# Patient Record
Sex: Male | Born: 1945 | Race: White | Hispanic: No | Smoking: Never smoker
Health system: Southern US, Community
[De-identification: ages and names within clinical notes are randomized; demographics above are authoritative.]

## PROBLEM LIST (undated history)

## (undated) DIAGNOSIS — I872 Venous insufficiency (chronic) (peripheral): Secondary | ICD-10-CM

## (undated) DIAGNOSIS — I2510 Atherosclerotic heart disease of native coronary artery without angina pectoris: Secondary | ICD-10-CM

## (undated) DIAGNOSIS — D126 Benign neoplasm of colon, unspecified: Secondary | ICD-10-CM

## (undated) DIAGNOSIS — E7800 Pure hypercholesterolemia, unspecified: Secondary | ICD-10-CM

## (undated) DIAGNOSIS — R079 Chest pain, unspecified: Secondary | ICD-10-CM

## (undated) DIAGNOSIS — Z79899 Other long term (current) drug therapy: Secondary | ICD-10-CM

## (undated) DIAGNOSIS — I5023 Acute on chronic systolic (congestive) heart failure: Secondary | ICD-10-CM

## (undated) DIAGNOSIS — E118 Type 2 diabetes mellitus with unspecified complications: Secondary | ICD-10-CM

## (undated) DIAGNOSIS — R55 Syncope and collapse: Secondary | ICD-10-CM

## (undated) DIAGNOSIS — G2581 Restless legs syndrome: Secondary | ICD-10-CM

## (undated) DIAGNOSIS — E1142 Type 2 diabetes mellitus with diabetic polyneuropathy: Secondary | ICD-10-CM

## (undated) DIAGNOSIS — I5042 Chronic combined systolic (congestive) and diastolic (congestive) heart failure: Secondary | ICD-10-CM

## (undated) DIAGNOSIS — R5381 Other malaise: Secondary | ICD-10-CM

## (undated) DIAGNOSIS — Z95810 Presence of automatic (implantable) cardiac defibrillator: Secondary | ICD-10-CM

## (undated) DIAGNOSIS — R600 Localized edema: Secondary | ICD-10-CM

## (undated) DIAGNOSIS — I509 Heart failure, unspecified: Secondary | ICD-10-CM

## (undated) DIAGNOSIS — S72143A Displaced intertrochanteric fracture of unspecified femur, initial encounter for closed fracture: Secondary | ICD-10-CM

## (undated) DIAGNOSIS — Z0189 Encounter for other specified special examinations: Secondary | ICD-10-CM

## (undated) DIAGNOSIS — E119 Type 2 diabetes mellitus without complications: Secondary | ICD-10-CM

## (undated) DIAGNOSIS — I10 Essential (primary) hypertension: Secondary | ICD-10-CM

## (undated) DIAGNOSIS — Z96649 Presence of unspecified artificial hip joint: Secondary | ICD-10-CM

## (undated) DIAGNOSIS — Z9114 Patient's other noncompliance with medication regimen: Secondary | ICD-10-CM

## (undated) DIAGNOSIS — G478 Other sleep disorders: Secondary | ICD-10-CM

## (undated) DIAGNOSIS — R609 Edema, unspecified: Secondary | ICD-10-CM

## (undated) DIAGNOSIS — M109 Gout, unspecified: Secondary | ICD-10-CM

## (undated) DIAGNOSIS — I428 Other cardiomyopathies: Secondary | ICD-10-CM

## (undated) DIAGNOSIS — F528 Other sexual dysfunction not due to a substance or known physiological condition: Secondary | ICD-10-CM

## (undated) DIAGNOSIS — S72009A Fracture of unspecified part of neck of unspecified femur, initial encounter for closed fracture: Secondary | ICD-10-CM

## (undated) DIAGNOSIS — I429 Cardiomyopathy, unspecified: Secondary | ICD-10-CM

## (undated) DIAGNOSIS — I8390 Asymptomatic varicose veins of unspecified lower extremity: Secondary | ICD-10-CM

## (undated) DIAGNOSIS — I110 Hypertensive heart disease with heart failure: Secondary | ICD-10-CM

## (undated) DIAGNOSIS — G4730 Sleep apnea, unspecified: Secondary | ICD-10-CM

## (undated) DIAGNOSIS — R0602 Shortness of breath: Secondary | ICD-10-CM

## (undated) DIAGNOSIS — L03115 Cellulitis of right lower limb: Secondary | ICD-10-CM

## (undated) DIAGNOSIS — M7989 Other specified soft tissue disorders: Secondary | ICD-10-CM

## (undated) DIAGNOSIS — Z8619 Personal history of other infectious and parasitic diseases: Secondary | ICD-10-CM

## (undated) DIAGNOSIS — B3789 Other sites of candidiasis: Secondary | ICD-10-CM

## (undated) HISTORY — DX: Cellulitis of right lower limb: L03.115

## (undated) HISTORY — DX: Acute on chronic systolic (congestive) heart failure: I50.23

## (undated) HISTORY — DX: Morbid (severe) obesity due to excess calories: E66.01

## (undated) HISTORY — DX: Chest pain, unspecified: R07.9

## (undated) HISTORY — DX: Other malaise: R53.81

## (undated) HISTORY — DX: Other specified soft tissue disorders: M79.89

## (undated) HISTORY — DX: Patient's other noncompliance with medication regimen: Z91.14

## (undated) HISTORY — DX: Personal history of other infectious and parasitic diseases: Z86.19

## (undated) HISTORY — DX: Chronic combined systolic (congestive) and diastolic (congestive) heart failure: I50.42

## (undated) HISTORY — DX: Other sexual dysfunction not due to a substance or known physiological condition: F52.8

## (undated) HISTORY — DX: Syncope and collapse: R55

## (undated) HISTORY — DX: Other cardiomyopathies: I42.8

## (undated) HISTORY — DX: Other long term (current) drug therapy: Z79.899

## (undated) HISTORY — DX: Cardiomyopathy, unspecified: I42.9

## (undated) HISTORY — DX: Localized edema: R60.0

## (undated) HISTORY — DX: Other sleep disorders: G47.8

## (undated) HISTORY — DX: Heart failure, unspecified: I50.9

## (undated) HISTORY — DX: Asymptomatic varicose veins of unspecified lower extremity: I83.90

## (undated) HISTORY — DX: Fracture of unspecified part of neck of unspecified femur, initial encounter for closed fracture: S72.009A

## (undated) HISTORY — DX: Venous insufficiency (chronic) (peripheral): I87.2

## (undated) HISTORY — DX: Presence of unspecified artificial hip joint: Z96.649

## (undated) HISTORY — DX: Essential (primary) hypertension: I10

## (undated) HISTORY — DX: Restless legs syndrome: G25.81

## (undated) HISTORY — DX: Edema, unspecified: R60.9

## (undated) HISTORY — DX: Type 2 diabetes mellitus with unspecified complications: E11.8

## (undated) HISTORY — DX: Pure hypercholesterolemia, unspecified: E78.00

## (undated) HISTORY — DX: Displaced intertrochanteric fracture of unspecified femur, initial encounter for closed fracture: S72.143A

## (undated) HISTORY — DX: Encounter for other specified special examinations: Z01.89

## (undated) HISTORY — DX: Other sites of candidiasis: B37.89

## (undated) HISTORY — DX: Atherosclerotic heart disease of native coronary artery without angina pectoris: I25.10

## (undated) HISTORY — DX: Benign neoplasm of colon, unspecified: D12.6

## (undated) HISTORY — DX: Hypertensive heart disease with heart failure: I11.0

## (undated) HISTORY — DX: Type 2 diabetes mellitus with diabetic polyneuropathy: E11.42

## (undated) HISTORY — DX: Shortness of breath: R06.02

---

## 2003-03-22 ENCOUNTER — Encounter (INDEPENDENT_AMBULATORY_CARE_PROVIDER_SITE_OTHER): Payer: Self-pay | Admitting: Specialist

## 2003-03-22 ENCOUNTER — Ambulatory Visit (HOSPITAL_COMMUNITY): Admission: RE | Admit: 2003-03-22 | Discharge: 2003-03-22 | Payer: Self-pay | Admitting: Gastroenterology

## 2006-12-20 HISTORY — PX: FIXATION KYPHOPLASTY LUMBAR SPINE: SHX1642

## 2007-11-23 ENCOUNTER — Encounter: Admission: RE | Admit: 2007-11-23 | Discharge: 2007-11-23 | Payer: Self-pay | Admitting: Family Medicine

## 2007-11-27 ENCOUNTER — Encounter: Admission: RE | Admit: 2007-11-27 | Discharge: 2007-11-27 | Payer: Self-pay | Admitting: Family Medicine

## 2007-12-04 ENCOUNTER — Ambulatory Visit (HOSPITAL_COMMUNITY): Admission: RE | Admit: 2007-12-04 | Discharge: 2007-12-04 | Payer: Self-pay | Admitting: Radiology

## 2007-12-21 HISTORY — PX: CATARACT EXTRACTION W/ INTRAOCULAR LENS  IMPLANT, BILATERAL: SHX1307

## 2007-12-25 ENCOUNTER — Encounter: Admission: RE | Admit: 2007-12-25 | Discharge: 2007-12-25 | Payer: Self-pay | Admitting: Obstetrics and Gynecology

## 2010-02-17 ENCOUNTER — Encounter: Admission: RE | Admit: 2010-02-17 | Discharge: 2010-05-18 | Payer: Self-pay | Admitting: Endocrinology

## 2010-06-16 ENCOUNTER — Encounter: Admission: RE | Admit: 2010-06-16 | Discharge: 2010-06-16 | Payer: Self-pay | Admitting: Endocrinology

## 2010-08-01 ENCOUNTER — Encounter: Payer: Self-pay | Admitting: Emergency Medicine

## 2010-08-03 ENCOUNTER — Encounter (INDEPENDENT_AMBULATORY_CARE_PROVIDER_SITE_OTHER): Payer: Self-pay | Admitting: Internal Medicine

## 2010-09-09 ENCOUNTER — Encounter
Admission: RE | Admit: 2010-09-09 | Discharge: 2010-11-18 | Payer: Self-pay | Source: Home / Self Care | Attending: Endocrinology | Admitting: Endocrinology

## 2010-10-14 ENCOUNTER — Encounter: Admission: RE | Admit: 2010-10-14 | Discharge: 2010-10-14 | Payer: Self-pay | Admitting: Orthopedic Surgery

## 2010-11-26 ENCOUNTER — Inpatient Hospital Stay (HOSPITAL_COMMUNITY): Admission: EM | Admit: 2010-11-26 | Discharge: 2010-08-04 | Payer: Self-pay | Admitting: Surgery

## 2010-12-03 ENCOUNTER — Ambulatory Visit: Admission: RE | Admit: 2010-12-03 | Payer: Self-pay | Source: Home / Self Care | Admitting: Orthopedic Surgery

## 2011-01-09 ENCOUNTER — Encounter: Payer: Self-pay | Admitting: Orthopedic Surgery

## 2011-03-05 LAB — GLUCOSE, CAPILLARY
Glucose-Capillary: 103 mg/dL — ABNORMAL HIGH (ref 70–99)
Glucose-Capillary: 104 mg/dL — ABNORMAL HIGH (ref 70–99)
Glucose-Capillary: 112 mg/dL — ABNORMAL HIGH (ref 70–99)
Glucose-Capillary: 120 mg/dL — ABNORMAL HIGH (ref 70–99)
Glucose-Capillary: 173 mg/dL — ABNORMAL HIGH (ref 70–99)
Glucose-Capillary: 76 mg/dL (ref 70–99)
Glucose-Capillary: 80 mg/dL (ref 70–99)
Glucose-Capillary: 183 mg/dL — ABNORMAL HIGH (ref 70–99)
Glucose-Capillary: 218 mg/dL — ABNORMAL HIGH (ref 70–99)
Glucose-Capillary: 233 mg/dL — ABNORMAL HIGH (ref 70–99)
Glucose-Capillary: 244 mg/dL — ABNORMAL HIGH (ref 70–99)
Glucose-Capillary: 229 mg/dL — ABNORMAL HIGH (ref 70–99)
Glucose-Capillary: 313 mg/dL — ABNORMAL HIGH (ref 70–99)

## 2011-03-05 LAB — COMPREHENSIVE METABOLIC PANEL
ALT: 35 U/L (ref 0–53)
ALT: 38 U/L (ref 0–53)
AST: 52 U/L — ABNORMAL HIGH (ref 0–37)
Alkaline Phosphatase: 97 U/L (ref 39–117)
BUN: 38 mg/dL — ABNORMAL HIGH (ref 6–23)
CO2: 29 mEq/L (ref 19–32)
CO2: 25 mEq/L (ref 19–32)
Calcium: 9.1 mg/dL (ref 8.4–10.5)
Calcium: 9.8 mg/dL (ref 8.4–10.5)
GFR calc Af Amer: >60 mL/min (ref >60)
GFR calc non Af Amer: 41 mL/min — ABNORMAL LOW (ref >60)
Glucose, Bld: 289 mg/dL — ABNORMAL HIGH (ref 70–99)
Potassium: 5.3 mEq/L — ABNORMAL HIGH (ref 3.5–5.1)
Sodium: 138 mEq/L (ref 135–145)
Sodium: 137 mEq/L (ref 135–145)
Total Protein: 6.2 g/dL (ref 6.0–8.3)

## 2011-03-05 LAB — CBC
HCT: 35.9 % — ABNORMAL LOW (ref 39.0–52.0)
HCT: 39.3 % (ref 39.0–52.0)
HCT: 43 % (ref 39.0–52.0)
Hemoglobin: 11.8 g/dL — ABNORMAL LOW (ref 13.0–17.0)
Hemoglobin: 13.5 g/dL (ref 13.0–17.0)
Hemoglobin: 14.7 g/dL (ref 13.0–17.0)
MCH: 28 pg (ref 26.0–34.0)
MCH: 29 pg (ref 26.0–34.0)
MCH: 28.7 pg (ref 26.0–34.0)
MCHC: 32.9 g/dL (ref 30.0–36.0)
MCHC: 34.4 g/dL (ref 30.0–36.0)
MCHC: 34.1 g/dL (ref 30.0–36.0)
MCV: 85.3 fL (ref 78.0–100.0)
MCV: 84.3 fL (ref 78.0–100.0)
MCV: 84.2 fL (ref 78.0–100.0)
Platelets: 142 10*3/uL — ABNORMAL LOW (ref 150–400)
Platelets: 155 10*3/uL (ref 150–400)
RBC: 4.21 MIL/uL — ABNORMAL LOW (ref 4.22–5.81)
RBC: 4.66 MIL/uL (ref 4.22–5.81)
RDW: 13.9 % (ref 11.5–15.5)
RDW: 13.7 % (ref 11.5–15.5)
WBC: 7.6 10*3/uL (ref 4.0–10.5)
WBC: 9.5 10*3/uL (ref 4.0–10.5)

## 2011-03-05 LAB — URINE CULTURE
Colony Count: NO GROWTH
Culture  Setup Time: 201108140011

## 2011-03-05 LAB — BASIC METABOLIC PANEL
BUN: 22 mg/dL (ref 6–23)
BUN: 33 mg/dL — ABNORMAL HIGH (ref 6–23)
CO2: 26 mEq/L (ref 19–32)
CO2: 29 mEq/L (ref 19–32)
Calcium: 8.1 mg/dL — ABNORMAL LOW (ref 8.4–10.5)
Calcium: 8.3 mg/dL — ABNORMAL LOW (ref 8.4–10.5)
Chloride: 103 mEq/L (ref 96–112)
Chloride: 104 mEq/L (ref 96–112)
Creatinine, Ser: 0.71 mg/dL (ref 0.4–1.5)
Creatinine, Ser: 0.86 mg/dL (ref 0.4–1.5)
GFR calc Af Amer: >60 mL/min (ref >60)
GFR calc Af Amer: >60 mL/min (ref >60)
GFR calc non Af Amer: >60 mL/min (ref >60)
GFR calc non Af Amer: >60 mL/min (ref >60)
Glucose, Bld: 113 mg/dL — ABNORMAL HIGH (ref 70–99)
Glucose, Bld: 183 mg/dL — ABNORMAL HIGH (ref 70–99)
Potassium: 5 mEq/L (ref 3.5–5.1)
Potassium: 5.1 mEq/L (ref 3.5–5.1)
Sodium: 135 mEq/L (ref 135–145)
Sodium: 139 mEq/L (ref 135–145)

## 2011-03-05 LAB — URINALYSIS, ROUTINE W REFLEX MICROSCOPIC
Glucose, UA: 100 mg/dL — AB
pH: 5 (ref 5.0–8.0)

## 2011-03-05 LAB — POTASSIUM: Potassium: 5.3 mEq/L — ABNORMAL HIGH (ref 3.5–5.1)

## 2011-03-05 LAB — DIFFERENTIAL
Basophils Relative: 1 % (ref 0–1)
Basophils Relative: 0 % (ref 0–1)
Eosinophils Absolute: 0.6 10*3/uL (ref 0.0–0.7)
Eosinophils Absolute: 0.4 10*3/uL (ref 0.0–0.7)
Eosinophils Relative: 6 % — ABNORMAL HIGH (ref 0–5)
Lymphs Abs: 1.3 10*3/uL (ref 0.7–4.0)
Lymphs Abs: 1.3 10*3/uL (ref 0.7–4.0)
Monocytes Relative: 15 % — ABNORMAL HIGH (ref 3–12)
Neutrophils Relative %: 74 % (ref 43–77)

## 2011-03-05 LAB — PROTIME-INR
INR: 1.11 (ref 0.00–1.49)
Prothrombin Time: 14.5 seconds (ref 11.6–15.2)

## 2011-03-05 LAB — MRSA PCR SCREENING: MRSA by PCR: NEGATIVE

## 2011-03-05 LAB — TROPONIN I: Troponin I: 0.01 ng/mL (ref 0.00–0.06)

## 2011-05-07 NOTE — Op Note (Signed)
NAME:  Jeffery Nelson, Jeffery Nelson                         ACCOUNT NO.:  [id number]   MEDICAL RECORD NO.:  [id number]                   PATIENT TYPE:  AMB   LOCATION:  ENDO                                 FACILITY:   PHYSICIAN:  Anselmo Rod, M.D.               DATE OF BIRTH:  1946/02/11   DATE OF PROCEDURE:  03/22/2003  DATE OF DISCHARGE:                                 OPERATIVE REPORT   PROCEDURE:  Colonoscopy with snare polypectomy x4 and cold biopsies x2.   ENDOSCOPIST:  Charna Elizabeth, M.D.   INSTRUMENT USED:  Olympus video colonoscope.   INDICATIONS FOR PROCEDURE:  65 year old white male with a history of  diabetes and change in bowel habits undergoing screening colonoscopy to rule  out colonic polyps, masses, etc.   PRE-PROCEDURE PREPARATION:  Informed consent was procured from the patient.  The patient was fasted for eight hours prior to the procedure and prepped  with a bottle of magnesium citrate and a gallon of GOLYTELY the night prior  to the procedure.   PRE-PROCEDURE PHYSICAL:  VITAL SIGNS:  The patient had stable vital signs.  NECK:  Supple.  CHEST:  Clear to auscultation.  CARDIAC:  S1 and S2 regular.  ABDOMEN:  Soft with normal bowel sounds.   DESCRIPTION OF PROCEDURE:  The patient was placed in the left lateral  decubitus position and sedated with 50 mg of Demerol and 5 mg of Versed  intravenously.  Once the patient was adequately sedated, maintained on low  flow oxygen, and continuous cardiac monitoring, the Olympus video  colonoscope was advanced from the rectum to the cecum with difficulty.  There was some residual stool in the colon and multiple washes were done.  Two small sessile polyps were snared from the mid right colon.  Three small  sessile polyps were present in the left colon.  Two of them were removed by  snare polypectomy, one was removed by cold biopsy forceps x2.  Two pairs  were seen in the right colon.  Retroflexion in the rectum revealed no  acute  abnormality.  The patient tolerated the procedure well without  complications.   IMPRESSION:  1. Multiple colonic polyps removed (see description above).  2. Some residual stool in the colon.  Small lesions could have been missed.    RECOMMENDATIONS:  1. Avoid all nonsteroidals including aspirin for now.  2. Await pathology results.  3. Outpatient followup in the next two weeks for further recommendations.                                                 Anselmo Rod, M.D.    JNM/MEDQ  D:  03/22/2003  T:  03/24/2003  Job:  [id number]   cc:   Marjory Lies, M.D.  [address]  [place]   [address]  Fax: [phone]

## 2011-07-12 ENCOUNTER — Other Ambulatory Visit: Payer: Self-pay | Admitting: Family Medicine

## 2011-07-13 ENCOUNTER — Ambulatory Visit
Admission: RE | Admit: 2011-07-13 | Discharge: 2011-07-13 | Disposition: A | Discharge status: Home / Self Care | Payer: Medicare Other | Source: Ambulatory Visit | Attending: Family Medicine | Admitting: Family Medicine

## 2011-08-13 ENCOUNTER — Other Ambulatory Visit (HOSPITAL_COMMUNITY): Payer: Self-pay | Admitting: Gastroenterology

## 2011-09-02 ENCOUNTER — Encounter (HOSPITAL_COMMUNITY)
Admission: RE | Admit: 2011-09-02 | Discharge: 2011-09-02 | Disposition: A | Discharge status: Home / Self Care | Payer: Medicare Other | Source: Ambulatory Visit | Attending: Gastroenterology | Admitting: Gastroenterology

## 2011-09-02 DIAGNOSIS — R1011 Right upper quadrant pain: Secondary | ICD-10-CM | POA: Insufficient documentation

## 2011-09-02 MED ORDER — TECHNETIUM TC 99M MEBROFENIN IV KIT
5.5000 | PACK | Freq: Once | INTRAVENOUS | Status: AC | PRN
  Administered 2011-09-02: 6 via INTRAVENOUS

## 2011-09-22 ENCOUNTER — Other Ambulatory Visit: Payer: Self-pay | Admitting: Gastroenterology

## 2011-09-22 ENCOUNTER — Ambulatory Visit (HOSPITAL_COMMUNITY)
Admission: RE | Admit: 2011-09-22 | Discharge: 2011-09-22 | Disposition: A | Discharge status: Home / Self Care | Payer: Medicare Other | Source: Ambulatory Visit | Attending: Gastroenterology | Admitting: Gastroenterology

## 2011-09-22 DIAGNOSIS — E119 Type 2 diabetes mellitus without complications: Secondary | ICD-10-CM | POA: Insufficient documentation

## 2011-09-22 DIAGNOSIS — D126 Benign neoplasm of colon, unspecified: Secondary | ICD-10-CM | POA: Insufficient documentation

## 2011-09-22 DIAGNOSIS — I10 Essential (primary) hypertension: Secondary | ICD-10-CM | POA: Insufficient documentation

## 2011-09-22 DIAGNOSIS — Z1211 Encounter for screening for malignant neoplasm of colon: Secondary | ICD-10-CM | POA: Insufficient documentation

## 2011-09-24 LAB — CBC
Platelets: ADEQUATE
RDW: 14.9

## 2011-09-24 LAB — COMPREHENSIVE METABOLIC PANEL
ALT: 30
Albumin: 3.7
Alkaline Phosphatase: 139 — ABNORMAL HIGH
Potassium: 4.2
Sodium: 136
Total Protein: 7.1

## 2011-09-24 LAB — PROTIME-INR: INR: 1.1

## 2012-09-04 HISTORY — PX: TRANSTHORACIC ECHOCARDIOGRAM: SHX275

## 2012-09-04 HISTORY — PX: OTHER SURGICAL HISTORY: SHX169

## 2012-11-27 ENCOUNTER — Ambulatory Visit: Payer: Medicare Other | Admitting: *Deleted

## 2012-12-07 ENCOUNTER — Encounter: Payer: Self-pay | Admitting: *Deleted

## 2013-10-03 ENCOUNTER — Encounter: Payer: Self-pay | Admitting: Internal Medicine

## 2013-10-04 ENCOUNTER — Encounter: Payer: Self-pay | Admitting: Internal Medicine

## 2013-10-04 ENCOUNTER — Ambulatory Visit (INDEPENDENT_AMBULATORY_CARE_PROVIDER_SITE_OTHER): Payer: Medicare Other | Admitting: Internal Medicine

## 2013-10-04 VITALS — BP 150/96 | HR 75 | Ht 66.0 in | Wt 305.7 lb

## 2013-10-04 DIAGNOSIS — R0609 Other forms of dyspnea: Secondary | ICD-10-CM

## 2013-10-04 DIAGNOSIS — I8390 Asymptomatic varicose veins of unspecified lower extremity: Secondary | ICD-10-CM

## 2013-10-04 DIAGNOSIS — I83893 Varicose veins of bilateral lower extremities with other complications: Secondary | ICD-10-CM

## 2013-10-04 DIAGNOSIS — Z0189 Encounter for other specified special examinations: Secondary | ICD-10-CM

## 2013-10-04 DIAGNOSIS — Z8619 Personal history of other infectious and parasitic diseases: Secondary | ICD-10-CM | POA: Insufficient documentation

## 2013-10-04 DIAGNOSIS — G4710 Hypersomnia, unspecified: Secondary | ICD-10-CM

## 2013-10-04 DIAGNOSIS — R0683 Snoring: Secondary | ICD-10-CM

## 2013-10-04 DIAGNOSIS — I10 Essential (primary) hypertension: Secondary | ICD-10-CM

## 2013-10-04 HISTORY — DX: Morbid (severe) obesity due to excess calories: E66.01

## 2013-10-04 HISTORY — DX: Encounter for other specified special examinations: Z01.89

## 2013-10-04 HISTORY — DX: Asymptomatic varicose veins of unspecified lower extremity: I83.90

## 2013-10-04 HISTORY — DX: Personal history of other infectious and parasitic diseases: Z86.19

## 2013-10-04 NOTE — Progress Notes (Signed)
OFFICE NOTE  Chief Complaint:  Routine follow-up  Primary Care Physician: Delorse Lek, MD  HPI:  Jeffery Nelson is a 67 year old gentleman referred to me for lower extremity edema and shortness of breath. As you know, he is morbidly obese, almost super morbidly obese with a weight of 330 pounds, BMI of 54. He underwent an echocardiogram which demonstrated mild concentric LVH, EF greater than 55%. The left atrium was mildly dilated. However, there were no significant valvular abnormalities. Pulmonary pressures were not elevated. He also underwent lower extremity Dopplers looking for venous insufficiency. There was a small amount of reflux in the right common femoral vein, which is a deep vein. However, the superficial veins were negative for reflux or treatable venous insufficiency. I reviewed the results with the patient today and I feel that his best option is to continue with lower extremity compression stockings and work on weight loss. He is also at high risk for sleep apnea and I have referred him for a sleep study - unfortunately he never made this appointment.  Currently has no other symptoms other than he related a recent bout of shingles that he underwent. This has since resolved and he was not left with any chronic pain.  PMHx:  Past Medical History  Diagnosis Date  . Shortness of breath   . Dyspnea on exertion   . Hypertension   . Lower extremity edema   . Type II or unspecified type diabetes mellitus with unspecified complication, not stated as uncontrolled   . Benign neoplasm of colon   . Chest pain     From rib fractures  . Polyneuropathy in diabetes(357.2)   . Pure hypercholesterolemia   . Psychosexual dysfunction with inhibited sexual excitement   . Restless legs syndrome (RLS)   . Sleep arousal disorder   . Unspecified venous (peripheral) insufficiency     Past Surgical History  Procedure Laterality Date  . Lower extremity venous doppler  09/04/2012    Mild  valvular insufficiency in the R Common Femoral vein w/ 1.6 sec of duration of refliux  . Transthoracic echocardiogram  09/04/2012    EF >55%, normal    FAMHx:  Family History  Problem Relation Age of Onset  . Heart disease Mother   . Heart disease Father   . Diabetes Brother   . Heart disease Maternal Grandmother   . Heart disease Maternal Grandfather   . Stroke Paternal Grandmother   . Cancer Paternal Grandfather     Skin cancer    SOCHx:   reports that he has never smoked. He has never used smokeless tobacco. He reports that he does not drink alcohol or use illicit drugs.  ALLERGIES:  Allergies  Allergen Reactions  . Lipitor [Atorvastatin]   . Lyrica [Pregabalin]     ROS: A comprehensive review of systems was negative except for: Respiratory: positive for dyspnea on exertion and snoring  HOME MEDS: Current Outpatient Prescriptions  Medication Sig Dispense Refill  . Ascorbic Acid (VITAMIN C PO) Take by mouth daily.      [name] aspirin EC 81 MG tablet Take 81 mg by mouth daily.      . Calcium Carbonate-Vitamin D (CALCIUM + D PO) Take 1 tablet by mouth daily.      . colchicine 0.6 MG tablet Take 0.6 mg by mouth as needed.      . Cyanocobalamin (VITAMIN B-12 CR PO) Take by mouth daily.      [name] FLUoxetine (PROZAC) 20 MG tablet Take  20 mg by mouth daily.      . furosemide (LASIX) 20 MG tablet Take 20 mg by mouth daily.      . insulin lispro protamine-lispro (HUMALOG 75/25) (75-25) 100 UNIT/ML SUSP injection Inject 75 Units into the skin 3 (three) times daily.      [name] MAGNESIUM PO Take 1 tablet by mouth daily.      . Multiple Vitamins-Minerals (ZINC PO) Take 1 tablet by mouth daily.      . Potassium 99 MG TABS Take 1 tablet by mouth 2 (two) times daily.      . quinapril (ACCUPRIL) 20 MG tablet Take 20 mg by mouth daily.      [name] rOPINIRole (REQUIP) 4 MG tablet Take 4 mg by mouth at bedtime.      . rosuvastatin (CRESTOR) 20 MG tablet Take 20 mg by mouth daily.       No current  facility-administered medications for this visit.    LABS/IMAGING: No results found for this or any previous visit (from the past 48 hour(s)). No results found.  VITALS: BP 150/96  Pulse 75  Ht 5\' 6"  (1.676 m)  Wt 305 lb 11.2 oz (138.665 kg)  BMI 49.36 kg/m2  EXAM: General appearance: alert and no distress Neck: no carotid bruit and no JVD Lungs: clear to auscultation bilaterally Heart: regular rate and rhythm, S1, S2 normal, no murmur, click, rub or gallop Abdomen: soft, non-tender; bowel sounds normal; no masses,  no organomegaly and morbidly obese Extremities: extremities with 1+ RLE and trace LLE edema, there is a pressure sore in the right achilles area Pulses: 2+ and symmetric Skin: Skin color, texture, turgor normal. No rashes or lesions Neurologic: Grossly normal Psych: Pleasant, somewhat slow mentation  EKG: Normal sinus rhythm at 75  ASSESSMENT: 1. Morbid obesity 2. Bilateral deep vein reflux with edema, right greater than left 3. Probable obstructive sleep apnea  PLAN: 1.   [name] was referred for sleep study which he never obtained. He said this was due to insurance issues. He would like to get the study now, but after the first of the year. He does need better risk factor control, as he reported his recent A1c was either 9 or 10. His blood pressure is slightly high today and should be watched and his medications adjusted accordingly. I recommend he continue the stockings for his lower extremity edema. Will go ahead and refer him again for sleep study. Plan to see him back in 6 months or sooner. He is encouraged to followup with his primary care provider and adhere to his medical regimen.  Chrystie Nose, MD, [facility] Attending Cardiologist [facility]  HILTY,Kenneth C 10/04/2013, 6:24 PM

## 2013-10-04 NOTE — Patient Instructions (Signed)
Your physician recommends that you schedule a follow-up appointment in: 6 months  Your physician has recommended that you have a sleep study. This test records several body functions during sleep, including: brain activity, eye movement, oxygen and carbon dioxide blood levels, heart rate and rhythm, breathing rate and rhythm, the flow of air through your mouth and nose, snoring, body muscle movements, and chest and belly movement.

## 2013-10-25 ENCOUNTER — Other Ambulatory Visit: Payer: Self-pay

## 2014-09-21 ENCOUNTER — Emergency Department (HOSPITAL_COMMUNITY): Payer: Medicare Other

## 2014-09-21 ENCOUNTER — Encounter (HOSPITAL_COMMUNITY): Payer: Medicare Other | Admitting: Certified Registered"

## 2014-09-21 ENCOUNTER — Inpatient Hospital Stay (HOSPITAL_COMMUNITY): Payer: Medicare Other | Admitting: Certified Registered"

## 2014-09-21 ENCOUNTER — Inpatient Hospital Stay (HOSPITAL_COMMUNITY): Payer: Medicare Other

## 2014-09-21 ENCOUNTER — Encounter (HOSPITAL_COMMUNITY): Payer: Self-pay | Admitting: Emergency Medicine

## 2014-09-21 ENCOUNTER — Encounter (HOSPITAL_COMMUNITY)
Admission: EM | Disposition: A | Discharge status: Rehabilitation Facility | Payer: Self-pay | Source: Home / Self Care | Attending: Internal Medicine

## 2014-09-21 ENCOUNTER — Inpatient Hospital Stay (HOSPITAL_COMMUNITY)
Admission: EM | Admit: 2014-09-21 | Discharge: 2014-09-27 | DRG: 481 | Disposition: A | Discharge status: Rehabilitation Facility | Payer: Medicare Other | Attending: Internal Medicine | Admitting: Internal Medicine

## 2014-09-21 DIAGNOSIS — E11649 Type 2 diabetes mellitus with hypoglycemia without coma: Secondary | ICD-10-CM | POA: Diagnosis present

## 2014-09-21 DIAGNOSIS — W1830XA Fall on same level, unspecified, initial encounter: Secondary | ICD-10-CM | POA: Diagnosis present

## 2014-09-21 DIAGNOSIS — S72142A Displaced intertrochanteric fracture of left femur, initial encounter for closed fracture: Secondary | ICD-10-CM | POA: Diagnosis present

## 2014-09-21 DIAGNOSIS — Z0189 Encounter for other specified special examinations: Secondary | ICD-10-CM

## 2014-09-21 DIAGNOSIS — E1142 Type 2 diabetes mellitus with diabetic polyneuropathy: Secondary | ICD-10-CM | POA: Diagnosis present

## 2014-09-21 DIAGNOSIS — G2581 Restless legs syndrome: Secondary | ICD-10-CM | POA: Diagnosis present

## 2014-09-21 DIAGNOSIS — I890 Lymphedema, not elsewhere classified: Secondary | ICD-10-CM | POA: Diagnosis present

## 2014-09-21 DIAGNOSIS — Z8249 Family history of ischemic heart disease and other diseases of the circulatory system: Secondary | ICD-10-CM | POA: Diagnosis not present

## 2014-09-21 DIAGNOSIS — S72009A Fracture of unspecified part of neck of unspecified femur, initial encounter for closed fracture: Secondary | ICD-10-CM

## 2014-09-21 DIAGNOSIS — E871 Hypo-osmolality and hyponatremia: Secondary | ICD-10-CM | POA: Diagnosis not present

## 2014-09-21 DIAGNOSIS — E669 Obesity, unspecified: Secondary | ICD-10-CM | POA: Diagnosis present

## 2014-09-21 DIAGNOSIS — D62 Acute posthemorrhagic anemia: Secondary | ICD-10-CM | POA: Diagnosis not present

## 2014-09-21 DIAGNOSIS — S72002A Fracture of unspecified part of neck of left femur, initial encounter for closed fracture: Secondary | ICD-10-CM

## 2014-09-21 DIAGNOSIS — M25552 Pain in left hip: Secondary | ICD-10-CM | POA: Diagnosis present

## 2014-09-21 DIAGNOSIS — S20212A Contusion of left front wall of thorax, initial encounter: Secondary | ICD-10-CM | POA: Diagnosis present

## 2014-09-21 DIAGNOSIS — Z889 Allergy status to unspecified drugs, medicaments and biological substances status: Secondary | ICD-10-CM | POA: Diagnosis not present

## 2014-09-21 DIAGNOSIS — E119 Type 2 diabetes mellitus without complications: Secondary | ICD-10-CM

## 2014-09-21 DIAGNOSIS — G4733 Obstructive sleep apnea (adult) (pediatric): Secondary | ICD-10-CM | POA: Diagnosis present

## 2014-09-21 DIAGNOSIS — I10 Essential (primary) hypertension: Secondary | ICD-10-CM | POA: Diagnosis present

## 2014-09-21 DIAGNOSIS — Z6841 Body Mass Index (BMI) 40.0 and over, adult: Secondary | ICD-10-CM | POA: Diagnosis not present

## 2014-09-21 DIAGNOSIS — Z23 Encounter for immunization: Secondary | ICD-10-CM

## 2014-09-21 DIAGNOSIS — Z833 Family history of diabetes mellitus: Secondary | ICD-10-CM | POA: Diagnosis not present

## 2014-09-21 DIAGNOSIS — Z794 Long term (current) use of insulin: Secondary | ICD-10-CM

## 2014-09-21 DIAGNOSIS — Z8619 Personal history of other infectious and parasitic diseases: Secondary | ICD-10-CM

## 2014-09-21 HISTORY — DX: Fracture of unspecified part of neck of unspecified femur, initial encounter for closed fracture: S72.009A

## 2014-09-21 HISTORY — PX: INTRAMEDULLARY (IM) NAIL INTERTROCHANTERIC: SHX5875

## 2014-09-21 LAB — CBC
HCT: 40.4 % (ref 39.0–52.0)
Hemoglobin: 13.4 g/dL (ref 13.0–17.0)
MCH: 27.9 pg (ref 26.0–34.0)
MCHC: 33.2 g/dL (ref 30.0–36.0)
MCV: 84 fL (ref 78.0–100.0)
Platelets: 133 10*3/uL — ABNORMAL LOW (ref 150–400)
RBC: 4.81 MIL/uL (ref 4.22–5.81)
RDW: 13.6 % (ref 11.5–15.5)
WBC: 11.2 10*3/uL — ABNORMAL HIGH (ref 4.0–10.5)

## 2014-09-21 LAB — CBC WITH DIFFERENTIAL/PLATELET
BASOS PCT: 0 % (ref 0–1)
Basophils Absolute: 0.1 10*3/uL (ref 0.0–0.1)
Eosinophils Absolute: 0 10*3/uL (ref 0.0–0.7)
Eosinophils Relative: 0 % (ref 0–5)
HCT: 42.3 % (ref 39.0–52.0)
Hemoglobin: 14.5 g/dL (ref 13.0–17.0)
Lymphocytes Relative: 6 % — ABNORMAL LOW (ref 12–46)
Lymphs Abs: 0.8 10*3/uL (ref 0.7–4.0)
MCH: 28.9 pg (ref 26.0–34.0)
MCHC: 34.3 g/dL (ref 30.0–36.0)
MCV: 84.3 fL (ref 78.0–100.0)
Monocytes Absolute: 0.9 10*3/uL (ref 0.1–1.0)
Monocytes Relative: 7 % (ref 3–12)
NEUTROS ABS: 11.3 10*3/uL — AB (ref 1.7–7.7)
Neutrophils Relative %: 87 % — ABNORMAL HIGH (ref 43–77)
Platelets: 140 10*3/uL — ABNORMAL LOW (ref 150–400)
RBC: 5.02 MIL/uL (ref 4.22–5.81)
RDW: 13.6 % (ref 11.5–15.5)
WBC: 13.1 10*3/uL — AB (ref 4.0–10.5)

## 2014-09-21 LAB — HEPATIC FUNCTION PANEL
ALBUMIN: 3.8 g/dL (ref 3.5–5.2)
ALT: 18 U/L (ref 0–53)
AST: 21 U/L (ref 0–37)
Alkaline Phosphatase: 107 U/L (ref 39–117)
Total Bilirubin: 0.8 mg/dL (ref 0.3–1.2)
Total Protein: 6.7 g/dL (ref 6.0–8.3)

## 2014-09-21 LAB — TYPE AND SCREEN
ABO/RH(D): A POS
Antibody Screen: NEGATIVE

## 2014-09-21 LAB — BASIC METABOLIC PANEL
Anion gap: 13 (ref 5–15)
BUN: 17 mg/dL (ref 6–23)
CHLORIDE: 99 meq/L (ref 96–112)
CO2: 27 mEq/L (ref 19–32)
CREATININE: 0.63 mg/dL (ref 0.50–1.35)
Calcium: 8.6 mg/dL (ref 8.4–10.5)
GFR calc non Af Amer: >90 mL/min (ref >90)
Glucose, Bld: 157 mg/dL — ABNORMAL HIGH (ref 70–99)
POTASSIUM: 4 meq/L (ref 3.7–5.3)
SODIUM: 139 meq/L (ref 137–147)

## 2014-09-21 LAB — HEMOGLOBIN A1C
HEMOGLOBIN A1C: 7 % — AB (ref <5.7)
Mean Plasma Glucose: 154 mg/dL — ABNORMAL HIGH (ref <117)

## 2014-09-21 LAB — CREATININE, SERUM
CREATININE: 0.62 mg/dL (ref 0.50–1.35)
GFR calc Af Amer: >90 mL/min (ref >90)

## 2014-09-21 LAB — ABO/RH: ABO/RH(D): A POS

## 2014-09-21 LAB — TSH: TSH: 1.62 u[IU]/mL (ref 0.350–4.500)

## 2014-09-21 LAB — TROPONIN I

## 2014-09-21 LAB — PROTIME-INR
INR: 1.11 (ref 0.00–1.49)
Prothrombin Time: 14.3 seconds (ref 11.6–15.2)

## 2014-09-21 LAB — GLUCOSE, CAPILLARY
GLUCOSE-CAPILLARY: 133 mg/dL — AB (ref 70–99)
GLUCOSE-CAPILLARY: 154 mg/dL — AB (ref 70–99)
Glucose-Capillary: 244 mg/dL — ABNORMAL HIGH (ref 70–99)

## 2014-09-21 LAB — PRO B NATRIURETIC PEPTIDE: Pro B Natriuretic peptide (BNP): 382.8 pg/mL — ABNORMAL HIGH (ref 0–125)

## 2014-09-21 SURGERY — FIXATION, FRACTURE, INTERTROCHANTERIC, WITH INTRAMEDULLARY ROD
Anesthesia: General | Site: Hip | Laterality: Left

## 2014-09-21 MED ORDER — SCOPOLAMINE 1 MG/3DAYS TD PT72
1.0000 | MEDICATED_PATCH | Freq: Once | TRANSDERMAL | Status: DC
  Administered 2014-09-21: 1.5 mg via TRANSDERMAL

## 2014-09-21 MED ORDER — HYDROMORPHONE HCL 1 MG/ML IJ SOLN
0.2500 mg | INTRAMUSCULAR | Status: DC | PRN
  Administered 2014-09-21 (×4): 0.5 mg via INTRAVENOUS

## 2014-09-21 MED ORDER — SODIUM CHLORIDE 0.9 % IJ SOLN
INTRAMUSCULAR | Status: AC
  Filled 2014-09-21: qty 10

## 2014-09-21 MED ORDER — NEOSTIGMINE METHYLSULFATE 10 MG/10ML IV SOLN
INTRAVENOUS | Status: DC | PRN
  Administered 2014-09-21: 4 mg via INTRAVENOUS

## 2014-09-21 MED ORDER — ACETAMINOPHEN 650 MG RE SUPP: 650.0000 mg | Freq: Four times a day (QID) | RECTAL | Status: DC | PRN

## 2014-09-21 MED ORDER — GLYCOPYRROLATE 0.2 MG/ML IJ SOLN
INTRAMUSCULAR | Status: AC
  Filled 2014-09-21: qty 4

## 2014-09-21 MED ORDER — ROCURONIUM BROMIDE 50 MG/5ML IV SOLN
INTRAVENOUS | Status: AC
  Filled 2014-09-21: qty 1

## 2014-09-21 MED ORDER — 0.9 % SODIUM CHLORIDE (POUR BTL) OPTIME
TOPICAL | Status: DC | PRN
  Administered 2014-09-21: 1000 mL

## 2014-09-21 MED ORDER — INSULIN ASPART PROT & ASPART (70-30 MIX) 100 UNIT/ML ~~LOC~~ SUSP
75.0000 [IU] | Freq: Two times a day (BID) | SUBCUTANEOUS | Status: DC
  Filled 2014-09-21: qty 10

## 2014-09-21 MED ORDER — EPHEDRINE SULFATE 50 MG/ML IJ SOLN
INTRAMUSCULAR | Status: DC | PRN
  Administered 2014-09-21 (×2): 5 mg via INTRAVENOUS

## 2014-09-21 MED ORDER — MENTHOL 3 MG MT LOZG
1.0000 | LOZENGE | OROMUCOSAL | Status: DC | PRN
  Filled 2014-09-21: qty 9

## 2014-09-21 MED ORDER — LEVALBUTEROL HCL 0.63 MG/3ML IN NEBU
0.6300 mg | INHALATION_SOLUTION | Freq: Four times a day (QID) | RESPIRATORY_TRACT | Status: DC | PRN
  Filled 2014-09-21: qty 3

## 2014-09-21 MED ORDER — FUROSEMIDE 20 MG PO TABS
20.0000 mg | ORAL_TABLET | Freq: Every day | ORAL | Status: DC
  Administered 2014-09-22 – 2014-09-26 (×5): 20 mg via ORAL
  Filled 2014-09-21 (×7): qty 1

## 2014-09-21 MED ORDER — INSULIN ASPART 100 UNIT/ML ~~LOC~~ SOLN: 0.0000 [IU] | Freq: Three times a day (TID) | SUBCUTANEOUS | Status: DC

## 2014-09-21 MED ORDER — FLUOXETINE HCL 20 MG PO CAPS
20.0000 mg | ORAL_CAPSULE | Freq: Every day | ORAL | Status: DC
Start: 2014-09-21 — End: 2014-09-27
  Administered 2014-09-22 – 2014-09-26 (×5): 20 mg via ORAL
  Filled 2014-09-21 (×7): qty 1

## 2014-09-21 MED ORDER — PROPOFOL 10 MG/ML IV BOLUS
INTRAVENOUS | Status: DC | PRN
  Administered 2014-09-21: 140 mg via INTRAVENOUS

## 2014-09-21 MED ORDER — SUCCINYLCHOLINE CHLORIDE 20 MG/ML IJ SOLN
INTRAMUSCULAR | Status: AC
  Filled 2014-09-21: qty 1

## 2014-09-21 MED ORDER — HYDROMORPHONE HCL 1 MG/ML IJ SOLN
0.5000 mg | Freq: Once | INTRAMUSCULAR | Status: AC
  Administered 2014-09-21: 0.5 mg via INTRAVENOUS
  Filled 2014-09-21: qty 1

## 2014-09-21 MED ORDER — OXYCODONE HCL 5 MG/5ML PO SOLN: 5.0000 mg | Freq: Once | ORAL | Status: DC | PRN

## 2014-09-21 MED ORDER — PHENYLEPHRINE HCL 10 MG/ML IJ SOLN
INTRAMUSCULAR | Status: DC | PRN
  Administered 2014-09-21 (×2): 40 ug via INTRAVENOUS
  Administered 2014-09-21 (×2): 80 ug via INTRAVENOUS
  Administered 2014-09-21 (×2): 40 ug via INTRAVENOUS

## 2014-09-21 MED ORDER — SODIUM CHLORIDE 0.9 % IV SOLN: INTRAVENOUS | Status: DC

## 2014-09-21 MED ORDER — OXYCODONE HCL 5 MG PO TABS
ORAL_TABLET | ORAL | Status: AC
  Administered 2014-09-21: 10 mg via ORAL
  Filled 2014-09-21: qty 2

## 2014-09-21 MED ORDER — ACETAMINOPHEN 325 MG PO TABS
650.0000 mg | ORAL_TABLET | Freq: Four times a day (QID) | ORAL | Status: DC | PRN
  Administered 2014-09-21: 650 mg via ORAL
  Filled 2014-09-21: qty 2

## 2014-09-21 MED ORDER — ATORVASTATIN CALCIUM 10 MG PO TABS: 10.0000 mg | ORAL_TABLET | Freq: Every day | ORAL | Status: DC

## 2014-09-21 MED ORDER — ONDANSETRON HCL 4 MG/2ML IJ SOLN: 4.0000 mg | Freq: Four times a day (QID) | INTRAMUSCULAR | Status: DC | PRN

## 2014-09-21 MED ORDER — INSULIN ASPART 100 UNIT/ML ~~LOC~~ SOLN
0.0000 [IU] | Freq: Every day | SUBCUTANEOUS | Status: DC
  Administered 2014-09-21: 2 [IU] via SUBCUTANEOUS

## 2014-09-21 MED ORDER — ROCURONIUM BROMIDE 100 MG/10ML IV SOLN
INTRAVENOUS | Status: DC | PRN
  Administered 2014-09-21: 30 mg via INTRAVENOUS

## 2014-09-21 MED ORDER — LACTATED RINGERS IV SOLN
INTRAVENOUS | Status: DC
  Administered 2014-09-21: 14:00:00 via INTRAVENOUS

## 2014-09-21 MED ORDER — QUINAPRIL HCL 10 MG PO TABS
20.0000 mg | ORAL_TABLET | Freq: Every day | ORAL | Status: DC
  Administered 2014-09-22 – 2014-09-26 (×5): 20 mg via ORAL
  Filled 2014-09-21 (×7): qty 2

## 2014-09-21 MED ORDER — ROPINIROLE HCL 1 MG PO TABS
4.0000 mg | ORAL_TABLET | Freq: Every day | ORAL | Status: DC
  Administered 2014-09-21 – 2014-09-26 (×6): 4 mg via ORAL
  Filled 2014-09-21 (×8): qty 4

## 2014-09-21 MED ORDER — FENTANYL CITRATE 0.05 MG/ML IJ SOLN
INTRAMUSCULAR | Status: DC | PRN
  Administered 2014-09-21: 50 ug via INTRAVENOUS
  Administered 2014-09-21: 150 ug via INTRAVENOUS
  Administered 2014-09-21: 50 ug via INTRAVENOUS

## 2014-09-21 MED ORDER — SCOPOLAMINE 1 MG/3DAYS TD PT72
MEDICATED_PATCH | TRANSDERMAL | Status: AC
  Filled 2014-09-21: qty 1

## 2014-09-21 MED ORDER — ACETAMINOPHEN 325 MG PO TABS
650.0000 mg | ORAL_TABLET | Freq: Four times a day (QID) | ORAL | Status: DC | PRN
  Filled 2014-09-21: qty 2

## 2014-09-21 MED ORDER — HYDROMORPHONE HCL 1 MG/ML IJ SOLN
INTRAMUSCULAR | Status: AC
  Administered 2014-09-21: 0.5 mg via INTRAVENOUS
  Filled 2014-09-21: qty 2

## 2014-09-21 MED ORDER — HYDROMORPHONE HCL 1 MG/ML IJ SOLN
1.0000 mg | Freq: Once | INTRAMUSCULAR | Status: AC
Start: 2014-09-21 — End: 2014-09-21
  Administered 2014-09-21: 1 mg via INTRAVENOUS
  Filled 2014-09-21: qty 1

## 2014-09-21 MED ORDER — LACTATED RINGERS IV SOLN
INTRAVENOUS | Status: DC | PRN
  Administered 2014-09-21 (×2): via INTRAVENOUS

## 2014-09-21 MED ORDER — SODIUM CHLORIDE 0.9 % IJ SOLN: 3.0000 mL | Freq: Two times a day (BID) | INTRAMUSCULAR | Status: DC

## 2014-09-21 MED ORDER — NEOSTIGMINE METHYLSULFATE 10 MG/10ML IV SOLN
INTRAVENOUS | Status: AC
  Filled 2014-09-21: qty 1

## 2014-09-21 MED ORDER — ROSUVASTATIN CALCIUM 20 MG PO TABS
20.0000 mg | ORAL_TABLET | Freq: Every day | ORAL | Status: DC
  Administered 2014-09-22 – 2014-09-26 (×5): 20 mg via ORAL
  Filled 2014-09-21 (×7): qty 1

## 2014-09-21 MED ORDER — HYDROMORPHONE HCL 1 MG/ML IJ SOLN: 0.5000 mg | INTRAMUSCULAR | Status: DC | PRN

## 2014-09-21 MED ORDER — ACETAMINOPHEN 650 MG RE SUPP
650.0000 mg | Freq: Four times a day (QID) | RECTAL | Status: DC | PRN
  Filled 2014-09-21: qty 1

## 2014-09-21 MED ORDER — ONDANSETRON HCL 4 MG/2ML IJ SOLN
INTRAMUSCULAR | Status: DC | PRN
Start: 2014-09-21 — End: 2014-09-21
  Administered 2014-09-21: 4 mg via INTRAVENOUS

## 2014-09-21 MED ORDER — SODIUM CHLORIDE 0.9 % IJ SOLN: 3.0000 mL | INTRAMUSCULAR | Status: DC | PRN

## 2014-09-21 MED ORDER — LIDOCAINE HCL (CARDIAC) 20 MG/ML IV SOLN
INTRAVENOUS | Status: DC | PRN
  Administered 2014-09-21: 80 mg via INTRAVENOUS

## 2014-09-21 MED ORDER — PHENOL 1.4 % MT LIQD: 1.0000 | OROMUCOSAL | Status: DC | PRN

## 2014-09-21 MED ORDER — DIAZEPAM 5 MG/ML IJ SOLN
5.0000 mg | Freq: Once | INTRAMUSCULAR | Status: AC
  Administered 2014-09-21: 5 mg via INTRAVENOUS
  Filled 2014-09-21: qty 2

## 2014-09-21 MED ORDER — ACETAMINOPHEN 325 MG PO TABS
ORAL_TABLET | ORAL | Status: AC
  Administered 2014-09-21: 650 mg via ORAL
  Filled 2014-09-21: qty 2

## 2014-09-21 MED ORDER — DOCUSATE SODIUM 100 MG PO CAPS
100.0000 mg | ORAL_CAPSULE | Freq: Two times a day (BID) | ORAL | Status: DC
  Administered 2014-09-21 – 2014-09-26 (×10): 100 mg via ORAL
  Filled 2014-09-21 (×16): qty 1

## 2014-09-21 MED ORDER — ENOXAPARIN SODIUM 40 MG/0.4ML ~~LOC~~ SOLN
40.0000 mg | SUBCUTANEOUS | Status: DC
  Filled 2014-09-21: qty 0.4

## 2014-09-21 MED ORDER — SUCCINYLCHOLINE CHLORIDE 20 MG/ML IJ SOLN
INTRAMUSCULAR | Status: DC | PRN
  Administered 2014-09-21: 100 mg via INTRAVENOUS

## 2014-09-21 MED ORDER — OXYCODONE HCL 5 MG PO TABS
5.0000 mg | ORAL_TABLET | ORAL | Status: DC | PRN
Start: 2014-09-21 — End: 2014-09-27
  Administered 2014-09-21 – 2014-09-27 (×13): 10 mg via ORAL
  Filled 2014-09-21 (×13): qty 2

## 2014-09-21 MED ORDER — INSULIN ASPART 100 UNIT/ML ~~LOC~~ SOLN
0.0000 [IU] | Freq: Three times a day (TID) | SUBCUTANEOUS | Status: DC
  Administered 2014-09-22: 3 [IU] via SUBCUTANEOUS
  Administered 2014-09-22: 2 [IU] via SUBCUTANEOUS
  Administered 2014-09-22: 3 [IU] via SUBCUTANEOUS
  Administered 2014-09-23 – 2014-09-24 (×4): 2 [IU] via SUBCUTANEOUS
  Administered 2014-09-24: 1 [IU] via SUBCUTANEOUS
  Administered 2014-09-25: 2 [IU] via SUBCUTANEOUS
  Administered 2014-09-25: 1 [IU] via SUBCUTANEOUS
  Administered 2014-09-25 – 2014-09-26 (×2): 2 [IU] via SUBCUTANEOUS
  Administered 2014-09-26: 1 [IU] via SUBCUTANEOUS
  Administered 2014-09-26 – 2014-09-27 (×2): 2 [IU] via SUBCUTANEOUS

## 2014-09-21 MED ORDER — OXYCODONE HCL 5 MG PO TABS: 5.0000 mg | ORAL_TABLET | Freq: Once | ORAL | Status: DC | PRN

## 2014-09-21 MED ORDER — COLCHICINE 0.6 MG PO TABS: 0.6000 mg | ORAL_TABLET | ORAL | Status: DC | PRN

## 2014-09-21 MED ORDER — SODIUM CHLORIDE 0.9 % IV SOLN: 250.0000 mL | INTRAVENOUS | Status: DC | PRN

## 2014-09-21 MED ORDER — POTASSIUM GLUCONATE 595 (99 K) MG PO TABS: 595.0000 mg | ORAL_TABLET | Freq: Every day | ORAL | Status: DC

## 2014-09-21 MED ORDER — PROPOFOL 10 MG/ML IV BOLUS
INTRAVENOUS | Status: AC
  Filled 2014-09-21: qty 20

## 2014-09-21 MED ORDER — ONDANSETRON HCL 4 MG PO TABS: 4.0000 mg | ORAL_TABLET | Freq: Four times a day (QID) | ORAL | Status: DC | PRN

## 2014-09-21 MED ORDER — GLYCOPYRROLATE 0.2 MG/ML IJ SOLN
INTRAMUSCULAR | Status: DC | PRN
  Administered 2014-09-21: .7 mg via INTRAVENOUS

## 2014-09-21 MED ORDER — DEXTROSE 5 % IV SOLN
3.0000 g | INTRAVENOUS | Status: AC
  Administered 2014-09-21: 3 g via INTRAVENOUS
  Filled 2014-09-21: qty 3000

## 2014-09-21 MED ORDER — FENTANYL CITRATE 0.05 MG/ML IJ SOLN
INTRAMUSCULAR | Status: AC
  Filled 2014-09-21: qty 5

## 2014-09-21 MED ORDER — HYDROMORPHONE HCL 1 MG/ML IJ SOLN: 1.0000 mg | INTRAMUSCULAR | Status: DC | PRN

## 2014-09-21 SURGICAL SUPPLY — 44 items
BIT DRILL CANN LG 4.3MM (BIT) ×1 IMPLANT
BLADE SURG 15 STRL LF DISP TIS (BLADE) ×1 IMPLANT
BLADE SURG 15 STRL SS (BLADE) ×2
CANISTER SUCT 3000ML (MISCELLANEOUS) ×3 IMPLANT
CHLORAPREP W/TINT 26ML (MISCELLANEOUS) IMPLANT
COVER MAYO STAND STRL (DRAPES) ×3 IMPLANT
COVER PERINEAL POST (MISCELLANEOUS) ×3 IMPLANT
COVER SURGICAL LIGHT HANDLE (MISCELLANEOUS) ×3 IMPLANT
DRAPE STERI IOBAN 125X83 (DRAPES) ×3 IMPLANT
DRAPE U-SHAPE 47X51 STRL (DRAPES) ×3 IMPLANT
DRILL BIT CANN LG 4.3MM (BIT) ×3
DRSG MEPILEX BORDER 4X4 (GAUZE/BANDAGES/DRESSINGS) ×3 IMPLANT
DRSG MEPILEX BORDER 4X8 (GAUZE/BANDAGES/DRESSINGS) ×3 IMPLANT
DURAPREP 26ML APPLICATOR (WOUND CARE) ×3 IMPLANT
ELECT REM PT RETURN 9FT ADLT (ELECTROSURGICAL) ×3
ELECTRODE REM PT RTRN 9FT ADLT (ELECTROSURGICAL) ×1 IMPLANT
GLOVE BIO SURGEON STRL SZ7 (GLOVE) ×3 IMPLANT
GLOVE BIO SURGEON STRL SZ8 (GLOVE) ×6 IMPLANT
GLOVE BIOGEL PI IND STRL 8 (GLOVE) ×1 IMPLANT
GLOVE BIOGEL PI INDICATOR 8 (GLOVE) ×2
GOWN STRL REUS W/ TWL LRG LVL3 (GOWN DISPOSABLE) ×1 IMPLANT
GOWN STRL REUS W/ TWL XL LVL3 (GOWN DISPOSABLE) ×2 IMPLANT
GOWN STRL REUS W/TWL LRG LVL3 (GOWN DISPOSABLE) ×2
GOWN STRL REUS W/TWL XL LVL3 (GOWN DISPOSABLE) ×4
HIP FR NAIL LAG SCREW 10.5X110 (Orthopedic Implant) ×3 IMPLANT
KIT BASIN OR (CUSTOM PROCEDURE TRAY) ×3 IMPLANT
KIT ROOM TURNOVER OR (KITS) ×3 IMPLANT
LINER BOOT UNIVERSAL DISP (MISCELLANEOUS) ×3 IMPLANT
NAIL HIP FRACT 130D 11X180 (Screw) ×3 IMPLANT
NS IRRIG 1000ML POUR BTL (IV SOLUTION) ×3 IMPLANT
PACK GENERAL/GYN (CUSTOM PROCEDURE TRAY) ×3 IMPLANT
PAD ARMBOARD 7.5X6 YLW CONV (MISCELLANEOUS) ×6 IMPLANT
PIN GUIDE 3.2 903003004 (MISCELLANEOUS) ×3 IMPLANT
SCREW BONE CORTICAL 5.0X36 (Screw) ×3 IMPLANT
SCREW LAG HIP FR NAIL 10.5X110 (Orthopedic Implant) ×1 IMPLANT
SPONGE LAP 18X18 X RAY DECT (DISPOSABLE) ×3 IMPLANT
STAPLER VISISTAT 35W (STAPLE) ×6 IMPLANT
SUT ETHILON 3 0 PS 1 (SUTURE) ×3 IMPLANT
SUT MNCRL AB 3-0 PS2 18 (SUTURE) IMPLANT
SUT MON AB 2-0 CT1 36 (SUTURE) ×3 IMPLANT
SUT VIC AB 0 CT1 27 (SUTURE) ×2
SUT VIC AB 0 CT1 27XBRD ANBCTR (SUTURE) ×1 IMPLANT
TRAY FOLEY CATH 16FRSI W/METER (SET/KITS/TRAYS/PACK) IMPLANT
WATER STERILE IRR 1000ML POUR (IV SOLUTION) ×3 IMPLANT

## 2014-09-21 NOTE — Transfer of Care (Signed)
Immediate Anesthesia Transfer of Care Note  Patient: Jeffery Nelson  Procedure(s) Performed: Procedure(s): left hip intertroch fracture (Left)  Patient Location: PACU  Anesthesia Type:General  Level of Consciousness: awake, alert  and oriented  Airway & Oxygen Therapy: Patient Spontanous Breathing and Patient connected to face mask oxygen  Post-op Assessment: Report given to PACU RN, Post -op Vital signs reviewed and stable and Patient moving all extremities X 4  Post vital signs: Reviewed and stable  Complications: No apparent anesthesia complications

## 2014-09-21 NOTE — H&P (Signed)
[facility] History and Physical  Jeffery Nelson PZW:[id number] DOB: 24-Mar-1946 DOA: 09/21/2014  Referring physician: ER PCP: Stephens Shire, MD   Chief Complaint: Fall   HPI:  68 year old male who presents to the ED after a fall and left hip pain. The patient was putting sheets and the washer when he fell and landed on his left hip. Had difficulty weightbearing after the fall and came to the ED for further evaluation. The patient was found to have minimally displaced left-sided intertrochanteric femur fracture. Denies any pain, somewhat hypertensive with systolic blood pressure in the 170s today. Denies hitting his head or loss of consciousness. Denies any recent intercurrent illness, denies any anginal chest pain-like symptoms or shortness of breath. EKG showed sinus rhythm. Last known BMI of 54  Previous cardiac history includes , echocardiogram which demonstrated mild concentric LVH, EF greater than 55%. The left atrium was mildly dilated. No significant valvular abnormalities. Pulmonary pressures were not elevated. Previous lower extremity Dopplers looking for venous insufficiency. Next compression stockings. Probable Obstructive sleep apnea      Review of Systems: negative for the following  Constitutional: Denies fever, chills, diaphoresis, appetite change and fatigue.  HEENT: Denies photophobia, eye pain, redness, hearing loss, ear pain, congestion, sore throat, rhinorrhea, sneezing, mouth sores, trouble swallowing, neck pain, neck stiffness and tinnitus.  Respiratory: Denies SOB, DOE, cough, chest tightness, and wheezing.  Cardiovascular: Denies chest pain, palpitations and leg swelling.  Gastrointestinal: Denies nausea, vomiting, abdominal pain, diarrhea, constipation, blood in stool and abdominal distention.  Genitourinary: Denies dysuria, urgency, frequency, hematuria, flank pain and difficulty urinating.  Musculoskeletal: Denies myalgias, back pain, positive hip pain,  joint swelling, arthralgias and positive for gait problem.  Skin: Denies pallor, rash and wound.  Neurological: Denies dizziness, seizures, syncope, weakness, light-headedness, numbness and headaches.  Hematological: Denies adenopathy. Easy bruising, personal or family bleeding history  Psychiatric/Behavioral: Denies suicidal ideation, mood changes, confusion, nervousness, sleep disturbance and agitation       Past Medical History  Diagnosis Date  . Shortness of breath   . Dyspnea on exertion   . Hypertension   . Lower extremity edema   . Type II or unspecified type diabetes mellitus with unspecified complication, not stated as uncontrolled   . Benign neoplasm of colon   . Chest pain     From rib fractures  . Polyneuropathy in diabetes(357.2)   . Pure hypercholesterolemia   . Psychosexual dysfunction with inhibited sexual excitement   . Restless legs syndrome (RLS)   . Sleep arousal disorder   . Unspecified venous (peripheral) insufficiency      Past Surgical History  Procedure Laterality Date  . Lower extremity venous doppler  09/04/2012    Mild valvular insufficiency in the R Common Femoral vein w/ 1.6 sec of duration of refliux  . Transthoracic echocardiogram  09/04/2012    EF >55%, normal      Social History:  reports that he has never smoked. He has never used smokeless tobacco. He reports that he does not drink alcohol or use illicit drugs.    Allergies  Allergen Reactions  . Lipitor [Atorvastatin]   . Lyrica [Pregabalin]     Family History  Problem Relation Age of Onset  . Heart disease Mother   . Heart disease Father   . Diabetes Brother   . Heart disease Maternal Grandmother   . Heart disease Maternal Grandfather   . Stroke Paternal Grandmother   . Cancer Paternal Grandfather     Skin cancer  Prior to Admission medications   Medication Sig Start Date End Date Taking? Authorizing Provider  colchicine 0.6 MG tablet Take 0.6 mg by mouth as needed  (for gout).    Yes Historical Provider, MD  fluocinonide cream (LIDEX) 9.67 % Apply 1 application topically as needed (rash).   Yes Historical Provider, MD  FLUoxetine (PROZAC) 20 MG tablet Take 20 mg by mouth daily.   Yes Historical Provider, MD  furosemide (LASIX) 20 MG tablet Take 20 mg by mouth daily.   Yes Historical Provider, MD  insulin lispro protamine-lispro (HUMALOG 75/25) (75-25) 100 UNIT/ML SUSP injection Inject 75 Units into the skin 3 (three) times daily.   Yes Historical Provider, MD  Multiple Vitamin (MULTIVITAMIN WITH MINERALS) TABS tablet Take 1 tablet by mouth daily.   Yes Historical Provider, MD  OVER THE COUNTER MEDICATION Take 1 tablet by mouth daily. Calcium 333 mg with Magnesium 133 mg and Zinc 5 mg tablets   Yes Historical Provider, MD  potassium gluconate 595 MG TABS tablet Take 595 mg by mouth daily.   Yes Historical Provider, MD  quinapril (ACCUPRIL) 20 MG tablet Take 20 mg by mouth daily.   Yes Historical Provider, MD  rOPINIRole (REQUIP) 4 MG tablet Take 4 mg by mouth daily.    Yes Historical Provider, MD  rosuvastatin (CRESTOR) 20 MG tablet Take 20 mg by mouth daily.   Yes Historical Provider, MD     Physical Exam: Filed Vitals:   09/21/14 0845 09/21/14 0945 09/21/14 1015 09/21/14 1030  BP: 176/157 135/76 139/71 154/76  Pulse: 76 68  72  Temp:      TempSrc:      Resp: 13 18 16 17   Height:      Weight:      SpO2: 98% 96%  98%     Constitutional: Vital signs reviewed. Morbidly obese in no acute distress and cooperative with exam. Alert and oriented x3.  Head: Normocephalic and atraumatic  Ear: TM normal bilaterally  Mouth: no erythema or exudates, MMM  Eyes: PERRL, EOMI, conjunctivae normal, No scleral icterus.  Neck: Supple, Trachea midline normal ROM, No JVD, mass, thyromegaly, or carotid bruit present.  Cardiovascular: RRR, S1 normal, S2 normal, no MRG, pulses symmetric and intact bilaterally  Pulmonary/Chest: CTAB, no wheezes, rales, or rhonchi   Abdominal: Soft. Non-tender, non-distended, bowel sounds are normal, no masses, organomegaly, or guarding present.  GU: no CVA tenderness Musculoskeletal: Pt noted to have shortening and external rotation of L leg Ext: no edema and no cyanosis, pulses palpable bilaterally (DP and PT)  Hematology: no cervical, inginal, or axillary adenopathy.  Neurological: A&O x3, Strenght is normal and symmetric bilaterally, cranial nerve II-XII are grossly intact, no focal motor deficit, sensory intact to light touch bilaterally.  Skin: Warm, dry and intact. No rash, cyanosis, or clubbing.  Psychiatric: Normal mood and affect. speech and behavior is normal. Judgment and thought content normal. Cognition and memory are normal.       Labs on Admission:    Basic Metabolic Panel:  Recent Labs Lab 09/21/14 0957  NA 139  K 4.0  CL 99  CO2 27  GLUCOSE 157*  BUN 17  CREATININE 0.63  CALCIUM 8.6   Liver Function Tests: No results found for this basename: AST, ALT, ALKPHOS, BILITOT, PROT, ALBUMIN,  in the last 168 hours No results found for this basename: LIPASE, AMYLASE,  in the last 168 hours No results found for this basename: AMMONIA,  in the last 168 hours CBC:  Recent Labs Lab 09/21/14 0957  WBC 13.1*  NEUTROABS 11.3*  HGB 14.5  HCT 42.3  MCV 84.3  PLT 140*   Cardiac Enzymes: No results found for this basename: CKTOTAL, CKMB, CKMBINDEX, TROPONINI,  in the last 168 hours  BNP (last 3 results) No results found for this basename: PROBNP,  in the last 8760 hours    CBG: No results found for this basename: GLUCAP,  in the last 168 hours  Radiological Exams on Admission: Dg Ribs Unilateral W/chest Left  09/21/2014   CLINICAL DATA:  Post fall, now with left lower anterior rib pain. History diabetes. Initial encounter.  EXAM: LEFT RIBS AND CHEST - 3+ VIEW  COMPARISON:  Chest radiograph- 08/04/2010; 08/02/2010  FINDINGS: Examination is degraded due to patient body habitus.  Grossly  unchanged enlarged cardiac silhouette and mediastinal contours given persistently reduced lung volumes. There is unchanged elevation of the right hemi now overall improved aeration of the lung bases with persistent right infrahilar opacities. No pleural effusion or pneumothorax. No evidence of edema.  No definite displaced left-sided rib fractures with special attention paid to the area demarcated by the radiopaque BB. Regional soft tissues appear normal. No radiopaque foreign body.  Post L2 vertebroplasty/kyphoplasty.  Known comminuted left-sided intertrochanteric femur fracture is suboptimally evaluated  IMPRESSION: 1. No definite acute cardiopulmonary disease on this degraded hypoventilated examination. Specifically, no definite displaced left-sided rib fractures with special attention paid to the radiopaque BB. 2. Chronic elevation of the right hemidiaphragm with associated right infrahilar opacities, likely atelectasis. 3. Known comminuted left-sided intertrochanteric femur fracture, incompletely evaluated. Please refer to dedicated left femur and pelvis radiographs performed earlier same day.   Electronically Signed   By: Sandi Mariscal M.D.   On: 09/21/2014 10:15   Dg Pelvis 1-2 Views  09/21/2014   CLINICAL DATA:  Left hip pain.  Initial encounter.  EXAM: PELVIS - 1-2 VIEW  COMPARISON:  Left hip radiograph-earlier same day  FINDINGS: There is a comminuted and displaced intertrochanteric left-sided femur fracture with associated foreshortening and mild varus angulation. No definite intra-articular extension. No definite displaced pelvic fracture. A bone island is noted within the anterior aspect of the right ilium limited visualization the contralateral right hip is normal. Regional soft tissues are normal. No radiopaque foreign body.  IMPRESSION: 1. Comminuted, minimally displaced left-sided intertrochanteric femur fracture. Please refer to dedicated left hip radiographs performed earlier same day. 2. No  definite displaced pelvic fracture.   Electronically Signed   By: Sandi Mariscal M.D.   On: 09/21/2014 10:10   Dg Femur Left  09/21/2014   CLINICAL DATA:  Left hip pain.  Initial encounter.  EXAM: LEFT FEMUR - 2 VIEW  COMPARISON:  80 pelvic radiographs - earlier same day  FINDINGS: There is a comminuted and minimally displaced left-sided intertrochanteric femur fracture with associated foreshortening and varus angulation. No definite intra-articular extension. No dislocation. Minimal amount of expected subcutaneous emphysema. No radiopaque foreign body.  No additional fractures are identified. Limited visualization of the adjacent knee is normal given obliquity. Vascular calcifications are noted at the level of the mid thigh.  IMPRESSION: Comminuted and minimally displaced left-sided intertrochanteric femur fracture with foreshortening and accentuated varus angulation. No definite intra-articular extension.   Electronically Signed   By: Sandi Mariscal M.D.   On: 09/21/2014 10:12    EKG: Independently reviewed.Date/Time: Saturday September 21 2014 08:19:06 EDT  Ventricular Rate: 72  PR Interval:  QRS Duration: 85  QT Interval: 425  QTC Calculation: 465  R Axis: -9  Text Interpretation: LVH with secondary repolarization abnormality     Assessment/Plan Active Problems:   Hip fracture   Comminuted and minimally displaced left-sided intertrochanteric EDP to notify Dr. Para March Will keep the patient n.p.o. Moderate to high risk for surgery given morbid obesity We'll obtain a 2-D echo preop Chest x-ray, troponin, EKG looks okay  Diabetes Transition to insulin 70/30, 75 units  Twice a day Hemoglobin A1c  Morbid obesity Believed was outpatient followup with nutritionist to lose weight    Code Status:   full Family Communication: bedside Disposition Plan: admit   Time spent: 70 mins   [facility] Pager [phone]  If 7PM-7AM, please contact  night-coverage [email or web address] Password [email or web address] 09/21/2014, 11:21 AM

## 2014-09-21 NOTE — Consult Note (Signed)
Reason for Consult:  Left hip pain Referring Physician:  Dr. Blair Hailey is an 68 y.o. male.  HPI:  68 y/o male with PMH of diabetes and sleep apnea fell this morning injuring his left hip.  He was unable to bear weight and presented to the ER.  He c/o sharp pain in the left hip with motion that is less severe when holding still.  No h/o hip injury in the past.  No LOC after the fall.  No recent changes in his health.  He is not a smoker.    Past Medical History  Diagnosis Date  . Shortness of breath   . Dyspnea on exertion   . Hypertension   . Lower extremity edema   . Type II or unspecified type diabetes mellitus with unspecified complication, not stated as uncontrolled   . Benign neoplasm of colon   . Chest pain     From rib fractures  . Polyneuropathy in diabetes(357.2)   . Pure hypercholesterolemia   . Psychosexual dysfunction with inhibited sexual excitement   . Restless legs syndrome (RLS)   . Sleep arousal disorder   . Unspecified venous (peripheral) insufficiency     Past Surgical History  Procedure Laterality Date  . Lower extremity venous doppler  09/04/2012    Mild valvular insufficiency in the R Common Femoral vein w/ 1.6 sec of duration of refliux  . Transthoracic echocardiogram  09/04/2012    EF >55%, normal    Family History  Problem Relation Age of Onset  . Heart disease Mother   . Heart disease Father   . Diabetes Brother   . Heart disease Maternal Grandmother   . Heart disease Maternal Grandfather   . Stroke Paternal Grandmother   . Cancer Paternal Grandfather     Skin cancer   Social History:  reports that he has never smoked. He has never used smokeless tobacco. He reports that he does not drink alcohol or use illicit drugs.  Pt lives alone and is retired.   Allergies:  Allergies  Allergen Reactions  . Lipitor [Atorvastatin]   . Lyrica [Pregabalin]     Medications: I have reviewed the patient's current medications.  Results for  orders placed during the hospital encounter of 09/21/14 (from the past 48 hour(s))  BASIC METABOLIC PANEL     Status: Abnormal   Collection Time    09/21/14  9:57 AM      Result Value Ref Range   Sodium 139  137 - 147 mEq/L   Potassium 4.0  3.7 - 5.3 mEq/L   Chloride 99  96 - 112 mEq/L   CO2 27  19 - 32 mEq/L   Glucose, Bld 157 (*) 70 - 99 mg/dL   BUN 17  6 - 23 mg/dL   Creatinine, Ser 0.63  0.50 - 1.35 mg/dL   Calcium 8.6  8.4 - 10.5 mg/dL   GFR calc non Af Amer >90  >90 mL/min   GFR calc Af Amer >90  >90 mL/min   Comment: (NOTE)     The eGFR has been calculated using the CKD EPI equation.     This calculation has not been validated in all clinical situations.     eGFR's persistently <90 mL/min signify possible Chronic Kidney     Disease.   Anion gap 13  5 - 15  CBC WITH DIFFERENTIAL     Status: Abnormal   Collection Time    09/21/14  9:57 AM  Result Value Ref Range   WBC 13.1 (*) 4.0 - 10.5 K/uL   RBC 5.02  4.22 - 5.81 MIL/uL   Hemoglobin 14.5  13.0 - 17.0 g/dL   HCT 42.3  39.0 - 52.0 %   MCV 84.3  78.0 - 100.0 fL   MCH 28.9  26.0 - 34.0 pg   MCHC 34.3  30.0 - 36.0 g/dL   RDW 13.6  11.5 - 15.5 %   Platelets 140 (*) 150 - 400 K/uL   Neutrophils Relative % 87 (*) 43 - 77 %   Neutro Abs 11.3 (*) 1.7 - 7.7 K/uL   Lymphocytes Relative 6 (*) 12 - 46 %   Lymphs Abs 0.8  0.7 - 4.0 K/uL   Monocytes Relative 7  3 - 12 %   Monocytes Absolute 0.9  0.1 - 1.0 K/uL   Eosinophils Relative 0  0 - 5 %   Eosinophils Absolute 0.0  0.0 - 0.7 K/uL   Basophils Relative 0  0 - 1 %   Basophils Absolute 0.1  0.0 - 0.1 K/uL  PROTIME-INR     Status: None   Collection Time    09/21/14  9:57 AM      Result Value Ref Range   Prothrombin Time 14.3  11.6 - 15.2 seconds   INR 1.11  0.00 - 1.49  TYPE AND SCREEN     Status: None   Collection Time    09/21/14 10:00 AM      Result Value Ref Range   ABO/RH(D) A POS     Antibody Screen NEG     Sample Expiration 09/24/2014    TROPONIN I      Status: None   Collection Time    09/21/14 11:28 AM      Result Value Ref Range   Troponin I <0.30  <0.30 ng/mL   Comment:            Due to the release kinetics of cTnI,     a negative result within the first hours     of the onset of symptoms does not rule out     myocardial infarction with certainty.     If myocardial infarction is still suspected,     repeat the test at appropriate intervals.  CBC     Status: Abnormal   Collection Time    09/21/14 11:28 AM      Result Value Ref Range   WBC 11.2 (*) 4.0 - 10.5 K/uL   RBC 4.81  4.22 - 5.81 MIL/uL   Hemoglobin 13.4  13.0 - 17.0 g/dL   HCT 40.4  39.0 - 52.0 %   MCV 84.0  78.0 - 100.0 fL   MCH 27.9  26.0 - 34.0 pg   MCHC 33.2  30.0 - 36.0 g/dL   RDW 13.6  11.5 - 15.5 %   Platelets 133 (*) 150 - 400 K/uL  CREATININE, SERUM     Status: None   Collection Time    09/21/14 11:28 AM      Result Value Ref Range   Creatinine, Ser 0.62  0.50 - 1.35 mg/dL   GFR calc non Af Amer >90  >90 mL/min   GFR calc Af Amer >90  >90 mL/min   Comment: (NOTE)     The eGFR has been calculated using the CKD EPI equation.     This calculation has not been validated in all clinical situations.     eGFR's persistently <90 mL/min signify  possible Chronic Kidney     Disease.  HEPATIC FUNCTION PANEL     Status: None   Collection Time    09/21/14 11:28 AM      Result Value Ref Range   Total Protein 6.7  6.0 - 8.3 g/dL   Albumin 3.8  3.5 - 5.2 g/dL   AST 21  0 - 37 U/L   ALT 18  0 - 53 U/L   Alkaline Phosphatase 107  39 - 117 U/L   Total Bilirubin 0.8  0.3 - 1.2 mg/dL   Bilirubin, Direct <0.2  0.0 - 0.3 mg/dL   Indirect Bilirubin NOT CALCULATED  0.3 - 0.9 mg/dL  PRO B NATRIURETIC PEPTIDE     Status: Abnormal   Collection Time    09/21/14 11:32 AM      Result Value Ref Range   Pro B Natriuretic peptide (BNP) 382.8 (*) 0 - 125 pg/mL  GLUCOSE, CAPILLARY     Status: Abnormal   Collection Time    09/21/14 12:41 PM      Result Value Ref Range    Glucose-Capillary 133 (*) 70 - 99 mg/dL    Dg Ribs Unilateral W/chest Left  09/21/2014   CLINICAL DATA:  Post fall, now with left lower anterior rib pain. History diabetes. Initial encounter.  EXAM: LEFT RIBS AND CHEST - 3+ VIEW  COMPARISON:  Chest radiograph- 08/04/2010; 08/02/2010  FINDINGS: Examination is degraded due to patient body habitus.  Grossly unchanged enlarged cardiac silhouette and mediastinal contours given persistently reduced lung volumes. There is unchanged elevation of the right hemi now overall improved aeration of the lung bases with persistent right infrahilar opacities. No pleural effusion or pneumothorax. No evidence of edema.  No definite displaced left-sided rib fractures with special attention paid to the area demarcated by the radiopaque BB. Regional soft tissues appear normal. No radiopaque foreign body.  Post L2 vertebroplasty/kyphoplasty.  Known comminuted left-sided intertrochanteric femur fracture is suboptimally evaluated  IMPRESSION: 1. No definite acute cardiopulmonary disease on this degraded hypoventilated examination. Specifically, no definite displaced left-sided rib fractures with special attention paid to the radiopaque BB. 2. Chronic elevation of the right hemidiaphragm with associated right infrahilar opacities, likely atelectasis. 3. Known comminuted left-sided intertrochanteric femur fracture, incompletely evaluated. Please refer to dedicated left femur and pelvis radiographs performed earlier same day.   Electronically Signed   By: Sandi Mariscal M.D.   On: 09/21/2014 10:15   Dg Pelvis 1-2 Views  09/21/2014   CLINICAL DATA:  Left hip pain.  Initial encounter.  EXAM: PELVIS - 1-2 VIEW  COMPARISON:  Left hip radiograph-earlier same day  FINDINGS: There is a comminuted and displaced intertrochanteric left-sided femur fracture with associated foreshortening and mild varus angulation. No definite intra-articular extension. No definite displaced pelvic fracture. A bone  island is noted within the anterior aspect of the right ilium limited visualization the contralateral right hip is normal. Regional soft tissues are normal. No radiopaque foreign body.  IMPRESSION: 1. Comminuted, minimally displaced left-sided intertrochanteric femur fracture. Please refer to dedicated left hip radiographs performed earlier same day. 2. No definite displaced pelvic fracture.   Electronically Signed   By: Sandi Mariscal M.D.   On: 09/21/2014 10:10   Dg Femur Left  09/21/2014   CLINICAL DATA:  Left hip pain.  Initial encounter.  EXAM: LEFT FEMUR - 2 VIEW  COMPARISON:  80 pelvic radiographs - earlier same day  FINDINGS: There is a comminuted and minimally displaced left-sided intertrochanteric femur fracture with  associated foreshortening and varus angulation. No definite intra-articular extension. No dislocation. Minimal amount of expected subcutaneous emphysema. No radiopaque foreign body.  No additional fractures are identified. Limited visualization of the adjacent knee is normal given obliquity. Vascular calcifications are noted at the level of the mid thigh.  IMPRESSION: Comminuted and minimally displaced left-sided intertrochanteric femur fracture with foreshortening and accentuated varus angulation. No definite intra-articular extension.   Electronically Signed   By: Sandi Mariscal M.D.   On: 09-29-14 10:12    ROS:  No CP, SOB, orthopnea.  No recent f/c/n/v.  30# wt loss over the last year with an exercise program.  PE:  Blood pressure 131/78, pulse 86, temperature 98.1 F (36.7 C), temperature source Oral, resp. rate 17, height _0  (1.626 m), weight 122.471 kg (270 lb), SpO2 96.00%. Obese male in nad.  A and O x 4.  Mood and affect normal.  EOMI.  Resp unlabored.  Gait is NWB.  L LE with healthy skin and no lymphadenopathy.  5/5 strength in PF and DF of the ankle.  Sens to LT intact at the dorsal and plantar foot.  2+ PT pulse.  No palpable DP pulse.   Assessment/Plan: L hip  intertroch fracture - to OR for intramedullary nailing of the left hip.  The risks and benefits of the alternative treatment options have been discussed in detail.  The patient wishes to proceed with surgery and specifically understands risks of bleeding, infection, nerve damage, blood clots, need for additional surgery, amputation and death.  Jeffery Nelson 09/29/2014, 12:43 PM

## 2014-09-21 NOTE — ED Notes (Signed)
Dr Allyson Sabal contacted this RN and stated that the patient no longer needs his 2D echo and that he can "go ahead with the surgery."

## 2014-09-21 NOTE — Anesthesia Postprocedure Evaluation (Signed)
  Anesthesia Post-op Note  Patient: Jeffery Nelson  Procedure(s) Performed: Procedure(s): left hip intertroch fracture (Left)  Patient Location: PACU  Anesthesia Type:General  Level of Consciousness: awake and alert   Airway and Oxygen Therapy: Patient Spontanous Breathing  Post-op Pain: mild  Post-op Assessment: Post-op Vital signs reviewed, Patient's Cardiovascular Status Stable and Respiratory Function Stable  Post-op Vital Signs: Reviewed  Filed Vitals:   09/21/14 1730  BP:   Pulse: 94  Temp:   Resp: 17    Complications: No apparent anesthesia complications

## 2014-09-21 NOTE — ED Provider Notes (Signed)
CSN: [id number]     Arrival date & time 09/21/14  8676 History   First MD Initiated Contact with Patient 09/21/14 [phone]     Chief Complaint  Patient presents with  . Fall   Patient is a 68 y.o. male presenting with fall.  Fall Associated symptoms include arthralgias. Pertinent negatives include no chest pain, chills, fatigue, fever, headaches, myalgias, nausea, numbness or vomiting.    Patient is a 68 y.o. Male who presents to the ED with left hip pain after a fall.  Patient states that he was putting his sheets in the washer when he fell and landed on his left side at 5:15 this morning.  Patient states that he is now having left lateral hip pain and left rib pain.  Patient denies hitting his head or LOC. He denies tingling or numbness.  Pain in his hip is exacerbated by movement and weight bearing.  He was unable to stand.  Pain in his ribs is exacerbated by pressure. He denies any shortness of breath or anginal chest pain.   Past Medical History  Diagnosis Date  . Shortness of breath   . Dyspnea on exertion   . Hypertension   . Lower extremity edema   . Type II or unspecified type diabetes mellitus with unspecified complication, not stated as uncontrolled   . Benign neoplasm of colon   . Chest pain     From rib fractures  . Polyneuropathy in diabetes(357.2)   . Pure hypercholesterolemia   . Psychosexual dysfunction with inhibited sexual excitement   . Restless legs syndrome (RLS)   . Sleep arousal disorder   . Unspecified venous (peripheral) insufficiency    Past Surgical History  Procedure Laterality Date  . Lower extremity venous doppler  09/04/2012    Mild valvular insufficiency in the R Common Femoral vein w/ 1.6 sec of duration of refliux  . Transthoracic echocardiogram  09/04/2012    EF >55%, normal   Family History  Problem Relation Age of Onset  . Heart disease Mother   . Heart disease Father   . Diabetes Brother   . Heart disease Maternal Grandmother   . Heart  disease Maternal Grandfather   . Stroke Paternal Grandmother   . Cancer Paternal Grandfather     Skin cancer   History  Substance Use Topics  . Smoking status: Never Smoker   . Smokeless tobacco: Never Used  . Alcohol Use: No    Review of Systems  Constitutional: Negative for fever, chills and fatigue.  Respiratory: Negative for chest tightness and shortness of breath.   Cardiovascular: Negative for chest pain and palpitations.  Gastrointestinal: Negative for nausea, vomiting, diarrhea and constipation.  Musculoskeletal: Positive for arthralgias and gait problem. Negative for myalgias.  Neurological: Negative for dizziness, syncope, numbness and headaches.  All other systems reviewed and are negative.     Allergies  Lipitor and Lyrica  Home Medications   Prior to Admission medications   Medication Sig Start Date End Date Taking? Authorizing Provider  colchicine 0.6 MG tablet Take 0.6 mg by mouth as needed (for gout).    Yes Historical Provider, MD  fluocinonide cream (LIDEX) 4.70 % Apply 1 application topically as needed (rash).   Yes Historical Provider, MD  FLUoxetine (PROZAC) 20 MG tablet Take 20 mg by mouth daily.   Yes Historical Provider, MD  furosemide (LASIX) 20 MG tablet Take 20 mg by mouth daily.   Yes Historical Provider, MD  insulin lispro protamine-lispro (HUMALOG 75/25) (75-25)  100 UNIT/ML SUSP injection Inject 75 Units into the skin 3 (three) times daily.   Yes Historical Provider, MD  Multiple Vitamin (MULTIVITAMIN WITH MINERALS) TABS tablet Take 1 tablet by mouth daily.   Yes Historical Provider, MD  OVER THE COUNTER MEDICATION Take 1 tablet by mouth daily. Calcium 333 mg with Magnesium 133 mg and Zinc 5 mg tablets   Yes Historical Provider, MD  potassium gluconate 595 MG TABS tablet Take 595 mg by mouth daily.   Yes Historical Provider, MD  quinapril (ACCUPRIL) 20 MG tablet Take 20 mg by mouth daily.   Yes Historical Provider, MD  rOPINIRole (REQUIP) 4 MG  tablet Take 4 mg by mouth daily.    Yes Historical Provider, MD  rosuvastatin (CRESTOR) 20 MG tablet Take 20 mg by mouth daily.   Yes Historical Provider, MD   BP 131/78  Pulse 86  Temp(Src) 98.1 F (36.7 C) (Oral)  Resp 17  Ht 5\' 4"  (1.626 m)  Wt 270 lb (122.471 kg)  BMI 46.32 kg/m2  SpO2 96% Physical Exam  Nursing note and vitals reviewed. Constitutional: He is oriented to person, place, and time. He appears well-developed and well-nourished. No distress.  HENT:  Head: Normocephalic and atraumatic.  Mouth/Throat: Oropharynx is clear and moist. No oropharyngeal exudate.  Eyes: Conjunctivae and EOM are normal. Pupils are equal, round, and reactive to light. No scleral icterus.  Neck: Normal range of motion. Neck supple. No JVD present. No thyromegaly present.  Cardiovascular: Normal rate, regular rhythm, normal heart sounds and intact distal pulses.  Exam reveals no gallop and no friction rub.   No murmur heard. Pulmonary/Chest: Effort normal and breath sounds normal. No respiratory distress. He has no wheezes. He has no rales. He exhibits tenderness.  Tenderness over the left inferior anterior ribs with palpation.  NO palpable emphysema or crepitus  Abdominal: Soft. Bowel sounds are normal. He exhibits no distension and no mass. There is no tenderness. There is no rebound and no guarding.  Musculoskeletal:  There is external rotation of the left foot with some shortening of the leg.  There is tenderness to palpation of the left lateral hip and left groin.  Active and passive ROM limited to pain. Sensation of the left leg is appropriate to light touch.  Left DP and PT pulses difficult to palpate but are easily found with bedside doppler and are now marked.  There are left chronic hemosideran deposits of the bilateral shins.    Lymphadenopathy:    He has no cervical adenopathy.  Neurological: He is alert and oriented to person, place, and time. He has normal strength. No cranial nerve  deficit or sensory deficit. Coordination normal.  Skin: Skin is warm and dry. Rash noted. He is not diaphoretic.  Mild erythematous rash of the left groin which is moist and red.  Psychiatric: He has a normal mood and affect. His behavior is normal. Judgment and thought content normal.    ED Course  Procedures (including critical care time) Labs Review Labs Reviewed  BASIC METABOLIC PANEL - Abnormal; Notable for the following:    Glucose, Bld 157 (*)    All other components within normal limits  CBC WITH DIFFERENTIAL - Abnormal; Notable for the following:    WBC 13.1 (*)    Platelets 140 (*)    Neutrophils Relative % 87 (*)    Neutro Abs 11.3 (*)    Lymphocytes Relative 6 (*)    All other components within normal limits  CBC -  Abnormal; Notable for the following:    WBC 11.2 (*)    Platelets 133 (*)    All other components within normal limits  PRO B NATRIURETIC PEPTIDE - Abnormal; Notable for the following:    Pro B Natriuretic peptide (BNP) 382.8 (*)    All other components within normal limits  PROTIME-INR  HEMOGLOBIN A1C  TROPONIN I  CREATININE, SERUM  HEPATIC FUNCTION PANEL  TROPONIN I  TROPONIN I  TSH  URINALYSIS, ROUTINE W REFLEX MICROSCOPIC  TYPE AND SCREEN    Imaging Review Dg Ribs Unilateral W/chest Left  09/21/2014   CLINICAL DATA:  Post fall, now with left lower anterior rib pain. History diabetes. Initial encounter.  EXAM: LEFT RIBS AND CHEST - 3+ VIEW  COMPARISON:  Chest radiograph- 08/04/2010; 08/02/2010  FINDINGS: Examination is degraded due to patient body habitus.  Grossly unchanged enlarged cardiac silhouette and mediastinal contours given persistently reduced lung volumes. There is unchanged elevation of the right hemi now overall improved aeration of the lung bases with persistent right infrahilar opacities. No pleural effusion or pneumothorax. No evidence of edema.  No definite displaced left-sided rib fractures with special attention paid to the area  demarcated by the radiopaque BB. Regional soft tissues appear normal. No radiopaque foreign body.  Post L2 vertebroplasty/kyphoplasty.  Known comminuted left-sided intertrochanteric femur fracture is suboptimally evaluated  IMPRESSION: 1. No definite acute cardiopulmonary disease on this degraded hypoventilated examination. Specifically, no definite displaced left-sided rib fractures with special attention paid to the radiopaque BB. 2. Chronic elevation of the right hemidiaphragm with associated right infrahilar opacities, likely atelectasis. 3. Known comminuted left-sided intertrochanteric femur fracture, incompletely evaluated. Please refer to dedicated left femur and pelvis radiographs performed earlier same day.   Electronically Signed   By: Sandi Mariscal M.D.   On: 09/21/2014 10:15   Dg Pelvis 1-2 Views  09/21/2014   CLINICAL DATA:  Left hip pain.  Initial encounter.  EXAM: PELVIS - 1-2 VIEW  COMPARISON:  Left hip radiograph-earlier same day  FINDINGS: There is a comminuted and displaced intertrochanteric left-sided femur fracture with associated foreshortening and mild varus angulation. No definite intra-articular extension. No definite displaced pelvic fracture. A bone island is noted within the anterior aspect of the right ilium limited visualization the contralateral right hip is normal. Regional soft tissues are normal. No radiopaque foreign body.  IMPRESSION: 1. Comminuted, minimally displaced left-sided intertrochanteric femur fracture. Please refer to dedicated left hip radiographs performed earlier same day. 2. No definite displaced pelvic fracture.   Electronically Signed   By: Sandi Mariscal M.D.   On: 09/21/2014 10:10   Dg Femur Left  09/21/2014   CLINICAL DATA:  Left hip pain.  Initial encounter.  EXAM: LEFT FEMUR - 2 VIEW  COMPARISON:  80 pelvic radiographs - earlier same day  FINDINGS: There is a comminuted and minimally displaced left-sided intertrochanteric femur fracture with associated  foreshortening and varus angulation. No definite intra-articular extension. No dislocation. Minimal amount of expected subcutaneous emphysema. No radiopaque foreign body.  No additional fractures are identified. Limited visualization of the adjacent knee is normal given obliquity. Vascular calcifications are noted at the level of the mid thigh.  IMPRESSION: Comminuted and minimally displaced left-sided intertrochanteric femur fracture with foreshortening and accentuated varus angulation. No definite intra-articular extension.   Electronically Signed   By: Sandi Mariscal M.D.   On: 09/21/2014 10:12     EKG Interpretation   Date/Time:  Saturday September 21 2014 08:19:06 EDT Ventricular Rate:  72  PR Interval:    QRS Duration: 85 QT Interval:  425 QTC Calculation: 465 R Axis:   -9 Text Interpretation:  LVH with secondary repolarization abnormality  Anterior Q waves, possibly due to LVH Poor baseline, likely sinus, non  specific T waves lateral Confirmed by ZAVITZ  MD, JOSHUA ([id number]) on  09/21/2014 8:33:14 AM      MDM   Final diagnoses:  Hip fracture, left, closed, initial encounter  Rib contusion, left, initial encounter  Type 2 diabetes mellitus without complication  Essential hypertension   Patient is a 68 y.o. Male who presents to the ED with mechanical fall.  Physical exam reveals a shortened externally rotated left hip which is neurovascularly intact.  CBC, CMP, PT/INR are unremarkable.  CXR reveals low lung volumes but no consolidations. CXR reveals no visible rib fractures of pneumothoracies.  Plain film of the hip and femur reveals an intertrochanteric fracture.  Patient to be admitted to triad by Dr. Allyson Sabal.  I spoke with her at 11:15 am.  I have consulted [facility] and [facility] as the patient is seen there.  I spoke with Dr. Lorre Nick at 12:18 pm who will come see the patient here in the hospital.      Cherylann Parr, PA-C 09/21/14 1220

## 2014-09-21 NOTE — Op Note (Signed)
Jeffery Nelson, LABRIE NO.:  [id number]  MEDICAL RECORD NO.:  [id number]  LOCATION:  5N23C                        FACILITY:  [facility]  PHYSICIAN:  [name], MD        DATE OF BIRTH:  [birth year]  DATE OF PROCEDURE:  09/21/2014 DATE OF DISCHARGE:                              OPERATIVE REPORT   PREOPERATIVE DIAGNOSIS:  Left hip intertrochanteric fracture.  POSTOPERATIVE DIAGNOSIS:  Left hip intertrochanteric fracture.  PROCEDURE:  Open treatment of left hip intertrochanteric fracture with intramedullary nailing.  SURGEON:  [name], MD  ASSISTANT:  Lorin Mercy, PA-C.  ANESTHESIA:  General.  ESTIMATED BLOOD LOSS:  200 mL.  TOURNIQUET TIME:  0.  COMPLICATIONS:  None apparent.  DISPOSITION:  Extubated, awake, and stable to recovery.  INDICATIONS FOR PROCEDURE:  The patient is a 68 year old male with past medical history significant for diabetes and morbid obesity.  He fell earlier today fracturing his left hip.  He presents now for operative treatment of this left hip intertrochanteric fracture.  He understands the risks and benefits, the alternative treatment options, and elects surgical treatment.  He specifically understands risks of bleeding, infection, nerve damage, blood clots, need for additional surgery, continued pain, amputation, and death.  PROCEDURE IN DETAIL:  After preoperative consent was obtained and the correct operative site was identified, the patient was brought to the operating room supine on a hospital bed.  General anesthesia was induced.  Preoperative antibiotics were administered.  Surgical time-out was taken.  The patient was then moved onto the fracture table.  The right lower extremity was positioned on a padded well leg holder.  The left lower extremity was prepped and draped in standard sterile fashion with the foot held in a traction boot.  Prior to prepping and draping, a reduction maneuver was performed with  abduction and traction followed by internal rotation and adduction.  AP and lateral radiographs confirmed appropriate reduction of the fracture.  After the shower curtain was placed, a longitudinal incision was made over the hip adjacent to the tip of the greater trochanter.  Sharp dissection was carried down through the skin and subcutaneous tissue.  The gluteal fascia was incised.  The guidepin was inserted into the tip of the greater trochanter after AP and lateral radiographs confirmed appropriate positioning.  A starter reamer was then used to open the medullary canal.  A short Biomet AFFIXUS nail was then inserted.  The targeting guide was used to align the jig with the femoral neck in the AP and lateral planes.  The guidepin was inserted into the center-center position of the femoral head.  The guidepin was then overreamed and a lag screw was inserted.  Traction was released and the fracture site was compressed.  The lag screw was then locked into place allowing it to slide in compression.  AP and lateral radiographs confirmed appropriate position of the lag screw and the nail.  The targeting jig was then used to insert a distal interlocking screw in percutaneous fashion.  Final AP and lateral radiographs confirmed appropriate position and length of all hardware and appropriate reduction of the fracture site.  Wounds were irrigated copiously.  Deep  subcutaneous tissue and fascia were closed with simple sutures of 0 Vicryl.  Superficial subcutaneous tissues were approximated with inverted simple sutures of 3-0 Monocryl and a running 3-0 nylon was used to close the skin incisions.  Sterile dressings were applied.  The patient was then awakened from anesthesia and transported to the recovery room in stable condition.  FOLLOWUP PLAN:  The patient will be touch-down weightbearing on the left lower extremity.  He will have DVT prophylaxis with Lovenox starting tomorrow.  He will have  physical therapy and occupational therapy consultations for discharge planning.  Lorin Mercy, PA-C, was present, scrubbed for the duration of the case. Her assistance was essential in positioning the patient as well as prepping and draping, performing the operation, closing and dressing the wounds.     [name], MD     JH/MEDQ  D:  09/21/2014  T:  09/21/2014  Job:  [id number]

## 2014-09-21 NOTE — ED Notes (Signed)
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 CBG 110, BP 173/98. Pt sts he has not taken medications today. Pt noted to have shortening and external rotation of L leg.

## 2014-09-21 NOTE — Progress Notes (Signed)
Took watch, cellphone, 2 earrings to PACU in labeled bag. Notified PACU staff of same.

## 2014-09-21 NOTE — ED Notes (Signed)
Patient transported to X-ray 

## 2014-09-21 NOTE — Anesthesia Preprocedure Evaluation (Addendum)
Anesthesia Evaluation  Patient identified by MRN, date of birth, ID band Patient awake    Reviewed: Allergy & Precautions, H&P , NPO status , Patient's Chart, lab work & pertinent test results  Airway Mallampati: II TM Distance: >3 FB Neck ROM: Full    Dental no notable dental hx. (+) Poor Dentition, Dental Advisory Given   Pulmonary neg pulmonary ROS,  breath sounds clear to auscultation  Pulmonary exam normal       Cardiovascular hypertension, Pt. on medications Rhythm:Regular Rate:Normal     Neuro/Psych negative neurological ROS  negative psych ROS   GI/Hepatic negative GI ROS, Neg liver ROS,   Endo/Other  diabetes, Insulin Dependent, Oral Hypoglycemic AgentsMorbid obesity  Renal/GU negative Renal ROS  negative genitourinary   Musculoskeletal   Abdominal   Peds  Hematology negative hematology ROS (+)   Anesthesia Other Findings   Reproductive/Obstetrics negative OB ROS                          Anesthesia Physical Anesthesia Plan  ASA: III  Anesthesia Plan: General   Post-op Pain Management:    Induction: Intravenous, Rapid sequence and Cricoid pressure planned  Airway Management Planned: Oral ETT  Additional Equipment:   Intra-op Plan:   Post-operative Plan: Extubation in OR  Informed Consent: I have reviewed the patients History and Physical, chart, labs and discussed the procedure including the risks, benefits and alternatives for the proposed anesthesia with the patient or authorized representative who has indicated his/her understanding and acceptance.   Dental advisory given  Plan Discussed with: CRNA  Anesthesia Plan Comments:        Anesthesia Quick Evaluation

## 2014-09-21 NOTE — Brief Op Note (Signed)
09/21/2014  4:21 PM  PATIENT:  Jeffery Nelson  68 y.o. male  PRE-OPERATIVE DIAGNOSIS:  Left hip intertroch fracture  POST-OPERATIVE DIAGNOSIS:  Left hip intertrochanteric fracture  Procedure(s):  Open treatment of left hip intertrochanteric fracture with intramedullary nailing  SURGEON:  [name], MD  ASSISTANT: Lorin Mercy, PA-C  ANESTHESIA:   General  EBL:  200 cc  TOURNIQUET:  N/a  COMPLICATIONS:  None apparent  DISPOSITION:  Extubated, awake and stable to recovery.  DICTATION ID:  [id number]

## 2014-09-21 NOTE — ED Provider Notes (Signed)
Medical screening examination/treatment/procedure(s) were conducted as a shared visit with non-physician practitioner(s) or resident and myself. I personally evaluated the patient during the encounter and agree with the findings.  I have personally reviewed any xrays and/ or EKG's with the provider and I agree with interpretation.  is 68 year old male with mechanical fall and left hip pain. On exam patient has significant tenderness left lateral hip, short and left leg and difficulty with specific strength testing due to pain. Neurovascularly intact lower extremities. No other significant injury. X-ray reviewed showing hip fracture. Blood work pending admission place by physician assistant the hospitalist.  Dg Ribs Unilateral W/chest Left  09/21/2014   CLINICAL DATA:  Post fall, now with left lower anterior rib pain. History diabetes. Initial encounter.  EXAM: LEFT RIBS AND CHEST - 3+ VIEW  COMPARISON:  Chest radiograph- 08/04/2010; 08/02/2010  FINDINGS: Examination is degraded due to patient body habitus.  Grossly unchanged enlarged cardiac silhouette and mediastinal contours given persistently reduced lung volumes. There is unchanged elevation of the right hemi now overall improved aeration of the lung bases with persistent right infrahilar opacities. No pleural effusion or pneumothorax. No evidence of edema.  No definite displaced left-sided rib fractures with special attention paid to the area demarcated by the radiopaque BB. Regional soft tissues appear normal. No radiopaque foreign body.  Post L2 vertebroplasty/kyphoplasty.  Known comminuted left-sided intertrochanteric femur fracture is suboptimally evaluated  IMPRESSION: 1. No definite acute cardiopulmonary disease on this degraded hypoventilated examination. Specifically, no definite displaced left-sided rib fractures with special attention paid to the radiopaque BB. 2. Chronic elevation of the right hemidiaphragm with associated right infrahilar  opacities, likely atelectasis. 3. Known comminuted left-sided intertrochanteric femur fracture, incompletely evaluated. Please refer to dedicated left femur and pelvis radiographs performed earlier same day.   Electronically Signed   By: Sandi Mariscal M.D.   On: 09/21/2014 10:15   Dg Pelvis 1-2 Views  09/21/2014   CLINICAL DATA:  Left hip pain.  Initial encounter.  EXAM: PELVIS - 1-2 VIEW  COMPARISON:  Left hip radiograph-earlier same day  FINDINGS: There is a comminuted and displaced intertrochanteric left-sided femur fracture with associated foreshortening and mild varus angulation. No definite intra-articular extension. No definite displaced pelvic fracture. A bone island is noted within the anterior aspect of the right ilium limited visualization the contralateral right hip is normal. Regional soft tissues are normal. No radiopaque foreign body.  IMPRESSION: 1. Comminuted, minimally displaced left-sided intertrochanteric femur fracture. Please refer to dedicated left hip radiographs performed earlier same day. 2. No definite displaced pelvic fracture.   Electronically Signed   By: Sandi Mariscal M.D.   On: 09/21/2014 10:10   Dg Hip Operative Left  09/21/2014   CLINICAL DATA:  Status post ORIF with placement of an intra trochanteric nail ; 46 seconds of fluoro time reported  EXAM: OPERATIVE LEFT HIP ; 4 fluoro spot views  COMPARISON:  Preoperative femur series of September 21, 2014  FINDINGS: The patient has undergone placement of a telescoping nail with intra medullary rod for fixation of intertrochanteric fracture. Radiographic positioning of the metallic components is good. Alignment of the fracture fragments is near anatomic.  IMPRESSION: There is no immediate postprocedure complication following ORIF of the left posttraumatic intertrochanteric fracture.   Electronically Signed   By: David  Martinique   On: 09/21/2014 16:31   Dg Femur Left  09/21/2014   CLINICAL DATA:  Left hip pain.  Initial encounter.  EXAM:  LEFT FEMUR - 2 VIEW  COMPARISON:  80 pelvic radiographs - earlier same day  FINDINGS: There is a comminuted and minimally displaced left-sided intertrochanteric femur fracture with associated foreshortening and varus angulation. No definite intra-articular extension. No dislocation. Minimal amount of expected subcutaneous emphysema. No radiopaque foreign body.  No additional fractures are identified. Limited visualization of the adjacent knee is normal given obliquity. Vascular calcifications are noted at the level of the mid thigh.  IMPRESSION: Comminuted and minimally displaced left-sided intertrochanteric femur fracture with foreshortening and accentuated varus angulation. No definite intra-articular extension.   Electronically Signed   By: Sandi Mariscal M.D.   On: 09/21/2014 10:12   Dg Chest Port 1 View  09/21/2014   CLINICAL DATA:  Shortness of breath.  Initial encounter.  EXAM: PORTABLE CHEST - 1 VIEW  COMPARISON:  09/21/2014; 08/02/2010; 08/01/2010  FINDINGS: The examination is markedly degraded due to patient body habitus and portable technique.  Grossly unchanged enlarged cardiac silhouette and mediastinal contours. There is persistent rightward deviation of the tracheal air column at the level of the aortic arch. There is persistent elevation of the right hemidiaphragm with associated bibasilar opacities. There is unchanged mild diffuse slightly nodular thickening of the pulmonary interstitium. No new focal airspace opacities. No pleural effusion pneumothorax. No evidence of edema. Unchanged bones.  IMPRESSION: 1. No definite acute cardiopulmonary disease on this hypoventilated AP portable examination. Further evaluation with a PA and lateral chest radiograph may be obtained as clinically indicated. 2. Similar findings of cardiomegaly and prior elevation of the right hemidiaphragm.   Electronically Signed   By: Sandi Mariscal M.D.   On: 09/21/2014 12:51   Left hip fracture, fall   Mariea Clonts,  MD 09/21/14 [phone]

## 2014-09-21 NOTE — Anesthesia Procedure Notes (Signed)
Procedure Name: Intubation Date/Time: 09/21/2014 2:33 PM Performed by: Maeola Harman Pre-anesthesia Checklist: Patient identified, Emergency Drugs available, Suction available and Patient being monitored Patient Re-evaluated:Patient Re-evaluated prior to inductionOxygen Delivery Method: Circle system utilized Preoxygenation: Pre-oxygenation with 100% oxygen Intubation Type: IV induction Ventilation: Mask ventilation without difficulty Laryngoscope Size: Mac and 3 Grade View: Grade I Tube type: Oral Tube size: 7.5 mm Number of attempts: 1 Airway Equipment and Method: Stylet Placement Confirmation: positive ETCO2 and breath sounds checked- equal and bilateral Secured at: 22 cm Tube secured with: Tape Dental Injury: Teeth and Oropharynx as per pre-operative assessment

## 2014-09-22 DIAGNOSIS — S72002A Fracture of unspecified part of neck of left femur, initial encounter for closed fracture: Secondary | ICD-10-CM

## 2014-09-22 DIAGNOSIS — I10 Essential (primary) hypertension: Secondary | ICD-10-CM

## 2014-09-22 DIAGNOSIS — E119 Type 2 diabetes mellitus without complications: Secondary | ICD-10-CM

## 2014-09-22 LAB — BASIC METABOLIC PANEL
Anion gap: 10 (ref 5–15)
BUN: 21 mg/dL (ref 6–23)
CHLORIDE: 95 meq/L — AB (ref 96–112)
CO2: 27 meq/L (ref 19–32)
CREATININE: 0.74 mg/dL (ref 0.50–1.35)
Calcium: 7.7 mg/dL — ABNORMAL LOW (ref 8.4–10.5)
GFR calc non Af Amer: >90 mL/min (ref >90)
Glucose, Bld: 229 mg/dL — ABNORMAL HIGH (ref 70–99)
POTASSIUM: 4.5 meq/L (ref 3.7–5.3)
SODIUM: 132 meq/L — AB (ref 137–147)

## 2014-09-22 LAB — CBC
HCT: 34.5 % — ABNORMAL LOW (ref 39.0–52.0)
Hemoglobin: 11.5 g/dL — ABNORMAL LOW (ref 13.0–17.0)
MCH: 28.2 pg (ref 26.0–34.0)
MCHC: 33.3 g/dL (ref 30.0–36.0)
MCV: 84.6 fL (ref 78.0–100.0)
PLATELETS: 147 10*3/uL — AB (ref 150–400)
RBC: 4.08 MIL/uL — AB (ref 4.22–5.81)
RDW: 13.7 % (ref 11.5–15.5)
WBC: 8.5 10*3/uL (ref 4.0–10.5)

## 2014-09-22 LAB — GLUCOSE, CAPILLARY
GLUCOSE-CAPILLARY: 217 mg/dL — AB (ref 70–99)
GLUCOSE-CAPILLARY: 233 mg/dL — AB (ref 70–99)
Glucose-Capillary: 198 mg/dL — ABNORMAL HIGH (ref 70–99)
Glucose-Capillary: 218 mg/dL — ABNORMAL HIGH (ref 70–99)
Glucose-Capillary: 155 mg/dL — ABNORMAL HIGH (ref 70–99)

## 2014-09-22 MED ORDER — PNEUMOCOCCAL VAC POLYVALENT 25 MCG/0.5ML IJ INJ
0.5000 mL | INJECTION | INTRAMUSCULAR | Status: DC
  Filled 2014-09-22: qty 0.5

## 2014-09-22 MED ORDER — INFLUENZA VAC SPLIT QUAD 0.5 ML IM SUSY
0.5000 mL | PREFILLED_SYRINGE | INTRAMUSCULAR | Status: AC
  Administered 2014-09-23: 0.5 mL via INTRAMUSCULAR
  Filled 2014-09-22: qty 0.5

## 2014-09-22 MED ORDER — INSULIN ASPART PROT & ASPART (70-30 MIX) 100 UNIT/ML ~~LOC~~ SUSP
40.0000 [IU] | Freq: Two times a day (BID) | SUBCUTANEOUS | Status: DC
  Administered 2014-09-22 – 2014-09-27 (×8): 40 [IU] via SUBCUTANEOUS
  Filled 2014-09-22 (×3): qty 10

## 2014-09-22 MED ORDER — ENOXAPARIN SODIUM 40 MG/0.4ML ~~LOC~~ SOLN
40.0000 mg | SUBCUTANEOUS | Status: DC
  Administered 2014-09-22 – 2014-09-26 (×5): 40 mg via SUBCUTANEOUS
  Filled 2014-09-22 (×6): qty 0.4

## 2014-09-22 NOTE — Progress Notes (Addendum)
Subjective: 1 Day Post-Op Procedure(s) (LRB): left hip intertroch fracture (Left) s/p IM nail Patient reports pain as mild.  Notes some mild incisional pain left lateral thigh. No other c/o this AM.   Objective: Vital signs in last 24 hours: Temp:  [97.8 F (36.6 C)-99.1 F (37.3 C)] 99.1 F (37.3 C) (10/04 0655) Pulse Rate:  [67-131] 108 (10/04 0655) Resp:  [13-25] 16 (10/04 0655) BP: (104-176)/(61-157) 104/75 mmHg (10/04 0655) SpO2:  [92 %-100 %] 93 % (10/04 0655) Weight:  [122.471 kg (270 lb)] 122.471 kg (270 lb) (10/03 0819)  Intake/Output from previous day: 10/03 0701 - 10/04 0700 In: 1000 [I.V.:1000] Out: 1925 [Urine:1725; Blood:200] Intake/Output this shift:     Recent Labs  09/21/14 0957 09/21/14 1128 09/22/14 0356  HGB 14.5 13.4 11.5*    Recent Labs  09/21/14 1128 09/22/14 0356  WBC 11.2* 8.5  RBC 4.81 4.08*  HCT 40.4 34.5*  PLT 133* 147*    Recent Labs  09/21/14 0957 09/21/14 1128 09/22/14 0356  NA 139  --  132*  K 4.0  --  4.5  CL 99  --  95*  CO2 27  --  27  BUN 17  --  21  CREATININE 0.63 0.62 0.74  GLUCOSE 157*  --  229*  CALCIUM 8.6  --  7.7*    Recent Labs  09/21/14 0957  INR 1.11    Neurologically intact ABD soft Neurovascular intact Sensation intact distally Intact pulses distally Dorsiflexion/Plantar flexion intact Incision: dressing C/D/I and no drainage No cellulitis present Compartment soft no calf pain or sign of DVT  Assessment/Plan: 1 Day Post-Op Procedure(s) (LRB): left hip intertroch fracture (Left) s/p IM nail Advance diet Up with therapy D/C IV fluids TTWB LLE Lovenox for DVT ppx CSW consult for placement  BISSELL, JACLYN M. 09/22/2014, 8:04 AM

## 2014-09-22 NOTE — Progress Notes (Signed)
Foley pulled 0700, patient still unable to void. Bladder scans done at 1500 and 1800 showing no more than 75 cc's in bladder. MD made aware- wishes to continue to monitor patients output and wait to perform in and out cath if bladder scan greater than 300 cc's.

## 2014-09-22 NOTE — Discharge Instructions (Signed)
Hip Fracture A hip fracture is a fracture of the upper part of your thigh bone (femur).  CAUSES A hip fracture is caused by a direct blow to the side of your hip. This is usually the result of a fall but can occur in other circumstances, such as an automobile accident. RISK FACTORS There is an increased risk of hip fractures in people with:  An unsteady walking pattern (gait) and those with conditions that contribute to poor balance, such as Parkinson's disease or dementia.  Osteopenia and osteoporosis.  Cancer that spreads to the leg bones.  Certain metabolic diseases. SYMPTOMS  Symptoms of hip fracture include:  Pain over the injured hip.  Inability to put weight on the leg in which the fracture occurred (although, some patients are able to walk after a hip fracture).  Toes and foot of the affected leg point outward when you lie down. DIAGNOSIS A physical exam can determine if a hip fracture is likely to have occurred. X-ray exams are needed to confirm the fracture and to look for other injuries. The X-ray exam can help to determine the type of hip fracture. Rarely, the fracture is not visible on an X-ray image and a CT scan or MRI will have to be done. TREATMENT  The treatment for a fracture is usually surgery. This means using a screw, nail, or rod to hold the bones in place.  HOME CARE INSTRUCTIONS Take all medicines as directed by your health care provider. SEEK MEDICAL CARE IF: Pain continues, even after taking pain medicine. MAKE SURE YOU:  Understand these instructions.   Will watch your condition.  Will get help right away if you are not doing well or get worse. Document Released: 12/06/2005 Document Revised: 12/11/2013 Document Reviewed: 07/18/2013 [facility] Patient Information 2015 [place], [place]. This information is not intended to replace advice given to you by your health care provider. Make sure you discuss any questions you have with your health care  provider.  You may remove the dressings and shower 3 days after surgery.  Then cover the incisions with dry gauze and paper tape.

## 2014-09-22 NOTE — Progress Notes (Signed)
[facility] PROGRESS NOTE  Jeffery Nelson OYD:[id number] DOB: [birth year] DOA: 09/21/2014 PCP: Stephens Shire, MD  Assessment/Plan: 68 y/o male with PMH of IDDM, HTN, HPL, lymphedema is admitted with mechanical fall and L hip fracture   1. L hi[p fracture  -10/3 s/p Left hip intertrochanteric fracture; per ortho   2. IDDM; Pt reports taking 60 Units insulin (70/30) TID but decreased due to hypoglycemia  -HA1c-7.0; cont mealtime insulin decreased to 40 to BID,+ISS; monitor  3. HTN, cont home regimen  4. Lymphedema; cont lasix   Code Status: full Family Communication:  D/w patient (indicate person spoken with, relationship, and if by phone, the number) Disposition Plan: pend PT   Consultants:  Ortho   Procedures:  L hip  Antibiotics:   none (indicate start date, and stop date if known)  HPI/Subjective: alert  Objective: Filed Vitals:   09/22/14 0655  BP: 104/75  Pulse: 108  Temp: 99.1 F (37.3 C)  Resp: 16    Intake/Output Summary (Last 24 hours) at 09/22/14 1159 Last data filed at 09/22/14 0656  Gross per 24 hour  Intake   1000 ml  Output   1925 ml  Net   -925 ml   Filed Weights   09/21/14 0819  Weight: 122.471 kg (270 lb)    Exam:   General:  Alert   Cardiovascular: s1,s2 rrr  Respiratory: CTA BL  Abdomen: soft, nt,nd   Musculoskeletal: mild edema   Data Reviewed: Basic Metabolic Panel:  Recent Labs Lab 09/21/14 0957 09/21/14 1128 09/22/14 0356  NA 139  --  132*  K 4.0  --  4.5  CL 99  --  95*  CO2 27  --  27  GLUCOSE 157*  --  229*  BUN 17  --  21  CREATININE 0.63 0.62 0.74  CALCIUM 8.6  --  7.7*   Liver Function Tests:  Recent Labs Lab 09/21/14 1128  AST 21  ALT 18  ALKPHOS 107  BILITOT 0.8  PROT 6.7  ALBUMIN 3.8   No results found for this basename: LIPASE, AMYLASE,  in the last 168 hours No results found for this basename: AMMONIA,  in the last 168 hours CBC:  Recent Labs Lab 09/21/14 0957  09/21/14 1128 09/22/14 0356  WBC 13.1* 11.2* 8.5  NEUTROABS 11.3*  --   --   HGB 14.5 13.4 11.5*  HCT 42.3 40.4 34.5*  MCV 84.3 84.0 84.6  PLT 140* 133* 147*   Cardiac Enzymes:  Recent Labs Lab 09/21/14 1128  TROPONINI <0.30   BNP (last 3 results)  Recent Labs  09/21/14 1132  PROBNP 382.8*   CBG:  Recent Labs Lab 09/21/14 1241 09/21/14 1643 09/21/14 2114 09/22/14 0658  GLUCAP 133* 154* 244* 198*    No results found for this or any previous visit (from the past 240 hour(s)).   Studies: Dg Ribs Unilateral W/chest Left  09/21/2014   CLINICAL DATA:  Post fall, now with left lower anterior rib pain. History diabetes. Initial encounter.  EXAM: LEFT RIBS AND CHEST - 3+ VIEW  COMPARISON:  Chest radiograph- 08/04/2010; 08/02/2010  FINDINGS: Examination is degraded due to patient body habitus.  Grossly unchanged enlarged cardiac silhouette and mediastinal contours given persistently reduced lung volumes. There is unchanged elevation of the right hemi now overall improved aeration of the lung bases with persistent right infrahilar opacities. No pleural effusion or pneumothorax. No evidence of edema.  No definite displaced left-sided rib fractures with special attention paid to the  area demarcated by the radiopaque BB. Regional soft tissues appear normal. No radiopaque foreign body.  Post L2 vertebroplasty/kyphoplasty.  Known comminuted left-sided intertrochanteric femur fracture is suboptimally evaluated  IMPRESSION: 1. No definite acute cardiopulmonary disease on this degraded hypoventilated examination. Specifically, no definite displaced left-sided rib fractures with special attention paid to the radiopaque BB. 2. Chronic elevation of the right hemidiaphragm with associated right infrahilar opacities, likely atelectasis. 3. Known comminuted left-sided intertrochanteric femur fracture, incompletely evaluated. Please refer to dedicated left femur and pelvis radiographs performed earlier  same day.   Electronically Signed   By: Sandi Mariscal M.D.   On: 09/21/2014 10:15   Dg Pelvis 1-2 Views  09/21/2014   CLINICAL DATA:  Left hip pain.  Initial encounter.  EXAM: PELVIS - 1-2 VIEW  COMPARISON:  Left hip radiograph-earlier same day  FINDINGS: There is a comminuted and displaced intertrochanteric left-sided femur fracture with associated foreshortening and mild varus angulation. No definite intra-articular extension. No definite displaced pelvic fracture. A bone island is noted within the anterior aspect of the right ilium limited visualization the contralateral right hip is normal. Regional soft tissues are normal. No radiopaque foreign body.  IMPRESSION: 1. Comminuted, minimally displaced left-sided intertrochanteric femur fracture. Please refer to dedicated left hip radiographs performed earlier same day. 2. No definite displaced pelvic fracture.   Electronically Signed   By: Sandi Mariscal M.D.   On: 09/21/2014 10:10   Dg Hip Operative Left  09/21/2014   CLINICAL DATA:  Status post ORIF with placement of an intra trochanteric nail ; 46 seconds of fluoro time reported  EXAM: OPERATIVE LEFT HIP ; 4 fluoro spot views  COMPARISON:  Preoperative femur series of September 21, 2014  FINDINGS: The patient has undergone placement of a telescoping nail with intra medullary rod for fixation of intertrochanteric fracture. Radiographic positioning of the metallic components is good. Alignment of the fracture fragments is near anatomic.  IMPRESSION: There is no immediate postprocedure complication following ORIF of the left posttraumatic intertrochanteric fracture.   Electronically Signed   By: David  Martinique   On: 09/21/2014 16:31   Dg Femur Left  09/21/2014   CLINICAL DATA:  Left hip pain.  Initial encounter.  EXAM: LEFT FEMUR - 2 VIEW  COMPARISON:  80 pelvic radiographs - earlier same day  FINDINGS: There is a comminuted and minimally displaced left-sided intertrochanteric femur fracture with associated  foreshortening and varus angulation. No definite intra-articular extension. No dislocation. Minimal amount of expected subcutaneous emphysema. No radiopaque foreign body.  No additional fractures are identified. Limited visualization of the adjacent knee is normal given obliquity. Vascular calcifications are noted at the level of the mid thigh.  IMPRESSION: Comminuted and minimally displaced left-sided intertrochanteric femur fracture with foreshortening and accentuated varus angulation. No definite intra-articular extension.   Electronically Signed   By: Sandi Mariscal M.D.   On: 09/21/2014 10:12   Dg Chest Port 1 View  09/21/2014   CLINICAL DATA:  Shortness of breath.  Initial encounter.  EXAM: PORTABLE CHEST - 1 VIEW  COMPARISON:  09/21/2014; 08/02/2010; 08/01/2010  FINDINGS: The examination is markedly degraded due to patient body habitus and portable technique.  Grossly unchanged enlarged cardiac silhouette and mediastinal contours. There is persistent rightward deviation of the tracheal air column at the level of the aortic arch. There is persistent elevation of the right hemidiaphragm with associated bibasilar opacities. There is unchanged mild diffuse slightly nodular thickening of the pulmonary interstitium. No new focal airspace opacities. No pleural  effusion pneumothorax. No evidence of edema. Unchanged bones.  IMPRESSION: 1. No definite acute cardiopulmonary disease on this hypoventilated AP portable examination. Further evaluation with a PA and lateral chest radiograph may be obtained as clinically indicated. 2. Similar findings of cardiomegaly and prior elevation of the right hemidiaphragm.   Electronically Signed   By: Sandi Mariscal M.D.   On: 09/21/2014 12:51    Scheduled Meds: . docusate sodium  100 mg Oral BID  . enoxaparin (LOVENOX) injection  40 mg Subcutaneous Q24H  . FLUoxetine  20 mg Oral Daily  . furosemide  20 mg Oral Daily  . insulin aspart  0-5 Units Subcutaneous QHS  . insulin  aspart  0-9 Units Subcutaneous TID WC  . insulin aspart protamine- aspart  75 Units Subcutaneous BID WC  . quinapril  20 mg Oral Daily  . rOPINIRole  4 mg Oral Daily  . rosuvastatin  20 mg Oral q1800   Continuous Infusions: . sodium chloride      Active Problems:   Hip fracture    Time spent: >35 minutes     Kinnie Feil  [facility] Pager [phone]. If 7PM-7AM, please contact night-coverage at [email or web address], password [facility] 09/22/2014, 11:59 AM  LOS: 1 day

## 2014-09-22 NOTE — Evaluation (Signed)
Physical Therapy Evaluation Patient Details Name: Jeffery Nelson MRN: [id number] DOB: [birth year] Today's Date: 09/22/2014   History of Present Illness  68 y/o male with PMH of IDDM, HTN, HPL, lymphedema is admitted with mechanical fall and L hip fracture. s/p left hip intertrochanteric fracture  Clinical Impression  Patient is seen following the above procedure and presents with functional limitations due to the deficits listed below (see PT Problem List). Min assist for all mobility tasks assessed today (bed, transfer, & ambulation) however, only able to ambulate very short distance due to rapid fatigue. He was independent POA and lives alone, however states he will probably have a sister available to stay with him as needed. Patient will need physical rehabilitation to increase his independence and safety with mobility prior to returning home.    Follow Up Recommendations CIR    Equipment Recommendations  3in1 (PT)    Recommendations for Other Services Rehab consult;OT consult     Precautions / Restrictions Precautions Precautions: Fall Restrictions Weight Bearing Restrictions: Yes LLE Weight Bearing: Touchdown weight bearing      Mobility  Bed Mobility Overal bed mobility: Needs Assistance Bed Mobility: Supine to Sit     Supine to sit: Min assist;HOB elevated     General bed mobility comments: Min assist for LLE support out of bed. Heavy use of rail. VC for technique. Requires extra tim  Transfers Overall transfer level: Needs assistance Equipment used: Rolling walker (2 wheeled) Transfers: Sit to/from [facility] Sit to Stand: Min assist;From elevated surface Stand pivot transfers: Min assist       General transfer comment: Min assist for boost from elevated bed surface. VC for hand placement and facilitation for forward weight shift to provide mechanical advantage to stand.  Min assist for walker placement with pivot transfer. Able to maintain  TDWB through LLE during pivot.  Ambulation/Gait Ambulation/Gait assistance: Min assist Ambulation Distance (Feet): 2 Feet Assistive device: Rolling walker (2 wheeled) Gait Pattern/deviations: Step-to pattern;Decreased step length - right;Decreased step length - left;Decreased stance time - left;Decreased stride length;Antalgic;Trunk flexed;Shuffle   Gait velocity interpretation: Below normal speed for age/gender General Gait Details: Educated on safe DME use with rolling walker. Pt only able to take several small shuffle steps before fatiguing and required to sit.  Stairs            Wheelchair Mobility    Modified Rankin (Stroke Patients Only)       Balance Overall balance assessment: Needs assistance Sitting-balance support: No upper extremity supported;Feet supported Sitting balance-Leahy Scale: Fair     Standing balance support: Bilateral upper extremity supported Standing balance-Leahy Scale: Poor                               Pertinent Vitals/Pain Pain Assessment: 0-10 Pain Score: 6  Pain Location: left hip Pain Intervention(s): Monitored during session;Premedicated before session;Repositioned    Home Living Family/patient expects to be discharged to:: Private residence Living Arrangements: Other relatives (sister) Available Help at Discharge: Friend(s);Available 24 hours/day ("I think she can stay with me" sister) Type of Home: Mobile home Home Access: Stairs to enter Entrance Stairs-Rails: Right;Left Entrance Stairs-Number of Steps: 3 Home Layout: One level Home Equipment: Walker - standard;Shower seat      Prior Function Level of Independence: Independent               Hand Dominance   Dominant Hand: Right    Extremity/Trunk Assessment  Upper Extremity Assessment: Defer to OT evaluation           Lower Extremity Assessment: LLE deficits/detail   LLE Deficits / Details: Decreased strength and ROM as expected post op      Communication   Communication: No difficulties  Cognition Arousal/Alertness: Suspect due to medications;Lethargic Behavior During Therapy: WFL for tasks assessed/performed Overall Cognitive Status: Within Functional Limits for tasks assessed                      General Comments General comments (skin integrity, edema, etc.): Pt complains of Left calf pain which is very tender to palpation, (RN notified) very mild discomfort with passive dorsiflexion. Bil LEs with calor and erythema. Discussed d/c planning and importance of being OOB and compliance with exercises.    Exercises General Exercises - Lower Extremity Ankle Circles/Pumps: AROM;Both;10 reps;Seated Quad Sets: Strengthening;Both;10 reps;Seated Gluteal Sets: Strengthening;Both;10 reps;Seated Long Arc Quad: Strengthening;Left;10 reps;Seated      Assessment/Plan    PT Assessment Patient needs continued PT services  PT Diagnosis Difficulty walking;Abnormality of gait;Acute pain   PT Problem List Decreased strength;Decreased range of motion;Decreased activity tolerance;Decreased balance;Decreased mobility;Decreased knowledge of use of DME;Decreased knowledge of precautions;Pain;Obesity  PT Treatment Interventions DME instruction;Gait training;Functional mobility training;Therapeutic activities;Therapeutic exercise;Balance training;Neuromuscular re-education;Patient/family education;Modalities   PT Goals (Current goals can be found in the Care Plan section) Acute Rehab PT Goals Patient Stated Goal: "go where I need to, so I can get better" PT Goal Formulation: With patient Time For Goal Achievement: 09/29/14 Potential to Achieve Goals: Good    Frequency Min 5X/week   Barriers to discharge Decreased caregiver support Pt lives alone    Co-evaluation               End of Session Equipment Utilized During Treatment: Gait belt Activity Tolerance: Patient limited by fatigue Patient left: in chair;with call  bell/phone within reach Nurse Communication: Mobility status         Time: 1226-1256 PT Time Calculation (min): 30 min   Charges:   PT Evaluation $Initial PT Evaluation Tier I: 1 Procedure PT Treatments $Therapeutic Activity: 8-22 mins   PT G Codes:        Elayne Snare, [place]   Ellouise Newer 09/22/2014, 1:44 PM

## 2014-09-23 ENCOUNTER — Encounter (HOSPITAL_COMMUNITY): Payer: Self-pay | Admitting: Orthopedic Surgery

## 2014-09-23 DIAGNOSIS — S72142A Displaced intertrochanteric fracture of left femur, initial encounter for closed fracture: Secondary | ICD-10-CM | POA: Diagnosis not present

## 2014-09-23 LAB — BASIC METABOLIC PANEL
Anion gap: 12 (ref 5–15)
BUN: 17 mg/dL (ref 6–23)
CO2: 26 meq/L (ref 19–32)
Calcium: 8.1 mg/dL — ABNORMAL LOW (ref 8.4–10.5)
Chloride: 97 mEq/L (ref 96–112)
Creatinine, Ser: 0.61 mg/dL (ref 0.50–1.35)
GFR calc Af Amer: >90 mL/min (ref >90)
GFR calc non Af Amer: >90 mL/min (ref >90)
GLUCOSE: 190 mg/dL — AB (ref 70–99)
POTASSIUM: 3.8 meq/L (ref 3.7–5.3)
Sodium: 135 mEq/L — ABNORMAL LOW (ref 137–147)

## 2014-09-23 LAB — CBC
HCT: 33.3 % — ABNORMAL LOW (ref 39.0–52.0)
HEMOGLOBIN: 11.4 g/dL — AB (ref 13.0–17.0)
MCH: 29.2 pg (ref 26.0–34.0)
MCHC: 34.2 g/dL (ref 30.0–36.0)
MCV: 85.2 fL (ref 78.0–100.0)
Platelets: 151 10*3/uL (ref 150–400)
RBC: 3.91 MIL/uL — AB (ref 4.22–5.81)
RDW: 13.7 % (ref 11.5–15.5)
WBC: 8.8 10*3/uL (ref 4.0–10.5)

## 2014-09-23 LAB — GLUCOSE, CAPILLARY
GLUCOSE-CAPILLARY: 180 mg/dL — AB (ref 70–99)
GLUCOSE-CAPILLARY: 154 mg/dL — AB (ref 70–99)
Glucose-Capillary: 155 mg/dL — ABNORMAL HIGH (ref 70–99)
Glucose-Capillary: 133 mg/dL — ABNORMAL HIGH (ref 70–99)

## 2014-09-23 MED ORDER — CALCIUM CARBONATE ANTACID 500 MG PO CHEW
1.0000 | CHEWABLE_TABLET | ORAL | Status: DC | PRN
  Administered 2014-09-23 – 2014-09-25 (×2): 200 mg via ORAL
  Filled 2014-09-23 (×3): qty 1

## 2014-09-23 MED ORDER — OXYCODONE HCL 5 MG PO TABS: 5.0000 mg | ORAL_TABLET | ORAL | Status: DC | PRN

## 2014-09-23 MED ORDER — ENOXAPARIN SODIUM 40 MG/0.4ML ~~LOC~~ SOLN: 40.0000 mg | SUBCUTANEOUS | Status: DC

## 2014-09-23 NOTE — Evaluation (Signed)
Occupational Therapy Evaluation Patient Details Name: Jeffery Nelson MRN: [id number] DOB: 1946-07-18 Today's Date: 09/23/2014    History of Present Illness 68 y/o male with PMH of IDDM, HTN, HPL, lymphedema is admitted with mechanical fall and L hip fracture. s/p IM nail   Clinical Impression   This 68 yo male admitted and underwent above presents to acute OT with increased pain, decreased mobility, obesity, TDWB on LLE all affecting his ability to take care of himself at an Independent level as he did pta. He will benefit from continued OT on CIR to get to a Mod I level to go home with intermittent S.    Follow Up Recommendations  CIR    Equipment Recommendations  3 in 1 bedside comode       Precautions / Restrictions Precautions Precautions: Fall Restrictions Weight Bearing Restrictions: Yes LLE Weight Bearing: Touchdown weight bearing      Mobility Bed Mobility Overal bed mobility: Pt up in recliner upon my arrival           ADL Overall ADL's : Needs assistance/impaired Eating/Feeding: Independent;Sitting   Grooming: Set up;Sitting   Upper Body Bathing: Set up;Sitting   Lower Body Bathing: Minimal assistance;With adaptive equipment   Upper Body Dressing : Set up;Sitting   Lower Body Dressing: Moderate assistance;With adaptive equipment                                 Pertinent Vitals/Pain Pain Assessment: 0-10 Pain Score: 5  Pain Location: left hip Pain Descriptors / Indicators: Aching Pain Intervention(s): Monitored during session;Repositioned     Hand Dominance Right   Extremity/Trunk Assessment Upper Extremity Assessment Upper Extremity Assessment: Overall WFL for tasks assessed           Communication Communication Communication: No difficulties   Cognition Arousal/Alertness: Awake/alert Behavior During Therapy: WFL for tasks assessed/performed Overall Cognitive Status: Within Functional Limits for tasks assessed                                 Home Living Family/patient expects to be discharged to:: Private residence Living Arrangements:  (hopes to go home with sister or sister to stay with him) Available Help at Discharge: Family;Available 24 hours/day Type of Home: Mobile home Home Access: Stairs to enter Entrance Stairs-Number of Steps: 3 Entrance Stairs-Rails: Left;Right Home Layout: One level     Bathroom Shower/Tub: Tub/shower unit;Curtain (reports that he normally takes sponge baths due to his size) Shower/tub characteristics: Architectural technologist: Standard     Home Equipment: Walker - standard;Shower seat          Prior Functioning/Environment Level of Independence: Independent             OT Diagnosis: Generalized weakness;Acute pain   OT Problem List: Decreased strength;Decreased range of motion;Impaired balance (sitting and/or standing);Pain;Obesity;Decreased knowledge of precautions;Decreased knowledge of use of DME or AE   OT Treatment/Interventions: Self-care/ADL training;Balance training;DME and/or AE instruction;Patient/family education    OT Goals(Current goals can be found in the care plan section) Acute Rehab OT Goals Patient Stated Goal: to rehab then home with my sister or my sister coming to stay with me OT Goal Formulation: With patient Time For Goal Achievement: 09/30/14 Potential to Achieve Goals: Good  OT Frequency: Min 2X/week              End of Session Equipment  Utilized During Treatment:  (reacher and sock aid)  Activity Tolerance: Patient tolerated treatment well Patient left: in chair;with call bell/phone within reach   Time: [phone] OT Time Calculation (min): 22 min Charges:  OT General Charges $OT Visit: 1 Procedure OT Evaluation $Initial OT Evaluation Tier I: 1 Procedure OT Treatments $Self Care/Home Management : 8-22 mins  Almon Register [phone] 09/23/2014, 10:33 AM

## 2014-09-23 NOTE — Progress Notes (Signed)
Subjective: 2 Days Post-Op Procedure(s) (LRB): left hip intertroch fracture (Left)  Patient reports pain as mild.  Jeffery Nelson reports that he has minimal complaints at this time and feels overall better today than yesterday. He has been up with PT with minimal difficulty. He only notes pain to his incision sites when he rolls over onto his left side, otherwise he is pain-free. His foley was d/c yesterday and he has normal urinary function. He has a normal appetite and +flatus and BMs. He denies any new Ha, CP, SOB, N, V, fever, chills, leg swelling or pain.   Objective:   VITALS:  Temp:  [97.9 F (36.6 C)-98.7 F (37.1 C)] 98.6 F (37 C) (10/05 0545) Pulse Rate:  [90-98] 98 (10/05 0545) Resp:  [14-16] 16 (10/05 0545) BP: (115-131)/(58-73) 131/73 mmHg (10/05 0545) SpO2:  [93 %-99 %] 93 % (10/05 0545)  WD/WN male in nad. A and O x4. Mood and affect appropriate. EOMI. Respirations normal and unlabored. Abdomen soft and non-tender. Dressings to left lateral thigh C/D/I. NV intact with brisk capillary refill and 5/5 strength of toe and ankle extensors and flexors bilaterally. Distal sensation intact bilaterally. No calf swelling or palpable cords. No lymphadenopathy.    LABS  Recent Labs  09/21/14 0957 09/21/14 1128 09/22/14 0356 09/23/14 0540  HGB 14.5 13.4 11.5* 11.4*  WBC 13.1* 11.2* 8.5 8.8  PLT 140* 133* 147* 151    Recent Labs  09/22/14 0356 09/23/14 0540  NA 132* 135*  K 4.5 3.8  CL 95* 97  CO2 27 26  BUN 21 17  CREATININE 0.74 0.61  GLUCOSE 229* 190*    Recent Labs  09/21/14 0957  INR 1.11     Assessment/Plan: 2 Days Post-Op Procedure(s) (LRB): left hip intertroch fracture (Left)  -Up with PT; TTWB LLE -Lovenox for DVT prophylaxis; Rx in chart for D/C -PO pain medications for pain control; Rx in chart for D/C -Continue hip precautions -CSW consult for D/C plans  -Continue with IM recs for other medical problems   Jeffery Nelson 09/23/2014,  7:37 AM [phone]

## 2014-09-23 NOTE — Progress Notes (Signed)
[facility] PROGRESS NOTE  KENT RIENDEAU UXL:[id number] DOB: 12-08-1946 DOA: 09/21/2014 PCP: Stephens Shire, MD  Assessment/Plan: 68 y/o male with PMH of IDDM, HTN, HPL, lymphedema is admitted with mechanical fall and L hip fracture   1. L hi[p fracture  -10/3 s/p Left hip intertrochanteric fracture; per ortho  -CIR rehab consult pend   2. IDDM; Pt reports taking 60 Units insulin (70/30) TID but decreased due to hypoglycemia  -HA1c-7.0; cont mealtime insulin decreased to 40 to BID,+ISS; monitor  3. HTN, cont home regimen  4. Lymphedema; cont lasix   Code Status: full Family Communication:  D/w patient (indicate person spoken with, relationship, and if by phone, the number) Disposition Plan: pend  CIR   Consultants:  Ortho   Procedures:  L hip  Antibiotics:   none (indicate start date, and stop date if known)  HPI/Subjective: alert  Objective: Filed Vitals:   09/23/14 0545  BP: 131/73  Pulse: 98  Temp: 98.6 F (37 C)  Resp: 16    Intake/Output Summary (Last 24 hours) at 09/23/14 1048 Last data filed at 09/23/14 0546  Gross per 24 hour  Intake    240 ml  Output    200 ml  Net     40 ml   Filed Weights   09/21/14 0819  Weight: 122.471 kg (270 lb)    Exam:   General:  Alert   Cardiovascular: s1,s2 rrr  Respiratory: CTA BL  Abdomen: soft, nt,nd   Musculoskeletal: mild edema   Data Reviewed: Basic Metabolic Panel:  Recent Labs Lab 09/21/14 0957 09/21/14 1128 09/22/14 0356 09/23/14 0540  NA 139  --  132* 135*  K 4.0  --  4.5 3.8  CL 99  --  95* 97  CO2 27  --  27 26  GLUCOSE 157*  --  229* 190*  BUN 17  --  21 17  CREATININE 0.63 0.62 0.74 0.61  CALCIUM 8.6  --  7.7* 8.1*   Liver Function Tests:  Recent Labs Lab 09/21/14 1128  AST 21  ALT 18  ALKPHOS 107  BILITOT 0.8  PROT 6.7  ALBUMIN 3.8   No results found for this basename: LIPASE, AMYLASE,  in the last 168 hours No results found for this basename: AMMONIA,   in the last 168 hours CBC:  Recent Labs Lab 09/21/14 0957 09/21/14 1128 09/22/14 0356 09/23/14 0540  WBC 13.1* 11.2* 8.5 8.8  NEUTROABS 11.3*  --   --   --   HGB 14.5 13.4 11.5* 11.4*  HCT 42.3 40.4 34.5* 33.3*  MCV 84.3 84.0 84.6 85.2  PLT 140* 133* 147* 151   Cardiac Enzymes:  Recent Labs Lab 09/21/14 1128  TROPONINI <0.30   BNP (last 3 results)  Recent Labs  09/21/14 1132  PROBNP 382.8*   CBG:  Recent Labs Lab 09/22/14 1227 09/22/14 1702 09/22/14 1759 09/22/14 2207 09/23/14 0647  GLUCAP 217* 233* 218* 155* 180*    No results found for this or any previous visit (from the past 240 hour(s)).   Studies: Dg Hip Operative Left  09/21/2014   CLINICAL DATA:  Status post ORIF with placement of an intra trochanteric nail ; 46 seconds of fluoro time reported  EXAM: OPERATIVE LEFT HIP ; 4 fluoro spot views  COMPARISON:  Preoperative femur series of September 21, 2014  FINDINGS: The patient has undergone placement of a telescoping nail with intra medullary rod for fixation of intertrochanteric fracture. Radiographic positioning of the metallic components is  good. Alignment of the fracture fragments is near anatomic.  IMPRESSION: There is no immediate postprocedure complication following ORIF of the left posttraumatic intertrochanteric fracture.   Electronically Signed   By: David  Martinique   On: 09/21/2014 16:31   Dg Chest Port 1 View  09/21/2014   CLINICAL DATA:  Shortness of breath.  Initial encounter.  EXAM: PORTABLE CHEST - 1 VIEW  COMPARISON:  09/21/2014; 08/02/2010; 08/01/2010  FINDINGS: The examination is markedly degraded due to patient body habitus and portable technique.  Grossly unchanged enlarged cardiac silhouette and mediastinal contours. There is persistent rightward deviation of the tracheal air column at the level of the aortic arch. There is persistent elevation of the right hemidiaphragm with associated bibasilar opacities. There is unchanged mild diffuse  slightly nodular thickening of the pulmonary interstitium. No new focal airspace opacities. No pleural effusion pneumothorax. No evidence of edema. Unchanged bones.  IMPRESSION: 1. No definite acute cardiopulmonary disease on this hypoventilated AP portable examination. Further evaluation with a PA and lateral chest radiograph may be obtained as clinically indicated. 2. Similar findings of cardiomegaly and prior elevation of the right hemidiaphragm.   Electronically Signed   By: Sandi Mariscal M.D.   On: 09/21/2014 12:51    Scheduled Meds: . docusate sodium  100 mg Oral BID  . enoxaparin (LOVENOX) injection  40 mg Subcutaneous Q24H  . FLUoxetine  20 mg Oral Daily  . furosemide  20 mg Oral Daily  . insulin aspart  0-5 Units Subcutaneous QHS  . insulin aspart  0-9 Units Subcutaneous TID WC  . insulin aspart protamine- aspart  40 Units Subcutaneous BID WC  . pneumococcal 23 valent vaccine  0.5 mL Intramuscular Tomorrow-1000  . quinapril  20 mg Oral Daily  . rOPINIRole  4 mg Oral Daily  . rosuvastatin  20 mg Oral q1800   Continuous Infusions:    Active Problems:   Hip fracture    Time spent: >35 minutes     Kinnie Feil  [facility] Pager [phone]. If 7PM-7AM, please contact night-coverage at [email or web address], password [place] 09/23/2014, 10:48 AM  LOS: 2 days

## 2014-09-23 NOTE — Progress Notes (Signed)
RN completed bladder scan. Scan read 19 cc's or less. Patient has had 3 diet Cokes since 1900. RN encouraged patient to drink lots of water. RN will re-scan bladder at 0600. Will continue to monitor.

## 2014-09-23 NOTE — Progress Notes (Signed)
NT re-scanned patients bladder. 200 cc's currently. Will continue to monitor.

## 2014-09-23 NOTE — Consult Note (Signed)
Physical Medicine and Rehabilitation Consult Reason for Consult: Left hip fracture Referring Physician: Dr. Doran Durand.    HPI: Jeffery Nelson is a 68 y.o. male who tripped and fell on 09/21/14 while putting sheets in the washer, landed onto his left hip and had difficulty weight bearing on LLE. He was evaluated in ED and found to have displaced left IT hip fracture. He was evaluated by Dr. Doran Durand and underwent ORIF with IM nail of left femur on the same day. Post op TDWB and on Lovenox for DVT prophylaxis. Post op with ABLA as well as hyponatremia. Therapy initiated and patient limited by weight bearing restrictions as well as body habitus.    Review of Systems  HENT: Negative for hearing loss.   Eyes: Negative for blurred vision and double vision.  Respiratory: Negative for cough and shortness of breath.   Cardiovascular: Negative for chest pain and palpitations.  Gastrointestinal: Negative for heartburn and nausea.  Genitourinary: Negative for dysuria and urgency.  Musculoskeletal: Positive for joint pain and myalgias.  Neurological: Positive for weakness. Negative for dizziness, tingling and headaches.  Psychiatric/Behavioral: The patient is not nervous/anxious and does not have insomnia.      Past Medical History  Diagnosis Date  . Shortness of breath   . Dyspnea on exertion   . Hypertension   . Lower extremity edema   . Type II or unspecified type diabetes mellitus with unspecified complication, not stated as uncontrolled   . Benign neoplasm of colon   . Chest pain     From rib fractures  . Polyneuropathy in diabetes(357.2)   . Pure hypercholesterolemia   . Psychosexual dysfunction with inhibited sexual excitement   . Restless legs syndrome (RLS)   . Sleep arousal disorder   . Unspecified venous (peripheral) insufficiency     Past Surgical History  Procedure Laterality Date  . Lower extremity venous doppler  09/04/2012    Mild valvular insufficiency in the R  Common Femoral vein w/ 1.6 sec of duration of refliux  . Transthoracic echocardiogram  09/04/2012    EF >55%, normal    Family History  Problem Relation Age of Onset  . Heart disease Mother   . Heart disease Father   . Diabetes Brother   . Heart disease Maternal Grandmother   . Heart disease Maternal Grandfather   . Stroke Paternal Grandmother   . Cancer Paternal Grandfather     Skin cancer    Social History:  Lives alone. Reports sister can assist past discharge.  Independent PTA. Retired Optometrist. He reports that he has never smoked. He has never used smokeless tobacco. He reports that he does not drink alcohol or use illicit drugs.   Allergies  Allergen Reactions  . Lipitor [Atorvastatin]   . Lyrica [Pregabalin]    Medications Prior to Admission  Medication Sig Dispense Refill  . colchicine 0.6 MG tablet Take 0.6 mg by mouth as needed (for gout).       . fluocinonide cream (LIDEX) 2.50 % Apply 1 application topically as needed (rash).      [name] FLUoxetine (PROZAC) 20 MG tablet Take 20 mg by mouth daily.      . furosemide (LASIX) 20 MG tablet Take 20 mg by mouth daily.      . insulin lispro protamine-lispro (HUMALOG 75/25) (75-25) 100 UNIT/ML SUSP injection Inject 75 Units into the skin 3 (three) times daily.      . Multiple Vitamin (MULTIVITAMIN WITH MINERALS) TABS tablet Take  1 tablet by mouth daily.      [name] OVER THE COUNTER MEDICATION Take 1 tablet by mouth daily. Calcium 333 mg with Magnesium 133 mg and Zinc 5 mg tablets      . potassium gluconate 595 MG TABS tablet Take 595 mg by mouth daily.      . quinapril (ACCUPRIL) 20 MG tablet Take 20 mg by mouth daily.      [name] rOPINIRole (REQUIP) 4 MG tablet Take 4 mg by mouth daily.       . rosuvastatin (CRESTOR) 20 MG tablet Take 20 mg by mouth daily.        Home: Home Living Family/patient expects to be discharged to:: Private residence Living Arrangements:  (hopes to go home with sister or sister to stay with him) Available  Help at Discharge: Family;Available 24 hours/day Type of Home: Mobile home Home Access: Stairs to enter Entrance Stairs-Number of Steps: 3 Entrance Stairs-Rails: Left;Right Home Layout: One level Home Equipment: Walker - standard;Shower seat  Functional History: Prior Function Level of Independence: Independent Functional Status:  Mobility: Bed Mobility Overal bed mobility: Needs Assistance Bed Mobility: Supine to Sit Supine to sit: Min assist;HOB elevated General bed mobility comments: Min assist for LLE support out of bed. Heavy use of rail. VC for technique. Able to scoot self forward to edge of bed but with great effort Transfers Overall transfer level: Needs assistance Equipment used: Rolling walker (2 wheeled) Transfers: Sit to/from Stand Sit to Stand: Min assist;From elevated surface Stand pivot transfers: Min assist General transfer comment: Min assist for boost from lowest bed setting and BSC. VC for hand placement and cues to maintain weight bearing status on LLE. Pivot transfer towards pts left side today onto  [facility]. Good ability to adjust himself safely once sitting on commode. Good control with descent into chair. Ambulation/Gait Ambulation/Gait assistance: Min assist Ambulation Distance (Feet): 4 Feet Assistive device: Rolling walker (2 wheeled) Gait Pattern/deviations: Step-to pattern;Decreased step length - right;Decreased step length - left;Decreased stance time - left;Decreased dorsiflexion - left;Shuffle;Antalgic;Narrow base of support;Trunk flexed Gait velocity interpretation: Below normal speed for age/gender General Gait Details: Min assist for walker placement. Moderate VC for sequencing and to maintain TDWB on LLE. Instructions for increased UE use. Pt shuffling Lt foot forward with decreased hip flexion and dorsiflexion. Also shuffles RLE forward but was able to take a couple of smaller steps today with improved use of rolling walker for support.     ADL: ADL Overall ADL's : Needs assistance/impaired Eating/Feeding: Independent;Sitting Grooming: Set up;Sitting Upper Body Bathing: Set up;Sitting Lower Body Bathing: Minimal assistance;With adaptive equipment Upper Body Dressing : Set up;Sitting Lower Body Dressing: Moderate assistance;With adaptive equipment  Cognition: Cognition Overall Cognitive Status: Within Functional Limits for tasks assessed Orientation Level: Oriented X4 Cognition Arousal/Alertness: Awake/alert Behavior During Therapy: WFL for tasks assessed/performed Overall Cognitive Status: Within Functional Limits for tasks assessed  Blood pressure 131/73, pulse 98, temperature 98.6 F (37 C), temperature source Oral, resp. rate 16, height 5\' 4"  (1.626 m), weight 122.471 kg (270 lb), SpO2 93.00%. Physical Exam  Nursing note and vitals reviewed. Constitutional: He is oriented to person, place, and time. He appears well-developed and well-nourished.  Morbidly obese male. NAD.  HENT:  Head: Normocephalic and atraumatic.  Eyes: Conjunctivae are normal. Pupils are equal, round, and reactive to light.  Neck: Normal range of motion. Neck supple.  Cardiovascular: Normal rate and regular rhythm.   Respiratory: Effort normal and breath sounds normal.  GI: Soft. Bowel sounds  are normal. He exhibits no distension. There is no tenderness.  Musculoskeletal:  2+ edema RLE. BLE with stasis changes. Left hip and knee limited by pain/surgery.   Neurological: He is alert and oriented to person, place, and time. No cranial nerve deficit. Coordination normal.  UE 5/5. LLE limited due to pain. RLE: 4/5. Decreased PP/LT in both feet.   Skin: Skin is warm and dry.  Psychiatric: He has a normal mood and affect. His behavior is normal. Judgment and thought content normal.    Results for orders placed during the hospital encounter of 09/21/14 (from the past 24 hour(s))  GLUCOSE, CAPILLARY     Status: Abnormal   Collection Time     09/22/14 12:27 PM      Result Value Ref Range   Glucose-Capillary 217 (*) 70 - 99 mg/dL  GLUCOSE, CAPILLARY     Status: Abnormal   Collection Time    09/22/14  5:02 PM      Result Value Ref Range   Glucose-Capillary 233 (*) 70 - 99 mg/dL  GLUCOSE, CAPILLARY     Status: Abnormal   Collection Time    09/22/14  5:59 PM      Result Value Ref Range   Glucose-Capillary 218 (*) 70 - 99 mg/dL  GLUCOSE, CAPILLARY     Status: Abnormal   Collection Time    09/22/14 10:07 PM      Result Value Ref Range   Glucose-Capillary 155 (*) 70 - 99 mg/dL  CBC     Status: Abnormal   Collection Time    09/23/14  5:40 AM      Result Value Ref Range   WBC 8.8  4.0 - 10.5 K/uL   RBC 3.91 (*) 4.22 - 5.81 MIL/uL   Hemoglobin 11.4 (*) 13.0 - 17.0 g/dL   HCT 33.3 (*) 39.0 - 52.0 %   MCV 85.2  78.0 - 100.0 fL   MCH 29.2  26.0 - 34.0 pg   MCHC 34.2  30.0 - 36.0 g/dL   RDW 13.7  11.5 - 15.5 %   Platelets 151  150 - 400 K/uL  BASIC METABOLIC PANEL     Status: Abnormal   Collection Time    09/23/14  5:40 AM      Result Value Ref Range   Sodium 135 (*) 137 - 147 mEq/L   Potassium 3.8  3.7 - 5.3 mEq/L   Chloride 97  96 - 112 mEq/L   CO2 26  19 - 32 mEq/L   Glucose, Bld 190 (*) 70 - 99 mg/dL   BUN 17  6 - 23 mg/dL   Creatinine, Ser 0.61  0.50 - 1.35 mg/dL   Calcium 8.1 (*) 8.4 - 10.5 mg/dL   GFR calc non Af Amer >90  >90 mL/min   GFR calc Af Amer >90  >90 mL/min   Anion gap 12  5 - 15  GLUCOSE, CAPILLARY     Status: Abnormal   Collection Time    09/23/14  6:47 AM      Result Value Ref Range   Glucose-Capillary 180 (*) 70 - 99 mg/dL   Dg Hip Operative Left  09/21/2014   CLINICAL DATA:  Status post ORIF with placement of an intra trochanteric nail ; 46 seconds of fluoro time reported  EXAM: OPERATIVE LEFT HIP ; 4 fluoro spot views  COMPARISON:  Preoperative femur series of September 21, 2014  FINDINGS: The patient has undergone placement of a telescoping nail with intra  medullary rod for fixation of  intertrochanteric fracture. Radiographic positioning of the metallic components is good. Alignment of the fracture fragments is near anatomic.  IMPRESSION: There is no immediate postprocedure complication following ORIF of the left posttraumatic intertrochanteric fracture.   Electronically Signed   By: David  Martinique   On: 09/21/2014 16:31   Dg Chest Port 1 View  09/21/2014   CLINICAL DATA:  Shortness of breath.  Initial encounter.  EXAM: PORTABLE CHEST - 1 VIEW  COMPARISON:  09/21/2014; 08/02/2010; 08/01/2010  FINDINGS: The examination is markedly degraded due to patient body habitus and portable technique.  Grossly unchanged enlarged cardiac silhouette and mediastinal contours. There is persistent rightward deviation of the tracheal air column at the level of the aortic arch. There is persistent elevation of the right hemidiaphragm with associated bibasilar opacities. There is unchanged mild diffuse slightly nodular thickening of the pulmonary interstitium. No new focal airspace opacities. No pleural effusion pneumothorax. No evidence of edema. Unchanged bones.  IMPRESSION: 1. No definite acute cardiopulmonary disease on this hypoventilated AP portable examination. Further evaluation with a PA and lateral chest radiograph may be obtained as clinically indicated. 2. Similar findings of cardiomegaly and prior elevation of the right hemidiaphragm.   Electronically Signed   By: Sandi Mariscal M.D.   On: 09/21/2014 12:51    Assessment/Plan: Diagnosis: left IT hip fx after fall 1. Does the need for close, 24 hr/day medical supervision in concert with the patient's rehab needs make it unreasonable for this patient to be served in a less intensive setting? Yes 2. Co-Morbidities requiring supervision/potential complications: morbid obesity, type 2 dm with DPN 3. Due to bladder management, bowel management, safety, skin/wound care, disease management, medication administration, pain management and patient education,  does the patient require 24 hr/day rehab nursing? Yes 4. Does the patient require coordinated care of a physician, rehab nurse, PT (1-2 hrs/day, 5 days/week) and OT (1-2 hrs/day, 5 days/week) to address physical and functional deficits in the context of the above medical diagnosis(es)? Yes Addressing deficits in the following areas: balance, endurance, locomotion, strength, transferring, bowel/bladder control, bathing, dressing, feeding, grooming and toileting 5. Can the patient actively participate in an intensive therapy program of at least 3 hrs of therapy per day at least 5 days per week? Yes 6. The potential for patient to make measurable gains while on inpatient rehab is excellent 7. Anticipated functional outcomes upon discharge from inpatient rehab are modified independent  with PT, modified independent with OT, n/a with SLP. 8. Estimated rehab length of stay to reach the above functional goals is: 7 days 9. Does the patient have adequate social supports to accommodate these discharge functional goals? Yes 10. Anticipated D/C setting: Home 11. Anticipated post D/C treatments: [place] therapy 12. Overall Rehab/Functional Prognosis: excellent  RECOMMENDATIONS: This patient's condition is appropriate for continued rehabilitative care in the following setting: CIR Patient has agreed to participate in recommended program. Yes Note that insurance prior authorization may be required for reimbursement for recommended care.  Comment: Rehab Admissions Coordinator to follow up.  Thanks,  Meredith Staggers, MD, Mellody Drown     09/23/2014

## 2014-09-23 NOTE — Progress Notes (Signed)
Rehab Admissions Coordinator Note:  Patient was screened by Cleatrice Burke for appropriateness for an Inpatient Acute Rehab Consult per PT recommendation.  At this time, we are recommending Inpatient Rehab consult.  Cleatrice Burke 09/23/2014, 9:13 AM  I can be reached at [phone].

## 2014-09-23 NOTE — Clinical Social Work Psychosocial (Signed)
Clinical Social Work Department BRIEF PSYCHOSOCIAL ASSESSMENT 09/23/2014  Patient:  ORLO, BRICKLE     Account Number:  [id number]     Admit date:  09/21/2014  Clinical Social Worker:  Marciano Sequin  Date/Time:  09/23/2014 04:27 PM  Referred by:  RN  Date Referred:  09/23/2014 Referred for  SNF Placement   Other Referral:   Interview type:  Patient Other interview type:    PSYCHOSOCIAL DATA Living Status:  ALONE Admitted from facility:   Level of care:   Primary support name:  Dillard,Tommy Primary support relationship to patient:  CHILD, ADULT Degree of support available:   Strong    CURRENT CONCERNS  Other Concerns:    SOCIAL WORK ASSESSMENT / PLAN CSW met the pt by the  bedside. CSW introduce self and purpose of visit. Pt presented with a normal affect and calm mood. CSW and pt discussed the clinical team recommendations for rehab. CSW and pt reviewed the difference between CIR and SNF rehab.  CSW explained the SNF process to the pt. CSW provided the pt with contact information for further questions. CSW will continue to follow this pt and assist with discharge as needed.   Assessment/plan status:  Information/Referral to [employer] Other assessment/ plan:   Information/referral to community resources:   SNF list    PATIENT'S/FAMILY'S RESPONSE TO PLAN OF CARE: agreed   Greta Doom, MSW, [place]

## 2014-09-23 NOTE — Progress Notes (Signed)
Utilization review completed.  

## 2014-09-23 NOTE — Progress Notes (Signed)
Physical Therapy Treatment Patient Details Name: Jeffery Nelson MRN: [id number] DOB: 03-Feb-1946 Today's Date: 09/23/2014    History of Present Illness 68 y/o male with PMH of IDDM, HTN, HPL, lymphedema is admitted with mechanical fall and L hip fracture. s/p left hip intertrochanteric fracture    PT Comments    Patient progressing slowly towards physical therapy goals, able to ambulate up to 4 feet today but improving ability to maintain weight-bearing precautions on LLE. Min assist with transfers and ambulation. Motivated to improve and tolerated exercises well. Pannus developing odor, absorbed moisture with towel and informed RN  Follow Up Recommendations  CIR     Equipment Recommendations  3in1 (PT)    Recommendations for Other Services Rehab consult;OT consult     Precautions / Restrictions Precautions Precautions: Fall Restrictions Weight Bearing Restrictions: Yes LLE Weight Bearing: Touchdown weight bearing    Mobility  Bed Mobility Overal bed mobility: Needs Assistance Bed Mobility: Supine to Sit     Supine to sit: Min assist;HOB elevated     General bed mobility comments: Min assist for LLE support out of bed. Heavy use of rail. VC for technique. Able to scoot self forward to edge of bed but with great effort  Transfers Overall transfer level: Needs assistance Equipment used: Rolling walker (2 wheeled) Transfers: Sit to/from Stand Sit to Stand: Min assist;From elevated surface Stand pivot transfers: Min assist       General transfer comment: Min assist for boost from lowest bed setting and BSC. VC for hand placement and cues to maintain weight bearing status on LLE. Pivot transfer towards pts left side today onto  [facility]. Good ability to adjust himself safely once sitting on commode. Good control with descent into chair.  Ambulation/Gait Ambulation/Gait assistance: Min assist Ambulation Distance (Feet): 4 Feet Assistive device: Rolling walker (2  wheeled) Gait Pattern/deviations: Step-to pattern;Decreased step length - right;Decreased step length - left;Decreased stance time - left;Decreased dorsiflexion - left;Shuffle;Antalgic;Narrow base of support;Trunk flexed   Gait velocity interpretation: Below normal speed for age/gender General Gait Details: Min assist for walker placement. Moderate VC for sequencing and to maintain TDWB on LLE. Instructions for increased UE use. Pt shuffling Lt foot forward with decreased hip flexion and dorsiflexion. Also shuffles RLE forward but was able to take a couple of smaller steps today with improved use of rolling walker for support.   Stairs            Wheelchair Mobility    Modified Rankin (Stroke Patients Only)       Balance                                    Cognition Arousal/Alertness: Awake/alert Behavior During Therapy: WFL for tasks assessed/performed Overall Cognitive Status: Within Functional Limits for tasks assessed                      Exercises General Exercises - Lower Extremity Ankle Circles/Pumps: AROM;Both;10 reps;Seated Long Arc Quad: Strengthening;Seated;Both;15 reps Heel Slides: AAROM;Left;10 reps;Seated Hip ABduction/ADduction: AAROM;Left;10 reps;Seated Straight Leg Raises: AAROM;Left;10 reps;Seated    General Comments General comments (skin integrity, edema, etc.): Pannus moist with developing odor. Dried with towel. Informed RN, recommended anti-fungal powder vs silver absorption pads.      Pertinent Vitals/Pain Pain Assessment: 0-10 Pain Score:  ("a little sore today" no numerical value given) Pain Location: Lt hip Pain Intervention(s): Monitored during session;Repositioned  Home Living                      Prior Function            PT Goals (current goals can now be found in the care plan section) Acute Rehab PT Goals PT Goal Formulation: With patient Time For Goal Achievement: 09/29/14 Potential to Achieve  Goals: Good Progress towards PT goals: Progressing toward goals    Frequency  Min 5X/week    PT Plan Current plan remains appropriate    Co-evaluation             End of Session Equipment Utilized During Treatment: Gait belt Activity Tolerance: Patient tolerated treatment well Patient left: in chair;with call bell/phone within reach     Time: 2694-8546 PT Time Calculation (min): 33 min  Charges:  $Therapeutic Exercise: 8-22 mins $Therapeutic Activity: 8-22 mins                    G Codes:      Elayne Snare, [place]  Ellouise Newer 09/23/2014, 10:09 AM

## 2014-09-24 LAB — GLUCOSE, CAPILLARY
GLUCOSE-CAPILLARY: 108 mg/dL — AB (ref 70–99)
GLUCOSE-CAPILLARY: 157 mg/dL — AB (ref 70–99)
Glucose-Capillary: 125 mg/dL — ABNORMAL HIGH (ref 70–99)
Glucose-Capillary: 144 mg/dL — ABNORMAL HIGH (ref 70–99)

## 2014-09-24 LAB — CBC
HCT: 28.7 % — ABNORMAL LOW (ref 39.0–52.0)
HEMOGLOBIN: 9.9 g/dL — AB (ref 13.0–17.0)
MCH: 28.9 pg (ref 26.0–34.0)
MCHC: 34.5 g/dL (ref 30.0–36.0)
MCV: 83.9 fL (ref 78.0–100.0)
PLATELETS: 137 10*3/uL — AB (ref 150–400)
RBC: 3.42 MIL/uL — ABNORMAL LOW (ref 4.22–5.81)
RDW: 13.4 % (ref 11.5–15.5)
WBC: 6.4 10*3/uL (ref 4.0–10.5)

## 2014-09-24 MED ORDER — INSULIN ASPART 100 UNIT/ML ~~LOC~~ SOLN: 0.0000 [IU] | Freq: Three times a day (TID) | SUBCUTANEOUS | Status: DC

## 2014-09-24 MED ORDER — LEVALBUTEROL HCL 0.63 MG/3ML IN NEBU
0.6300 mg | INHALATION_SOLUTION | Freq: Four times a day (QID) | RESPIRATORY_TRACT | Status: DC | PRN
Start: 2014-09-24 — End: 2015-06-03

## 2014-09-24 MED ORDER — INSULIN ASPART PROT & ASPART (70-30 MIX) 100 UNIT/ML ~~LOC~~ SUSP: 40.0000 [IU] | Freq: Two times a day (BID) | SUBCUTANEOUS | Status: DC

## 2014-09-24 NOTE — Clinical Social Work Note (Signed)
CSW reviewed chart. CSW contacted Inpatient Rehab Coordinator to confirm status of acceptance for the pt. Inpatient Rehab Coordinator reported pt's acceptance is pending insurance authorization approve. CSW will continue to follow this pt and provided assistance as needed.   Brevig Mission, MSW, [place]

## 2014-09-24 NOTE — Progress Notes (Signed)
Inpatient Rehabilitation  I met with Mr. Jeffery Nelson at the bedside to discuss his post acute rehab options.  He prefers IP Rehab if insurance approves.  I left informational booklets with him and answered his questions.  I will initiate insurance authorization process.  His admission to rehab is pending insurance approval and bed availability.   Please call if questions.  [place] Admissions Coordinator Cell [phone] Office [phone]

## 2014-09-24 NOTE — Discharge Summary (Signed)
Physician Discharge Summary  Jeffery Nelson NWG:[id number] DOB: Apr 08, 1946 DOA: 09/21/2014  PCP: Stephens Shire, MD  Admit date: 09/21/2014 Discharge date: 09/24/2014  Time spent: >35 minutes  Recommendations for Outpatient Follow-up:  CIR F/u with ortho in 3 week s F/u with PCP in 1 week post discharge  Discharge Diagnoses:  Active Problems:   Hip fracture   Discharge Condition: stable   Diet recommendation: DM, low sodium   Filed Weights   09/21/14 0819  Weight: 122.471 kg (270 lb)    History of present illness:  68 y/o male with PMH of IDDM, HTN, HPL, lymphedema is admitted with mechanical fall and L hip fracture   Hospital Course:  1. L hi[p fracture  -10/3 s/p Left hip intertrochanteric fracture; per ortho on lovenox for DVT prophylaxis  -CIR rehab;  2. IDDM; Pt reports taking 60 Units insulin (70/30) TID but decreased due to mild hypoglycemia  -HA1c-7.0; cont mealtime insulin decreased to 40 to BID,+ISS; monitor, outpatient triradiation as needed   3. HTN, cont home regimen  4. Lymphedema; cont lasix  D/c: awaiting rehab admission;    Procedures:  L hip (i.e. Studies not automatically included, echos, thoracentesis, etc; not x-rays)  Consultations:  ortho  Discharge Exam: Filed Vitals:   09/24/14 0549  BP: 154/72  Pulse: 83  Temp: 97.9 F (36.6 C)  Resp: 19    General: alert Cardiovascular: s1,s2 rrr Respiratory: CTA BL  Discharge Instructions  Discharge Instructions   Diet - low sodium heart healthy    Complete by:  As directed      Discharge instructions    Complete by:  As directed   Please follow up with orthopedics in 3 weeks     Increase activity slowly    Complete by:  As directed      Partial weight bearing    Complete by:  As directed   % Body Weight:  25%  Laterality:  left  Extremity:  Lower            Medication List    STOP taking these medications       insulin lispro protamine-lispro (75-25) 100 UNIT/ML Susp  injection  Commonly known as:  HUMALOG 75/25 MIX  Replaced by:  insulin aspart protamine- aspart (70-30) 100 UNIT/ML injection      TAKE these medications       colchicine 0.6 MG tablet  Take 0.6 mg by mouth as needed (for gout).     enoxaparin 40 MG/0.4ML injection  Commonly known as:  LOVENOX  Inject 0.4 mLs (40 mg total) into the skin daily.     fluocinonide cream 0.05 %  Commonly known as:  LIDEX  Apply 1 application topically as needed (rash).     FLUoxetine 20 MG tablet  Commonly known as:  PROZAC  Take 20 mg by mouth daily.     furosemide 20 MG tablet  Commonly known as:  LASIX  Take 20 mg by mouth daily.     insulin aspart 100 UNIT/ML injection  Commonly known as:  novoLOG  Inject 0-9 Units into the skin 3 (three) times daily with meals.     insulin aspart protamine- aspart (70-30) 100 UNIT/ML injection  Commonly known as:  NOVOLOG MIX 70/30  Inject 0.4 mLs (40 Units total) into the skin 2 (two) times daily with a meal.     levalbuterol 0.63 MG/3ML nebulizer solution  Commonly known as:  XOPENEX  Take 3 mLs (0.63 mg total) by nebulization every  6 (six) hours as needed for wheezing or shortness of breath.     multivitamin with minerals Tabs tablet  Take 1 tablet by mouth daily.     OVER THE COUNTER MEDICATION  Take 1 tablet by mouth daily. Calcium 333 mg with Magnesium 133 mg and Zinc 5 mg tablets     oxyCODONE 5 MG immediate release tablet  Commonly known as:  Oxy IR/ROXICODONE  Take 1-2 tablets (5-10 mg total) by mouth every 4 (four) hours as needed for moderate pain or breakthrough pain.     potassium gluconate 595 MG Tabs tablet  Take 595 mg by mouth daily.     quinapril 20 MG tablet  Commonly known as:  ACCUPRIL  Take 20 mg by mouth daily.     rOPINIRole 4 MG tablet  Commonly known as:  REQUIP  Take 4 mg by mouth daily.     rosuvastatin 20 MG tablet  Commonly known as:  CRESTOR  Take 20 mg by mouth daily.       Allergies  Allergen  Reactions  . Lipitor [Atorvastatin]   . Lyrica [Pregabalin]        Follow-up Information   Follow up with [name], MD In 3 weeks.   Specialty:  Orthopedic Surgery   Contact information:   [address] [place] [address] [phone]       Follow up with BURNETT,BRENT A, MD In 1 week.   Specialty:  Family Medicine   Contact information:   [address] [place] [address] [phone]        The results of significant diagnostics from this hospitalization (including imaging, microbiology, ancillary and laboratory) are listed below for reference.    Significant Diagnostic Studies: Dg Ribs Unilateral W/chest Left  09/21/2014   CLINICAL DATA:  Post fall, now with left lower anterior rib pain. History diabetes. Initial encounter.  EXAM: LEFT RIBS AND CHEST - 3+ VIEW  COMPARISON:  Chest radiograph- 08/04/2010; 08/02/2010  FINDINGS: Examination is degraded due to patient body habitus.  Grossly unchanged enlarged cardiac silhouette and mediastinal contours given persistently reduced lung volumes. There is unchanged elevation of the right hemi now overall improved aeration of the lung bases with persistent right infrahilar opacities. No pleural effusion or pneumothorax. No evidence of edema.  No definite displaced left-sided rib fractures with special attention paid to the area demarcated by the radiopaque BB. Regional soft tissues appear normal. No radiopaque foreign body.  Post L2 vertebroplasty/kyphoplasty.  Known comminuted left-sided intertrochanteric femur fracture is suboptimally evaluated  IMPRESSION: 1. No definite acute cardiopulmonary disease on this degraded hypoventilated examination. Specifically, no definite displaced left-sided rib fractures with special attention paid to the radiopaque BB. 2. Chronic elevation of the right hemidiaphragm with associated right infrahilar opacities, likely atelectasis. 3. Known comminuted left-sided  intertrochanteric femur fracture, incompletely evaluated. Please refer to dedicated left femur and pelvis radiographs performed earlier same day.   Electronically Signed   By: Sandi Mariscal M.D.   On: 09/21/2014 10:15   Dg Pelvis 1-2 Views  09/21/2014   CLINICAL DATA:  Left hip pain.  Initial encounter.  EXAM: PELVIS - 1-2 VIEW  COMPARISON:  Left hip radiograph-earlier same day  FINDINGS: There is a comminuted and displaced intertrochanteric left-sided femur fracture with associated foreshortening and mild varus angulation. No definite intra-articular extension. No definite displaced pelvic fracture. A bone island is noted within the anterior aspect of the right ilium limited visualization the contralateral right  hip is normal. Regional soft tissues are normal. No radiopaque foreign body.  IMPRESSION: 1. Comminuted, minimally displaced left-sided intertrochanteric femur fracture. Please refer to dedicated left hip radiographs performed earlier same day. 2. No definite displaced pelvic fracture.   Electronically Signed   By: Sandi Mariscal M.D.   On: 09/21/2014 10:10   Dg Hip Operative Left  09/21/2014   CLINICAL DATA:  Status post ORIF with placement of an intra trochanteric nail ; 46 seconds of fluoro time reported  EXAM: OPERATIVE LEFT HIP ; 4 fluoro spot views  COMPARISON:  Preoperative femur series of September 21, 2014  FINDINGS: The patient has undergone placement of a telescoping nail with intra medullary rod for fixation of intertrochanteric fracture. Radiographic positioning of the metallic components is good. Alignment of the fracture fragments is near anatomic.  IMPRESSION: There is no immediate postprocedure complication following ORIF of the left posttraumatic intertrochanteric fracture.   Electronically Signed   By: David  Martinique   On: 09/21/2014 16:31   Dg Femur Left  09/21/2014   CLINICAL DATA:  Left hip pain.  Initial encounter.  EXAM: LEFT FEMUR - 2 VIEW  COMPARISON:  80 pelvic radiographs -  earlier same day  FINDINGS: There is a comminuted and minimally displaced left-sided intertrochanteric femur fracture with associated foreshortening and varus angulation. No definite intra-articular extension. No dislocation. Minimal amount of expected subcutaneous emphysema. No radiopaque foreign body.  No additional fractures are identified. Limited visualization of the adjacent knee is normal given obliquity. Vascular calcifications are noted at the level of the mid thigh.  IMPRESSION: Comminuted and minimally displaced left-sided intertrochanteric femur fracture with foreshortening and accentuated varus angulation. No definite intra-articular extension.   Electronically Signed   By: Sandi Mariscal M.D.   On: 09/21/2014 10:12   Dg Chest Port 1 View  09/21/2014   CLINICAL DATA:  Shortness of breath.  Initial encounter.  EXAM: PORTABLE CHEST - 1 VIEW  COMPARISON:  09/21/2014; 08/02/2010; 08/01/2010  FINDINGS: The examination is markedly degraded due to patient body habitus and portable technique.  Grossly unchanged enlarged cardiac silhouette and mediastinal contours. There is persistent rightward deviation of the tracheal air column at the level of the aortic arch. There is persistent elevation of the right hemidiaphragm with associated bibasilar opacities. There is unchanged mild diffuse slightly nodular thickening of the pulmonary interstitium. No new focal airspace opacities. No pleural effusion pneumothorax. No evidence of edema. Unchanged bones.  IMPRESSION: 1. No definite acute cardiopulmonary disease on this hypoventilated AP portable examination. Further evaluation with a PA and lateral chest radiograph may be obtained as clinically indicated. 2. Similar findings of cardiomegaly and prior elevation of the right hemidiaphragm.   Electronically Signed   By: Sandi Mariscal M.D.   On: 09/21/2014 12:51    Microbiology: No results found for this or any previous visit (from the past 240 hour(s)).    Labs: Basic Metabolic Panel:  Recent Labs Lab 09/21/14 0957 09/21/14 1128 09/22/14 0356 09/23/14 0540  NA 139  --  132* 135*  K 4.0  --  4.5 3.8  CL 99  --  95* 97  CO2 27  --  27 26  GLUCOSE 157*  --  229* 190*  BUN 17  --  21 17  CREATININE 0.63 0.62 0.74 0.61  CALCIUM 8.6  --  7.7* 8.1*   Liver Function Tests:  Recent Labs Lab 09/21/14 1128  AST 21  ALT 18  ALKPHOS 107  BILITOT 0.8  PROT  6.7  ALBUMIN 3.8   No results found for this basename: LIPASE, AMYLASE,  in the last 168 hours No results found for this basename: AMMONIA,  in the last 168 hours CBC:  Recent Labs Lab 09/21/14 0957 09/21/14 1128 09/22/14 0356 09/23/14 0540 09/24/14 0545  WBC 13.1* 11.2* 8.5 8.8 6.4  NEUTROABS 11.3*  --   --   --   --   HGB 14.5 13.4 11.5* 11.4* 9.9*  HCT 42.3 40.4 34.5* 33.3* 28.7*  MCV 84.3 84.0 84.6 85.2 83.9  PLT 140* 133* 147* 151 137*   Cardiac Enzymes:  Recent Labs Lab 09/21/14 1128  TROPONINI <0.30   BNP: BNP (last 3 results)  Recent Labs  09/21/14 1132  PROBNP 382.8*   CBG:  Recent Labs Lab 09/23/14 0647 09/23/14 1158 09/23/14 1559 09/23/14 2216 09/24/14 0643  GLUCAP 180* 155* 133* 154* 108*       Signed:  Ioannis Schuh N  [facility] 09/24/2014, 11:09 AM

## 2014-09-24 NOTE — Progress Notes (Signed)
Subjective: 3 Days Post-Op Procedure(s) (LRB): left hip intertroch fracture (Left)  Patient reports pain as mild.  He has some pain at his incision site, but it has been well controlled with PO medications. He has no new complaints at this time. He was very pleased to find out that his latest HbA1C had dropped to 7 from 10.  He has done well with PT. He denies any new complaints of HA, CP, SOB, N, V, fever, chills, calf swelling or pain.   Objective:   VITALS:  Temp:  [97.9 F (36.6 C)-99.3 F (37.4 C)] 97.9 F (36.6 C) (10/06 0549) Pulse Rate:  [83-95] 83 (10/06 0549) Resp:  [16-20] 18 (10/06 1159) BP: (128-154)/(48-72) 154/72 mmHg (10/06 0549) SpO2:  [93 %-98 %] 98 % (10/06 1159)   WD/WN caucasian male in nad. A and O x4. Mood and affect appropriate. EOMI. Respirations normal and unlabored. Abdomen soft and non-tender. Dressings to left lateral thigh C/D/I. NV intact with brisk capillary refill and 5/5 strength of toe and ankle extensors and flexors bilaterally. Distal sensation intact bilaterally. No calf swelling or palpable cords. No lymphadenopathy.   LABS  Recent Labs  09/22/14 0356 09/23/14 0540 09/24/14 0545  HGB 11.5* 11.4* 9.9*  WBC 8.5 8.8 6.4  PLT 147* 151 137*    Recent Labs  09/22/14 0356 09/23/14 0540  NA 132* 135*  K 4.5 3.8  CL 95* 97  CO2 27 26  BUN 21 17  CREATININE 0.74 0.61  GLUCOSE 229* 190*   No results found for this basename: LABPT, INR,  in the last 72 hours   Assessment/Plan: 3 Days Post-Op Procedure(s) (LRB): left hip intertroch fracture (Left)  -Up with PT; TTWB LLE  -Lovenox for DVT prophylaxis; Rx in chart for D/C  -PO pain medications for pain control; Rx in chart for D/C  -Continue hip precautions  -Continue with IM recs for other medical problems  -D/C to SNF per IM team    Juniper Snyders HOWELLS 09/24/2014, 12:08 PM [phone]

## 2014-09-24 NOTE — Progress Notes (Signed)
PT Cancellation Note  Patient Details Name: Jeffery Nelson MRN: [id number] DOB: 1946-01-07   Cancelled Treatment:    Reason Eval/Treat Not Completed: Patient declined, no reason specified. Patient requesting for PT to come back as he had visitors at this time. Will follow up as time allows    Jacqualyn Posey 09/24/2014, 11:29 AM

## 2014-09-24 NOTE — Progress Notes (Signed)
Physical Therapy Treatment Patient Details Name: Jeffery Nelson MRN: [id number] DOB: 1946/11/26 Today's Date: 09/24/2014    History of Present Illness 68 y/o male with PMH of IDDM, HTN, HPL, lymphedema is admitted with mechanical fall and L hip fracture. s/p IM nail    PT Comments    Patient eager to return to bed due to fatigue and had been up in recliner for a while. Difficulty maintaining TWB this session. Unable to hop. Continue to recommend comprehensive inpatient rehab (CIR) for post-acute therapy needs.   Follow Up Recommendations  CIR     Equipment Recommendations  3in1 (PT)    Recommendations for Other Services       Precautions / Restrictions Precautions Precautions: Fall Restrictions LLE Weight Bearing: Touchdown weight bearing    Mobility  Bed Mobility   Bed Mobility: Sit to Supine       Sit to supine: Mod assist   General bed mobility comments: Mod A for LEs and for positioning back into bed. Cues for technique and on proper postioining. Patient able to use rails to boost himself back into bed.   Transfers Overall transfer level: Needs assistance Equipment used: Rolling walker (2 wheeled)   Sit to Stand: Min assist Stand pivot transfers: Mod assist       General transfer comment: Min A to power up into standing and A for RW management and to assist with TWB on L side. Patient having difficulty this session maintaining TWB and unable to hop this session due to fatigue  Ambulation/Gait             General Gait Details: unable this session   Stairs            Wheelchair Mobility    Modified Rankin (Stroke Patients Only)       Balance                                    Cognition Arousal/Alertness: Awake/alert Behavior During Therapy: WFL for tasks assessed/performed Overall Cognitive Status: Within Functional Limits for tasks assessed                      Exercises      General Comments         Pertinent Vitals/Pain Pain Score: 5  Pain Location: L hip Pain Descriptors / Indicators: Aching Pain Intervention(s): Monitored during session    Home Living                      Prior Function            PT Goals (current goals can now be found in the care plan section) Progress towards PT goals: Progressing toward goals (slowly)    Frequency  Min 5X/week    PT Plan Current plan remains appropriate    Co-evaluation             End of Session Equipment Utilized During Treatment: Gait belt Activity Tolerance: Patient limited by fatigue Patient left: in bed;with call bell/phone within reach     Time: 1335-1351 PT Time Calculation (min): 16 min  Charges:  $Therapeutic Activity: 8-22 mins                    G Codes:      Jacqualyn Posey 09/24/2014, 2:04 PM  09/24/2014 Jacqualyn Posey PTA [phone] pager [phone] office

## 2014-09-24 NOTE — Progress Notes (Signed)
Pt refused both of his 70/30 insulin during shift. No acute distress or discomfort reported or seen during shift. Reported off to incoming RN. Delia Heady RN

## 2014-09-25 LAB — GLUCOSE, CAPILLARY
GLUCOSE-CAPILLARY: 138 mg/dL — AB (ref 70–99)
GLUCOSE-CAPILLARY: 91 mg/dL (ref 70–99)
Glucose-Capillary: 186 mg/dL — ABNORMAL HIGH (ref 70–99)
Glucose-Capillary: 153 mg/dL — ABNORMAL HIGH (ref 70–99)

## 2014-09-25 NOTE — Progress Notes (Signed)
Subjective: 4 Days Post-Op Procedure(s) (LRB): left hip intertroch fracture (Left) Patient reports pain as mild.  He has no new complaints today and is eager to get to rehab. He was told that the plan would be to get him to inpatient rehab today. He has done well with PT to this point. He did have some trouble sleeping last night, but states that is normal for him periodically. His pain is well controlled with PO pain medications. He denies any new HA, CP, SOB, fever, chills, N, V, calf swelling or pain.   Objective: Vital signs in last 24 hours: Temp:  [98.2 F (36.8 C)-98.6 F (37 C)] 98.6 F (37 C) (10/07 0503) Pulse Rate:  [83-86] 83 (10/07 0503) Resp:  [15-18] 16 (10/07 0750) BP: (123-149)/(61-69) 149/69 mmHg (10/07 0503) SpO2:  [97 %-99 %] 98 % (10/07 0750)  Intake/Output from previous day: 10/06 0701 - 10/07 0700 In: 1020 [P.O.:1020] Out: 900 [Urine:900] Intake/Output this shift: Total I/O In: 240 [P.O.:240] Out: -    Recent Labs  09/23/14 0540 09/24/14 0545  HGB 11.4* 9.9*    Recent Labs  09/23/14 0540 09/24/14 0545  WBC 8.8 6.4  RBC 3.91* 3.42*  HCT 33.3* 28.7*  PLT 151 137*    Recent Labs  09/23/14 0540  NA 135*  K 3.8  CL 97  CO2 26  BUN 17  CREATININE 0.61  GLUCOSE 190*  CALCIUM 8.1*   No results found for this basename: LABPT, INR,  in the last 72 hours  WD/WN caucasian male in nad. A and O x4. Mood and affect appropriate. EOMI. Respirations normal and unlabored. Abdomen soft and non-tender. Dressings to left lateral thigh C/D/I. NV intact with brisk capillary refill and 5/5 strength of toe and ankle extensors and flexors bilaterally. Distal sensation intact bilaterally. No calf swelling or palpable cords. No lymphadenopathy.    Assessment/Plan: 4 Days Post-Op Procedure(s) (LRB): left hip intertroch fracture (Left) -Up with PT; TTWB LLE  -Lovenox for DVT prophylaxis; Rx in chart for D/C  -PO pain medications for pain control; Rx in chart  for D/C  -Continue hip precautions  -Continue with IM recs for other medical problems  -D/C to inpatient rehab per IM team    Channel Papandrea HOWELLS 09/25/2014, 8:21 AM [phone]

## 2014-09-25 NOTE — Progress Notes (Signed)
Inpatient Rehabilitation  I have alerted pt that I am still  awaiting insurance company decision on CIR.  I have discussed with Dysheka SW.  Pt. Has requested [employer] as his first choice and [employer] as his second choice IF insurance does not approve CIR.  Please call if questions.  [place] Admissions Coordinator Cell [phone] Office [phone]

## 2014-09-25 NOTE — Progress Notes (Signed)
[facility]                                                                                Patient Demographics  Jeffery Nelson, is a 68 y.o. male  NFA:[id number]  ION:[id number]  DOB - 1946-05-15  Admit date - 09/21/2014  Admitting Physician Reyne Dumas, MD  Outpatient Primary MD for the patient is Stephens Shire, MD  LOS - 4   Chief Complaint  Patient presents with  . Fall        Assessment & Plan    Patient seen briefly today due for discharge soon per Discharge done yesterday by Dr Daleen Bo, no further issues, Vital signs stable, patient feels fine.      Medications  Scheduled Meds: . docusate sodium  100 mg Oral BID  . enoxaparin (LOVENOX) injection  40 mg Subcutaneous Q24H  . FLUoxetine  20 mg Oral Daily  . furosemide  20 mg Oral Daily  . insulin aspart  0-5 Units Subcutaneous QHS  . insulin aspart  0-9 Units Subcutaneous TID WC  . insulin aspart protamine- aspart  40 Units Subcutaneous BID WC  . pneumococcal 23 valent vaccine  0.5 mL Intramuscular Tomorrow-1000  . quinapril  20 mg Oral Daily  . rOPINIRole  4 mg Oral Daily  . rosuvastatin  20 mg Oral q1800   Continuous Infusions:  PRN Meds:.acetaminophen, acetaminophen, calcium carbonate, HYDROmorphone (DILAUDID) injection, levalbuterol, menthol-cetylpyridinium, oxyCODONE, phenol, sodium chloride    Time Spent in minutes   10 minutes   Lala Lund K M.D on 09/25/2014 at 11:02 AM  Between 7am to 7pm - Pager - [phone]  After 7pm go to [email or web address] - password [email or web address]  And look for the night coverage person covering for me after hours  [facility] Office  [phone]    Subjective:   Jeffery Nelson today has, No headache, No chest pain, No abdominal pain - No Nausea, No new weakness tingling or numbness, No Cough - SOB.   Objective:   Filed Vitals:   09/25/14 0400 09/25/14 0503 09/25/14 0750 09/25/14 1027  BP:  149/69  147/75  Pulse:  83  84  Temp:  98.6  F (37 C)    TempSrc:  Oral    Resp: 15 17 16    Height:      Weight:      SpO2: 97%  98% 97%    Wt Readings from Last 3 Encounters:  09/21/14 122.471 kg (270 lb)  09/21/14 122.471 kg (270 lb)  10/04/13 138.665 kg (305 lb 11.2 oz)     Intake/Output Summary (Last 24 hours) at 09/25/14 1102 Last data filed at 09/25/14 1007  Gross per 24 hour  Intake   1020 ml  Output    900 ml  Net    120 ml    Exam Awake Alert, Oriented X 3, No new F.N deficits, Normal affect [place].AT,PERRAL Supple Neck,No JVD, No cervical lymphadenopathy appriciated.  Symmetrical Chest wall movement, Good air movement bilaterally, CTAB RRR,No Gallops,Rubs or new Murmurs, No Parasternal Heave +ve B.Sounds, Abd Soft, Non tender, No organomegaly appriciated, No rebound - guarding or rigidity. No Cyanosis, Clubbing or edema, No new Rash or bruise  Data Review

## 2014-09-25 NOTE — Progress Notes (Signed)
Physical Therapy Treatment Patient Details Name: Jeffery Nelson MRN: [id number] DOB: [birth year] Today's Date: 09/25/2014    History of Present Illness 68 y/o male with PMH of IDDM, HTN, HPL, lymphedema is admitted with mechanical fall and L hip fracture. s/p IM nail    PT Comments    Pt continues to demo difficulty with hop to step gt. Only able to take single hop step towards HOB. Agreeable to exercises at EOB today. Cont to recommend CIR for post acute rehab. If CIR is not approved, pt will require SNF for additional rehab prior to returning home. Pt c/o pain in Lt pectoralis muscle region, MD sticky note updated.   Follow Up Recommendations  CIR     Equipment Recommendations  3in1 (PT)    Recommendations for Other Services       Precautions / Restrictions Precautions Precautions: Fall Restrictions Weight Bearing Restrictions: Yes LLE Weight Bearing: Touchdown weight bearing    Mobility  Bed Mobility Overal bed mobility: Needs Assistance Bed Mobility: Supine to Sit;Sit to Supine     Supine to sit: Min assist;HOB elevated Sit to supine: Min assist   General bed mobility comments: (A) to manage Lt LE only today; cues for hand placement and sequencing   Transfers Overall transfer level: Needs assistance Equipment used: Rolling walker (2 wheeled) Transfers: Sit to/from Stand Sit to Stand: Min assist         General transfer comment: blocked practice of sit to stand for strengthening (see exercises); cues for hand placement and min (A) to achieve maintaining TDWB status with transfers well   Ambulation/Gait             General Gait Details: performed single hop step towards [facility] but unsteady to progress    Stairs            Wheelchair Mobility    Modified Rankin (Stroke Patients Only)       Balance Overall balance assessment: Needs assistance;History of Falls Sitting-balance support: Feet supported;No upper extremity supported Sitting  balance-Leahy Scale: Good Sitting balance - Comments: Sat EOB for exercises; denied any dizziness   Standing balance support: During functional activity;Bilateral upper extremity supported Standing balance-Leahy Scale: Poor Standing balance comment: relies on RW for balance and UE support                     Cognition Arousal/Alertness: Awake/alert Behavior During Therapy: WFL for tasks assessed/performed Overall Cognitive Status: Within Functional Limits for tasks assessed                      Exercises General Exercises - Lower Extremity Ankle Circles/Pumps: AROM;Both;10 reps;Supine Long Arc Quad: Strengthening;Seated;Both;10 reps Heel Slides: AROM;Strengthening;10 reps;Supine Other Exercises Other Exercises: performed sit to stand x 5; incr time to rest between due to fatigue     General Comments General comments (skin integrity, edema, etc.): reviewed D/C disposition for safety and importance of OOB for all meals       Pertinent Vitals/Pain Pain Assessment: 0-10 Pain Score: 3  Pain Location: Lt hip and Lt pectoralis muscle region Pain Descriptors / Indicators: Burning;Aching;Stabbing (stabbing pain in Lt pectoralis muscle region) Pain Intervention(s): Monitored during session;Limited activity within patient's tolerance;Premedicated before session;Repositioned    Home Living                      Prior Function            PT Goals (current goals can  now be found in the care plan section) Acute Rehab PT Goals Patient Stated Goal: to get stronger and sleep better PT Goal Formulation: With patient Time For Goal Achievement: 09/29/14 Potential to Achieve Goals: Good Progress towards PT goals: Progressing toward goals    Frequency  Min 4X/week    PT Plan Frequency needs to be updated    Co-evaluation             End of Session Equipment Utilized During Treatment: Gait belt Activity Tolerance: Patient limited by fatigue Patient left:  in bed;with call bell/phone within reach     Time: 1441-1456 PT Time Calculation (min): 15 min  Charges:  $Therapeutic Exercise: 8-22 mins                    G CodesGustavus Bryant, [place]  [phone] 09/25/2014, 3:48 PM

## 2014-09-25 NOTE — Progress Notes (Signed)
Occupational Therapy Treatment Patient Details Name: Jeffery Nelson MRN: [id number] DOB: 08-17-1946 Today's Date: 09/25/2014    History of present illness 68 y/o male with PMH of IDDM, HTN, HPL, lymphedema is admitted with mechanical fall and L hip fracture. s/p IM nail   OT comments  Pt seen today to work on functional transfers and endurance for ADL tasks in standing. Pt making progress, however continues to be limited due by weakness, decreased endurance, and pain. Pt agreeable to complete transfer to recliner to sit up for dinner. Pt will continue to benefit from acute OT and would be an excellent CIR candidate. If pt is not approved for CIR, pt will need SNF prior to returning home.    Follow Up Recommendations  CIR    Equipment Recommendations  3 in 1 bedside comode    Recommendations for Other Services      Precautions / Restrictions Precautions Precautions: Fall Restrictions Weight Bearing Restrictions: Yes LLE Weight Bearing: Touchdown weight bearing       Mobility Bed Mobility Overal bed mobility: Needs Assistance Bed Mobility: Supine to Sit     Supine to sit: Min assist;HOB elevated Sit to supine: Min assist   General bed mobility comments: (A) to manage Lt LE only today; cues for hand placement and sequencing   Transfers Overall transfer level: Needs assistance Equipment used: Rolling walker (2 wheeled) Transfers: Sit to/from [facility] Sit to Stand: Min guard Stand pivot transfers: Min assist       General transfer comment: VC's for hand placement. Pt required assist to manage RW during stand-pivot. VC's for controlled desent.     Balance Overall balance assessment: Needs assistance;History of Falls Sitting-balance support: Feet supported;No upper extremity supported Sitting balance-Leahy Scale: Good Sitting balance - Comments: Sat EOB for exercises; denied any dizziness   Standing balance support: During functional  activity;Bilateral upper extremity supported Standing balance-Leahy Scale: Poor Standing balance comment: relies on RW for balance and UE support                    ADL Overall ADL's : Needs assistance/impaired     Grooming: Standing;Minimal assistance                   Toilet Transfer: Stand-pivot;RW;Minimal assistance             General ADL Comments: Pt able to transfer to recliner for dinner using small hops. Pt continues to present with decreased strength and endurance.                 Cognition  Arousal/Alertness: Awake/Alert Behavior During Therapy: WFL for tasks assessed/performed Overall Cognitive Status: Within Functional Limits for tasks assessed                         Exercises General Exercises - Lower Extremity Ankle Circles/Pumps: AROM;Both;10 reps;Supine Long Arc Quad: Strengthening;Seated;Both;10 reps Heel Slides: AROM;Strengthening;10 reps;Supine Other Exercises Other Exercises: Encouraged pt to perform chair pushups throughout the day for increased tricep strength.            Pertinent Vitals/ Pain       Pain Assessment: No/denies pain          Frequency Min 2X/week     Progress Toward Goals  OT Goals(current goals can now be found in the care plan section)  Progress towards OT goals: Progressing toward goals  Acute Rehab OT Goals Patient Stated Goal: to go to CIR  and then home OT Goal Formulation: With patient Time For Goal Achievement: 09/30/14 Potential to Achieve Goals: Good ADL Goals Pt Will Perform Grooming: with min guard assist;standing Pt Will Perform Lower Body Dressing: with set-up;with supervision;with adaptive equipment;sit to/from stand Pt Will Transfer to Toilet: with min guard assist;ambulating;bedside commode Pt Will Perform Toileting - Clothing Manipulation and hygiene: with supervision;sit to/from stand Additional ADL Goal #1: Pt will be S for in and OOB for BADLs without use of rail and  HOB flat  Plan Discharge plan remains appropriate       End of Session Equipment Utilized During Treatment: Gait belt;Rolling walker   Activity Tolerance Patient tolerated treatment well   Patient Left in chair;with call bell/phone within reach   Nurse Communication          Time: 5284-1324 OT Time Calculation (min): 18 min  Charges: OT General Charges $OT Visit: 1 Procedure OT Treatments $Therapeutic Activity: 8-22 mins  Villa Herb M 09/25/2014, 5:14 PM   Cyndie Chime, OTR/L Occupational Therapist [phone] (pager)

## 2014-09-26 LAB — GLUCOSE, CAPILLARY
GLUCOSE-CAPILLARY: 164 mg/dL — AB (ref 70–99)
Glucose-Capillary: 121 mg/dL — ABNORMAL HIGH (ref 70–99)
Glucose-Capillary: 153 mg/dL — ABNORMAL HIGH (ref 70–99)
Glucose-Capillary: 156 mg/dL — ABNORMAL HIGH (ref 70–99)

## 2014-09-26 MED ORDER — POLYETHYLENE GLYCOL 3350 17 G PO PACK
17.0000 g | PACK | Freq: Two times a day (BID) | ORAL | Status: DC
  Administered 2014-09-26: 17 g via ORAL
  Filled 2014-09-26 (×5): qty 1

## 2014-09-26 MED ORDER — BISACODYL 10 MG RE SUPP
10.0000 mg | Freq: Every day | RECTAL | Status: DC
  Administered 2014-09-26: 10 mg via RECTAL
  Filled 2014-09-26: qty 1

## 2014-09-26 NOTE — Progress Notes (Signed)
[employer] has denied approval for admission to inpt rehab. I have alerted SW for the need for SNF rehab. [phone]

## 2014-09-26 NOTE — Clinical Social Work Note (Addendum)
CSW is still awaiting insurance decision for CIR. If declined pt can transition to [employer].    Addendum:  CSW updated by CIR that insurance denied pt. CSW contacted [employer] to start insurance authorization.    [place], MSW, [place]

## 2014-09-26 NOTE — Clinical Social Work Placement (Addendum)
Clinical Social Work Department CLINICAL SOCIAL WORK PLACEMENT NOTE 09/26/2014  Patient:  NORIEL, GUTHRIE  Account Number:  [id number] Admit date:  09/21/2014  Clinical Social Worker:  Greta Doom, LCSWA  Date/time:  09/26/2014 08:41 AM  Clinical Social Work is seeking post-discharge placement for this patient at the following level of care:   SKILLED NURSING   (*CSW will update this form in Epic as items are completed)   09/26/2014  Patient/family provided with [facility] Department of Clinical Social Work's list of facilities offering this level of care within the geographic area requested by the patient (or if unable, by the patient's family).  09/26/2014  Patient/family informed of their freedom to choose among providers that offer the needed level of care, that participate in Medicare, Medicaid or managed care program needed by the patient, have an available bed and are willing to accept the patient.  09/26/2014  Patient/family informed of [facility]' ownership interest in [facility], as well as of the fact that they are under no obligation to receive care at this facility.  PASARR submitted to EDS on 09/26/2014 PASARR number received on 09/26/2014  FL2 transmitted to all facilities in geographic area requested by pt/family on  09/26/2014 FL2 transmitted to all facilities within larger geographic area on 09/26/2014  Patient informed that his/her managed care company has contracts with or will negotiate with  certain facilities, including the following:     Patient/family informed of bed offers received: 09/26/2014   Patient chooses bed at [facility]  Physician recommends and patient chooses bed at    Patient to be transferred to [facility]  on  09/27/2014 Patient to be transferred to facility by [facility] Patient and family notified of transfer on 09/27/2014 Name of family member notified:  Pt reported he will contact his family   The following physician request were  entered in Epic:   Additional Comments:  [place], MSW, [place]

## 2014-09-26 NOTE — Discharge Summary (Addendum)
[facility]                                                                                Patient Demographics  Jeffery Nelson, is a 68 y.o. male  [id number]:[id number]  [id number]  DOB - 08-24-1946  Admit date - 09/21/2014  Admitting Physician Reyne Dumas, MD  Outpatient Primary MD for the patient is Stephens Shire, MD  LOS - 6   Chief Complaint  Patient presents with  . Fall        Assessment & Plan    Patient seen briefly today due for discharge soon per Discharge done by Dr Daleen Bo, no further issues, Vital signs stable, patient feels fine. Please see discharge summary done by previous M.D. for all details.    Medications  Scheduled Meds: . bisacodyl  10 mg Rectal Daily  . docusate sodium  100 mg Oral BID  . enoxaparin (LOVENOX) injection  40 mg Subcutaneous Q24H  . FLUoxetine  20 mg Oral Daily  . furosemide  20 mg Oral Daily  . insulin aspart  0-5 Units Subcutaneous QHS  . insulin aspart  0-9 Units Subcutaneous TID WC  . insulin aspart protamine- aspart  40 Units Subcutaneous BID WC  . pneumococcal 23 valent vaccine  0.5 mL Intramuscular Tomorrow-1000  . polyethylene glycol  17 g Oral BID  . quinapril  20 mg Oral Daily  . rOPINIRole  4 mg Oral Daily  . rosuvastatin  20 mg Oral q1800   Continuous Infusions:  PRN Meds:.acetaminophen, acetaminophen, calcium carbonate, HYDROmorphone (DILAUDID) injection, levalbuterol, menthol-cetylpyridinium, oxyCODONE, phenol, sodium chloride    Time Spent in minutes   10 minutes   Lala Lund K M.D on 09/27/2014 at 8:38 AM  Between 7am to 7pm - Pager - [phone]  After 7pm go to [email or web address] - password [email or web address]  And look for the night coverage person covering for me after hours  [facility] Office  [phone]    Subjective:   [name] today has, No headache, No chest pain, No abdominal pain - No Nausea, No new weakness tingling or numbness, No Cough - SOB.   Objective:    Filed Vitals:   09/26/14 2201 09/26/14 2358 09/27/14 0400 09/27/14 0601  BP: 158/76   141/74  Pulse: 84   101  Temp: 98.4 F (36.9 C)   98.1 F (36.7 C)  TempSrc: Oral   Oral  Resp: 18 16 16 16   Height:      Weight:      SpO2: 97% 96% 98% 94%    Wt Readings from Last 3 Encounters:  09/21/14 122.471 kg (270 lb)  09/21/14 122.471 kg (270 lb)  10/04/13 138.665 kg (305 lb 11.2 oz)     Intake/Output Summary (Last 24 hours) at 09/27/14 0838 Last data filed at 09/27/14 0826  Gross per 24 hour  Intake    960 ml  Output   1300 ml  Net   -340 ml    Exam Awake Alert, Oriented X 3, No new F.N deficits, Normal affect [place].AT,PERRAL Supple Neck,No JVD, No cervical lymphadenopathy appriciated.  Symmetrical Chest wall movement, Good air movement bilaterally, CTAB RRR,No Gallops,Rubs or new Murmurs, No Parasternal  Heave +ve B.Sounds, Abd Soft, Non tender, No organomegaly appriciated, No rebound - guarding or rigidity. No Cyanosis, Clubbing or edema, No new Rash or bruise    Data Reviewed

## 2014-09-26 NOTE — Progress Notes (Signed)
[facility]                                                                                Patient Demographics  Jeffery Aldrete, is a 68 y.o. male  [id number]  [id number]  DOB - 08/29/1946  Admit date - 09/21/2014  Admitting Physician Reyne Dumas, MD  Outpatient Primary MD for the patient is Stephens Shire, MD  LOS - 5   Chief Complaint  Patient presents with  . Fall        Assessment & Plan    Patient seen briefly today due for discharge soon per Discharge done by Dr Daleen Bo, no further issues, Vital signs stable, patient feels fine.      Medications  Scheduled Meds: . bisacodyl  10 mg Rectal Daily  . docusate sodium  100 mg Oral BID  . enoxaparin (LOVENOX) injection  40 mg Subcutaneous Q24H  . FLUoxetine  20 mg Oral Daily  . furosemide  20 mg Oral Daily  . insulin aspart  0-5 Units Subcutaneous QHS  . insulin aspart  0-9 Units Subcutaneous TID WC  . insulin aspart protamine- aspart  40 Units Subcutaneous BID WC  . pneumococcal 23 valent vaccine  0.5 mL Intramuscular Tomorrow-1000  . polyethylene glycol  17 g Oral BID  . quinapril  20 mg Oral Daily  . rOPINIRole  4 mg Oral Daily  . rosuvastatin  20 mg Oral q1800   Continuous Infusions:  PRN Meds:.acetaminophen, acetaminophen, calcium carbonate, HYDROmorphone (DILAUDID) injection, levalbuterol, menthol-cetylpyridinium, oxyCODONE, phenol, sodium chloride    Time Spent in minutes   10 minutes   Lala Lund K M.D on 09/26/2014 at 10:10 AM  Between 7am to 7pm - Pager - [phone]  After 7pm go to [email or web address] - password [email or web address]  And look for the night coverage person covering for me after hours  [facility] Office  [phone]    Subjective:   Kevon Tench today has, No headache, No chest pain, No abdominal pain - No Nausea, No new weakness tingling or numbness, No Cough - SOB.   Objective:   Filed Vitals:   09/25/14 2000 09/26/14 0000 09/26/14 0400  09/26/14 0439  BP:    145/69  Pulse:    86  Temp:    97.6 F (36.4 C)  TempSrc:      Resp: 14 16 16 16   Height:      Weight:      SpO2: 93% 94% 98% 94%    Wt Readings from Last 3 Encounters:  09/21/14 122.471 kg (270 lb)  09/21/14 122.471 kg (270 lb)  10/04/13 138.665 kg (305 lb 11.2 oz)     Intake/Output Summary (Last 24 hours) at 09/26/14 1010 Last data filed at 09/26/14 0900  Gross per 24 hour  Intake    600 ml  Output    300 ml  Net    300 ml    Exam Awake Alert, Oriented X 3, No new F.N deficits, Normal affect [place].AT,PERRAL Supple Neck,No JVD, No cervical lymphadenopathy appriciated.  Symmetrical Chest wall movement, Good air movement bilaterally, CTAB RRR,No Gallops,Rubs or new Murmurs, No Parasternal Heave +ve B.Sounds, Abd Soft, Non tender, No organomegaly appriciated,  No rebound - guarding or rigidity. No Cyanosis, Clubbing or edema, No new Rash or bruise    Data Reviewed

## 2014-09-27 LAB — GLUCOSE, CAPILLARY: Glucose-Capillary: 171 mg/dL — ABNORMAL HIGH (ref 70–99)

## 2014-09-27 NOTE — Progress Notes (Signed)
Subjective: 6 Days Post-Op Procedure(s) (LRB): left hip intertroch fracture (Left) Patient reports pain as mild.  Patient reports pain as mild. He has no new complaints today other than being frustrated that he hasn't been able to get to a rehab facility yet; he is working with CSW to choose a SNF.  He has done well with PT to this point. His pain is well controlled with PO pain medications. He denies any new HA, CP, SOB, fever, chills, N, V, calf swelling or pain.    Objective: Vital signs in last 24 hours: Temp:  [97.9 F (36.6 C)-98.4 F (36.9 C)] 98.1 F (36.7 C) (10/09 0601) Pulse Rate:  [81-101] 101 (10/09 0601) Resp:  [16-20] 16 (10/09 0601) BP: (129-158)/(67-76) 141/74 mmHg (10/09 0601) SpO2:  [94 %-100 %] 94 % (10/09 0601)  Intake/Output from previous day: 10/08 0701 - 10/09 0700 In: 720 [P.O.:720] Out: 1300 [Urine:1300] Intake/Output this shift:    No results found for this basename: HGB,  in the last 72 hours No results found for this basename: WBC, RBC, HCT, PLT,  in the last 72 hours No results found for this basename: NA, K, CL, CO2, BUN, CREATININE, GLUCOSE, CALCIUM,  in the last 72 hours No results found for this basename: LABPT, INR,  in the last 72 hours  Physical Exam: WD/WN caucasian male in nad. A and O x4. Mood and affect appropriate. EOMI. Respirations normal and unlabored. Abdomen soft and non-tender. Dressings to left lateral thigh C/D/I. NV intact with brisk capillary refill and 5/5 strength of toe and ankle extensors and flexors bilaterally. Distal sensation intact bilaterally. No calf swelling or palpable cords. No lymphadenopathy.    Assessment/Plan: 6 Days Post-Op Procedure(s) (LRB): left hip intertroch fracture (Left) -Up with PT; TTWB LLE  -Lovenox for DVT prophylaxis; Rx in chart for D/C  -PO pain medications for pain control; Rx in chart for D/C  -Continue hip precautions  -D/C to SNF per IM team    Christyana Corwin HOWELLS 09/27/2014, 7:28  AM [phone]

## 2014-09-27 NOTE — Care Management (Signed)
CARE MANAGEMENT NOTE 09/27/2014  Patient:  Jeffery Nelson, Jeffery Nelson   Account Number:  [id number]  Date Initiated:  09/27/2014  Documentation initiated by:  Ricki Miller  Subjective/Objective Assessment:   68 yr old male admitted with left hip fracture,underwent left hip IM Nailing.     Action/Plan:   Patient will need shortterm rehab at SNF, Patient [facility].   Anticipated DC Date:  09/27/2014   Anticipated DC Plan:  SKILLED NURSING FACILITY  In-house referral  Clinical Social Worker      DC Planning Services  CM consult      [facility] Choice  NA   Choice offered to / List presented to:  C-1 Patient   DME arranged  NA        [place] arranged  NA      Status of service:  Completed, signed off Medicare Important Message given?  YES (If response is "NO", the following Medicare IM given date fields will be blank) Date Medicare IM given:  09/26/2014 Medicare IM given by:  Ricki Miller Date Additional Medicare IM given:   Additional Medicare IM given by:    Discharge Disposition:  [place]  Per UR Regulation:  Reviewed for med. necessity/level of care/duration of stay  If discussed at [place] of Stay Meetings, dates discussed:    Comments:

## 2014-09-30 ENCOUNTER — Other Ambulatory Visit: Payer: Self-pay | Admitting: *Deleted

## 2014-09-30 MED ORDER — OXYCODONE HCL 5 MG PO TABS: ORAL_TABLET | ORAL | Status: DC

## 2014-09-30 NOTE — Telephone Encounter (Signed)
[facility] 

## 2014-10-04 ENCOUNTER — Non-Acute Institutional Stay (SKILLED_NURSING_FACILITY): Payer: Medicare Other | Admitting: Internal Medicine

## 2014-10-04 ENCOUNTER — Encounter: Payer: Self-pay | Admitting: Internal Medicine

## 2014-10-04 DIAGNOSIS — I10 Essential (primary) hypertension: Secondary | ICD-10-CM | POA: Insufficient documentation

## 2014-10-04 DIAGNOSIS — Z96649 Presence of unspecified artificial hip joint: Secondary | ICD-10-CM

## 2014-10-04 DIAGNOSIS — E7800 Pure hypercholesterolemia, unspecified: Secondary | ICD-10-CM

## 2014-10-04 DIAGNOSIS — Z9660 Presence of unspecified orthopedic joint implant: Secondary | ICD-10-CM

## 2014-10-04 DIAGNOSIS — G2581 Restless legs syndrome: Secondary | ICD-10-CM

## 2014-10-04 DIAGNOSIS — E118 Type 2 diabetes mellitus with unspecified complications: Secondary | ICD-10-CM

## 2014-10-04 DIAGNOSIS — S72142D Displaced intertrochanteric fracture of left femur, subsequent encounter for closed fracture with routine healing: Secondary | ICD-10-CM

## 2014-10-04 DIAGNOSIS — S72143A Displaced intertrochanteric fracture of unspecified femur, initial encounter for closed fracture: Secondary | ICD-10-CM

## 2014-10-04 DIAGNOSIS — E1142 Type 2 diabetes mellitus with diabetic polyneuropathy: Secondary | ICD-10-CM | POA: Insufficient documentation

## 2014-10-04 HISTORY — DX: Displaced intertrochanteric fracture of unspecified femur, initial encounter for closed fracture: S72.143A

## 2014-10-04 HISTORY — DX: Presence of unspecified artificial hip joint: Z96.649

## 2014-10-04 NOTE — Progress Notes (Signed)
MRN: [id number] Name: Jeffery Nelson  Sex: male Age: 68 y.o. DOB: 1946/04/30  [place] #: [facility] Facility/Room:104 Level Of Care: SNF Provider: Inocencio Homes D Emergency Contacts: Extended Emergency Contact Information Primary Emergency Contact: Lavonda Jumbo [place] Phone: [phone] Relation: Sister Secondary Emergency Contact: Toyah of [place] Mobile Phone: [phone] Relation: Son  Code Status: FULL  Allergies: Lipitor and Lyrica  Chief Complaint  Patient presents with  . New Admit To SNF    HPI: Patient is 68 y.o. male who is admitted to SNF for OT/PT after Hip arthroscopy.  Past Medical History  Diagnosis Date  . Shortness of breath   . Dyspnea on exertion   . Lower extremity edema   . Benign neoplasm of colon   . Chest pain     From rib fractures  . Polyneuropathy in diabetes(357.2)   . Psychosexual dysfunction with inhibited sexual excitement   . Restless legs syndrome (RLS)   . Sleep arousal disorder   . Unspecified venous (peripheral) insufficiency   . Type II or unspecified type diabetes mellitus with unspecified complication, not stated as uncontrolled   . Pure hypercholesterolemia   . Hypertension     Past Surgical History  Procedure Laterality Date  . Lower extremity venous doppler  09/04/2012    Mild valvular insufficiency in the R Common Femoral vein w/ 1.6 sec of duration of refliux  . Transthoracic echocardiogram  09/04/2012    EF >55%, normal  . Intramedullary (im) nail intertrochanteric Left 09/21/2014    Procedure: left hip intertroch fracture;  Surgeon: [name], MD;  Location: [facility];  Service: Orthopedics;  Laterality: Left;      Medication List       This list is accurate as of: 10/04/14 11:59 PM.  Always use your most recent med list.               colchicine 0.6 MG tablet  Take 0.6 mg by mouth as needed (for gout).     enoxaparin 40 MG/0.4ML injection  Commonly known as:   LOVENOX  Inject 0.4 mLs (40 mg total) into the skin daily.     fluocinonide cream 0.05 %  Commonly known as:  LIDEX  Apply 1 application topically as needed (rash).     FLUoxetine 20 MG tablet  Commonly known as:  PROZAC  Take 20 mg by mouth daily.     furosemide 20 MG tablet  Commonly known as:  LASIX  Take 20 mg by mouth daily.     insulin aspart 100 UNIT/ML injection  Commonly known as:  novoLOG  Inject 0-9 Units into the skin 3 (three) times daily with meals.     insulin aspart protamine- aspart (70-30) 100 UNIT/ML injection  Commonly known as:  NOVOLOG MIX 70/30  Inject 0.4 mLs (40 Units total) into the skin 2 (two) times daily with a meal.     levalbuterol 0.63 MG/3ML nebulizer solution  Commonly known as:  XOPENEX  Take 3 mLs (0.63 mg total) by nebulization every 6 (six) hours as needed for wheezing or shortness of breath.     multivitamin with minerals Tabs tablet  Take 1 tablet by mouth daily.     OVER THE COUNTER MEDICATION  Take 1 tablet by mouth daily. Calcium 333 mg with Magnesium 133 mg and Zinc 5 mg tablets     oxyCODONE 5 MG immediate release tablet  Commonly known as:  Oxy IR/ROXICODONE  Take one tablet  by mouth every four hours as needed for mild pain; Take two tablets by mouth every four hours as needed for moderate to severe pain     potassium gluconate 595 MG Tabs tablet  Take 595 mg by mouth daily.     quinapril 20 MG tablet  Commonly known as:  ACCUPRIL  Take 20 mg by mouth daily.     rOPINIRole 4 MG tablet  Commonly known as:  REQUIP  Take 4 mg by mouth daily.     rosuvastatin 20 MG tablet  Commonly known as:  CRESTOR  Take 20 mg by mouth daily.        No orders of the defined types were placed in this encounter.    Immunization History  Administered Date(s) Administered  . Influenza,inj,Quad PF,36+ Mos 09/23/2014    History  Substance Use Topics  . Smoking status: Never Smoker   . Smokeless tobacco: Never Used  . Alcohol Use:  No    Family history is noncontributory    Review of Systems  DATA OBTAINED: from patient GENERAL:  no fevers, fatigue, appetite changes SKIN: No itching, rash EYES: No eye pain, redness, discharge EARS: No earache, tinnitus, change in hearing NOSE: No congestion, drainage or bleeding  MOUTH/THROAT: No mouth or tooth pain, No sore throat RESPIRATORY: No cough, wheezing, SOB CARDIAC: No chest pain, palpitations, lower extremity edema  GI: No abdominal pain, No N/V/D or constipation, No heartburn or reflux  GU: No dysuria, frequency or urgency, or incontinence  MUSCULOSKELETAL: would like not to have to ask for painmeds NEUROLOGIC: No headache, dizziness or focal weakness PSYCHIATRIC: No overt anxiety or sadness, No behavior issue.   Filed Vitals:   10/04/14 1703  BP: 150/84  Pulse: 98  Temp: 98 F (36.7 C)  Resp: 20    Physical Exam  GENERAL APPEARANCE: Alert, conversant,  No acute distress.  SKIN: No diaphoresis rash HEAD: Normocephalic, atraumatic  EYES: Conjunctiva/lids clear. Pupils round, reactive. EOMs intact.  EARS: External exam WNL, canals clear. Hearing grossly normal.  NOSE: No deformity or discharge.  MOUTH/THROAT: Lips w/o lesions  RESPIRATORY: Breathing is even, unlabored. Lung sounds are clear   CARDIOVASCULAR: Heart RRR no murmurs, rubs or gallops. trace peripheral edema.   GASTROINTESTINAL: Abdomen is soft, non-tender, not distended w/ normal bowel sounds. GENITOURINARY: Bladder non tender, not distended  MUSCULOSKELETAL: No abnormal joints or musculature NEUROLOGIC:  Cranial nerves 2-12 grossly intact. Moves all extremities  PSYCHIATRIC: Mood and affect appropriate to situation, no behavioral issues  Patient Active Problem List   Diagnosis Date Noted  . Intertrochanteric fx-closed 10/04/2014  . S/P total hip arthroplasty 10/04/2014  . Diabetes mellitus type 2, controlled, with complications   . Pure hypercholesterolemia   . Hypertension   .  Polyneuropathy in diabetes(357.2)   . Restless legs syndrome (RLS)   . Hip fracture 09/21/2014  . Morbid obesity 10/04/2013  . History of shingles 10/04/2013  . Varicose veins of lower extremities with other complications 86/76/1950  . Needs sleep apnea assessment 10/04/2013    CBC    Component Value Date/Time   WBC 6.4 09/24/2014 0545   RBC 3.42* 09/24/2014 0545   HGB 9.9* 09/24/2014 0545   HCT 28.7* 09/24/2014 0545   PLT 137* 09/24/2014 0545   MCV 83.9 09/24/2014 0545   LYMPHSABS 0.8 09/21/2014 0957   MONOABS 0.9 09/21/2014 0957   EOSABS 0.0 09/21/2014 0957   BASOSABS 0.1 09/21/2014 0957    CMP     Component Value Date/Time  NA 135* 09/23/2014 0540   K 3.8 09/23/2014 0540   CL 97 09/23/2014 0540   CO2 26 09/23/2014 0540   GLUCOSE 190* 09/23/2014 0540   BUN 17 09/23/2014 0540   CREATININE 0.61 09/23/2014 0540   CALCIUM 8.1* 09/23/2014 0540   PROT 6.7 09/21/2014 1128   ALBUMIN 3.8 09/21/2014 1128   AST 21 09/21/2014 1128   ALT 18 09/21/2014 1128   ALKPHOS 107 09/21/2014 1128   BILITOT 0.8 09/21/2014 1128   GFRNONAA >90 09/23/2014 0540   GFRAA >90 09/23/2014 0540    Assessment and Plan  Diabetes mellitus type 2, controlled, with complications Pt reports taking 60 Units insulin (70/30) TID but decreased due to mild hypoglycemia  -HA1c-7.0; cont mealtime insulin decreased to 40 to BID,+ISS; monitor Pt on ACEW and statin   Hypertension Cont accupril and lasix  Morbid obesity Diet  Pure hypercholesterolemia Continue crestor 20 mg  Restless legs syndrome (RLS) Daily requio    Hennie Duos, MD

## 2014-10-06 ENCOUNTER — Encounter: Payer: Self-pay | Admitting: Internal Medicine

## 2014-10-06 NOTE — Assessment & Plan Note (Signed)
Diet 

## 2014-10-06 NOTE — Assessment & Plan Note (Signed)
Cont accupril and lasix

## 2014-10-06 NOTE — Assessment & Plan Note (Signed)
Daily requio

## 2014-10-06 NOTE — Assessment & Plan Note (Signed)
Pt reports taking 60 Units insulin (70/30) TID but decreased due to mild hypoglycemia  -HA1c-7.0; cont mealtime insulin decreased to 40 to BID,+ISS; monitor Pt on ACEW and statin

## 2014-10-06 NOTE — Assessment & Plan Note (Signed)
Will schedule oxycodone for 2 weeks, lovenox as prophylaxis

## 2014-10-06 NOTE — Assessment & Plan Note (Signed)
Continue crestor 20 mg 

## 2014-10-06 NOTE — Assessment & Plan Note (Signed)
S/p L hip arthroplasty

## 2014-10-07 ENCOUNTER — Other Ambulatory Visit: Payer: Self-pay | Admitting: *Deleted

## 2014-10-07 MED ORDER — OXYCODONE HCL 5 MG PO TABS: ORAL_TABLET | ORAL | Status: DC

## 2014-10-07 NOTE — Telephone Encounter (Signed)
[facility] 

## 2014-10-08 ENCOUNTER — Non-Acute Institutional Stay (SKILLED_NURSING_FACILITY): Payer: Medicare Other | Admitting: Internal Medicine

## 2014-10-08 DIAGNOSIS — R609 Edema, unspecified: Secondary | ICD-10-CM

## 2014-10-08 DIAGNOSIS — I10 Essential (primary) hypertension: Secondary | ICD-10-CM

## 2014-10-08 HISTORY — DX: Edema, unspecified: R60.9

## 2014-10-08 NOTE — Progress Notes (Signed)
Patient ID: Jeffery Nelson, male   DOB: 1946-11-02, 68 y.o.   MRN: [id number]     this is an acute visit.  Level of care skilled.  Facility AF.   Chief Complaint   Acute visit secondary to concerns about diuretics-followup pain management   .     HPI: Patient is 68 y.o. male who is admitted to SNF for OT/PT after Hip arthroscopy--he appears to be doing well but says he is having significant pain apparently he was seen by Dr. Sheppard Coil late last week and had been on oxycodone 5 mg every 4 hoursrtn she also added when necessary oxycodone5 mg every 4 hours as needed.--He apparently has been taking this routinely in-I will write an order to get this routinely since he appears to be tolerating it and it is helping with his pain--obviously this will have to be held for any concerns of sedation but certainly does not exhibit today.  Patient also states he's been on Lasix as needed-apparently he was discharged on this routinely although somewhat unclear this may have been more of a transition from hospital to nursing home communication issue.  It appears she is on this for a history of edema I do not see specifically a diagnosis of congestive heart failure there is no 2-D echo available in the medical records that I was able to assess  earlier-this month-his chest x-ray did not show any cardiopulmonary acute issues it did show probable atelectasis  He states he's on Lasix only when necessary secondary to a history of gout with the diuretic aggravating his gout.  He does not complaining of  shortness of breath or increased edema from baseline-he does have support hose and he says this helps with his edema.    Past Medical History   Diagnosis  Date   .  Shortness of breath    .  Dyspnea on exertion    .  Lower extremity edema    .  Benign neoplasm of colon    .  Chest pain      From rib fractures   .  Polyneuropathy in diabetes(357.2)    .  Psychosexual dysfunction with inhibited sexual  excitement    .  Restless legs syndrome (RLS)    .  Sleep arousal disorder    .  Unspecified venous (peripheral) insufficiency    .  Type II or unspecified type diabetes mellitus with unspecified complication, not stated as uncontrolled    .  Pure hypercholesterolemia    .  Hypertension     Past Surgical History   Procedure  Laterality  Date   .  Lower extremity venous doppler   09/04/2012     Mild valvular insufficiency in the R Common Femoral vein w/ 1.6 sec of duration of refliux   .  Transthoracic echocardiogram   09/04/2012     EF >55%, normal   .  Intramedullary (im) nail intertrochanteric  Left  09/21/2014     Procedure: left hip intertroch fracture; Surgeon: [name], MD; Location: [facility]; Service: Orthopedics; Laterality: Left;      Medication List                     colchicine 0.6 MG tablet    Take 0.6 mg by mouth as needed (for gout).    enoxaparin 40 MG/0.4ML injection    Commonly known as: LOVENOX    Inject 0.4 mLs (40 mg total) into the skin daily.  fluocinonide cream 0.05 %    Commonly known as: LIDEX    Apply 1 application topically as needed (rash).    FLUoxetine 20 MG tablet    Commonly known as: PROZAC    Take 20 mg by mouth daily.    furosemide 20 MG tablet    Commonly known as: LASIX    Take 20 mg by mouth daily.    insulin aspart 100 UNIT/ML injection    Commonly known as: novoLOG    Inject 0-9 Units into the skin 3 (three) times daily with meals.    insulin aspart protamine- aspart (70-30) 100 UNIT/ML injection    Commonly known as: NOVOLOG MIX 70/30    Inject 0.4 mLs (40 Units total) into the skin 2 (two) times daily with a meal.    levalbuterol 0.63 MG/3ML nebulizer solution    Commonly known as: XOPENEX    Take 3 mLs (0.63 mg total) by nebulization every 6 (six) hours as needed for wheezing or shortness of breath.    multivitamin with minerals Tabs tablet    Take 1 tablet by mouth daily.    OVER THE COUNTER MEDICATION    Take 1 tablet by  mouth daily. Calcium 333 mg with Magnesium 133 mg and Zinc 5 mg tablets    oxyCODONE 5 MG immediate release tablet    Commonly known as: Oxy IR/ROXICODONE    Take one tablet by mouth every four hours as needed for mild pain; Take two tablets by mouth every four hours as needed for moderate to severe pain    potassium gluconate 595 MG Tabs tablet    Take 595 mg by mouth daily.    quinapril 20 MG tablet    Commonly known as: ACCUPRIL    Take 20 mg by mouth daily.    rOPINIRole 4 MG tablet    Commonly known as: REQUIP    Take 4 mg by mouth daily.    rosuvastatin 20 MG tablet    Commonly known as: CRESTOR    Take 20 mg by mouth daily.     No orders of the defined types were placed in this encounter.  Immunization History   Administered  Date(s) Administered   .  Influenza,inj,Quad PF,36+ Mos  09/23/2014    History   Substance Use Topics   .  Smoking status:  Never Smoker   .  Smokeless tobacco:  Never Used   .  Alcohol Use:  No   Family history is noncontributory  Review of Systems  DATA OBTAINED: from patient  GENERAL: no fevers, fatigue, appetite changes  SKIN: No itching, rash  EYES: No eye pain, redness, discharge  EARS: No earache, tinnitus, change in hearing  NOSE: No congestion, drainage or bleeding  MOUTH/THROAT: No mouth or tooth pain, No sore throat  RESPIRATORY: No cough, wheezing, SOB  CARDIAC: No chest pain, palpitations,has baseline lower extremity edema  GI: No abdominal pain, No N/V/D or constipation, No heartburn or reflux  GU: No dysuria, frequency or urgency, or incontinence  MUSCULOSKELETAL: Says the higher dose pain medication helps -- would like this routinely so he does not have to ask for it all the time  NEUROLOGIC: No headache, dizziness or focal weakness  PSYCHIATRIC: No overt anxiety or sadness, No behavior issue.                    Physical Exam  Temperature 96.6 pulse 85 respirations 20 blood pressure 111/68 GENERAL APPEARANCE: Alert,  conversant,  No acute distress.  SKIN: No diaphoresis rash  HEAD: Normocephalic, atraumatic  EYES: Conjunctiva/lids clear. Pupils round, reactive. EOMs intact.     NOSE: No deformity or discharge.  MOUTH/THROAT: Lips w/o lesions  RESPIRATORY: Breathing is even, unlabored. Lung sounds are clear  CARDIOVASCULAR: Heart RRR no murmurs, rubs or gallops. Trace-1 plus peripheral edema.  GASTROINTESTINAL: Abdomen is soft, non-tender, not distended w/ normal bowel sounds.  MUSCULOSKELETAL: No abnormal joints or musculature  NEUROLOGIC: Cranial nerves 2-12 grossly intact. Moves all extremities  PSYCHIATRIC: Mood and affect appropriate to situation, no behavioral issues  Patient Active Problem List    Diagnosis  Date Noted   .  Intertrochanteric fx-closed  10/04/2014   .  S/P total hip arthroplasty  10/04/2014   .  Diabetes mellitus type 2, controlled, with complications    .  Pure hypercholesterolemia    .  Hypertension    .  Polyneuropathy in diabetes(357.2)    .  Restless legs syndrome (RLS)    .  Hip fracture  09/21/2014   .  Morbid obesity  10/04/2013   .  History of shingles  10/04/2013   .  Varicose veins of lower extremities with other complications  54/27/0623   .  Needs sleep apnea assessment  10/04/2013   CBC    Component  Value  Date/Time    WBC  6.4  09/24/2014 0545    RBC  3.42*  09/24/2014 0545    HGB  9.9*  09/24/2014 0545    HCT  28.7*  09/24/2014 0545    PLT  137*  09/24/2014 0545    MCV  83.9  09/24/2014 0545    LYMPHSABS  0.8  09/21/2014 0957    MONOABS  0.9  09/21/2014 0957    EOSABS  0.0  09/21/2014 0957    BASOSABS  0.1  09/21/2014 0957   CMP    Component  Value  Date/Time    NA  135*  09/23/2014 0540    K  3.8  09/23/2014 0540    CL  97  09/23/2014 0540    CO2  26  09/23/2014 0540    GLUCOSE  190*  09/23/2014 0540    BUN  17  09/23/2014 0540    CREATININE  0.61  09/23/2014 0540    CALCIUM  8.1*  09/23/2014 0540    PROT  6.7  09/21/2014 1128    ALBUMIN  3.8  09/21/2014  1128    AST  21  09/21/2014 1128    ALT  18  09/21/2014 1128    ALKPHOS  107  09/21/2014 1128    BILITOT  0.8  09/21/2014 1128    GFRNONAA  >90  09/23/2014 0540    GFRAA  >90  09/23/2014 0540   Assessment and Plan   Edema-appears relatively baseline per patient he feels support hose would help which apparently he is obtaining-would like Lasix made when necessary-since it does aggravate his tendency to have gout flareups.  Will have to monitor his weights carefully Will order weight 3 times a week notify provider at gain greater than 3 pounds-.  Also he is on potassium we'll make this when necessary go with the Lasix-Will update a metabolic panel tomorrow-also will update one in approximately 10 days to ensure stability.  .  History of hip repair-he appears to be doing well with this however he has pain since he is taking the larger dose Oxycodone  essentially routinely will write an order so  he does not have to ask for it-- Appears to be tolerating this well - Diabetes mellitus type 2, controlled, with complications  Pt reports taking 60 Units insulin (70/30) TID but decreased due to mild hypoglycemia  -HA1c-7.0; cont mealtime insulin decreased to 40 to BID,+ISS; monitor  Pt on ACEW and statin  Hypertension  Cont accupril  --this appears stable recent blood pressure is 111/68-110/57-123/71-per patient request will make Lasix when necessary but we'll have to monitor blood pressure closely at this point appears stable- Morbid obesity  Diet  Pure hypercholesterolemia  Continue crestor 20 mg  Restless legs syndrome (RLS)  Daily Requip  History of gout-at this point appears stable but he is concerned about diuretic aggravating this as noted above he is on colchicine as needed once a day  [id number]-of note greater than 25 minutes spent assessing patient-discussing his concerns-reviewing his medical history-and coordinating and formulating a plan of care for numerous diagnoses

## 2014-10-21 ENCOUNTER — Encounter: Payer: Self-pay | Admitting: Internal Medicine

## 2014-10-21 ENCOUNTER — Non-Acute Institutional Stay (SKILLED_NURSING_FACILITY): Payer: Medicare Other | Admitting: Internal Medicine

## 2014-10-21 DIAGNOSIS — S72002D Fracture of unspecified part of neck of left femur, subsequent encounter for closed fracture with routine healing: Secondary | ICD-10-CM

## 2014-10-21 DIAGNOSIS — E118 Type 2 diabetes mellitus with unspecified complications: Secondary | ICD-10-CM

## 2014-10-21 DIAGNOSIS — I10 Essential (primary) hypertension: Secondary | ICD-10-CM

## 2014-10-21 DIAGNOSIS — G2581 Restless legs syndrome: Secondary | ICD-10-CM

## 2014-10-21 DIAGNOSIS — S72141D Displaced intertrochanteric fracture of right femur, subsequent encounter for closed fracture with routine healing: Secondary | ICD-10-CM

## 2014-10-21 DIAGNOSIS — R609 Edema, unspecified: Secondary | ICD-10-CM

## 2014-10-21 NOTE — Progress Notes (Signed)
Patient ID: Jeffery Nelson, male   DOB: August 16, 1946, 68 y.o.   MRN: [id number]                                                                                                                                                                                                                                                                                                                    Progress Notes                                            Diet                          Therapy Notes             Morbid obesity - Hennie Duos, MD at 10/06/2014 6:51 PM     Status: Written Related Problem: Morbid obesity   Expand All Collapse All   Diet            Pure hypercholesterolemia - Hennie Duos, MD at 10/06/2014 6:51 PM     Status: Written Related Problem: Pure hypercholesterolemia   Expand All Collapse All   Continue crestor 20 mg            Restless legs syndrome (RLS) - Hennie Duos, MD at 10/06/2014 6:52 PM     Status: Written Related Problem: Restless legs syndrome (RLS)   Expand All Collapse All   Daily requio            S/P total hip arthroplasty - Hennie Duos, MD at 10/06/2014 6:58 PM     Status: Written Related Problem: S/P total hip arthroplasty   Expand All Collapse All   Will schedule oxycodone for 2 weeks, lovenox as prophylaxis            Intertrochanteric fx-closed - Hennie Duos, MD at  10/06/2014 6:58 PM     Status: Written Related Problem: Intertrochanteric fx-closed   Expand All Collapse All   S/p L hip arthroplasty             Not recorded        Other Encounter Related Information     Allergies & Medications    Problem List    History    Patient-Entered Questionnaires      Level of  Service     PR INITIAL NURSING FACILITY CARE/DAY 26 MINUTES [99306]         All Flowsheet Templates (all recorded)     Encounter [place] Patient Info      All Charges for This Encounter     Code Description Service Date Service Provider Modifiers Qty    [phone] PR INITIAL NURSING FACILITY CARE/DAY 45 MINUTES 10/04/2014 Hennie Duos, MD  1                                  No data filed        No data filed        No data filed                                                                   This encounter was created in error - please disregard.

## 2014-10-21 NOTE — Progress Notes (Signed)
Patient ID: Jeffery Nelson, male   DOB: 07/12/1946, 68 y.o.   MRN: [id number]  this is a discharge note.   Level of care skilled.  [place].   Chief Complaint   Discharge note   .    HPI: Patient is 68 y.o. male who is admitted to SNF for OT/PT after Hip arthroscopy--he appears to be doing well and will be going home-he will need continued PT and OT and will need a wheelchair for ambulation   I recently saw him about his concerns about Lasix-he said he had been on this when necessary but apparently had been discharged from the hospital on it routinely although this may of been more of a communication issue during the discharge transition-Lasix has been made when necessary  He states he's on Lasix only when necessary secondary to a history of gout with the diuretic aggravating his gout.  He has been cleared for partial weightbearing right orthopedics-he appears to be ambulating fairly well with a walker but uses his wheelchair quite a bit yet and will need this for ambulation.  He has no complaints today he does take his Lasix occasionally but not very often.  I do note he still on Loveno xfor DVT prophylaxis will have nursing staff contact orthopedics in the morning before discharge to see if he still needs to be on this since he is ambulating   He does not complaining of shortness of breath or increased edema from baseline-he does have support hose and he says this helps with his edema  He will be going home he will have someone staying with him for an indefinite time  .   Past Medical History   Diagnosis  Date   .  Shortness of breath    .  Dyspnea on exertion    .  Lower extremity edema    .  Benign neoplasm of colon    .  Chest pain      From rib fractures   .  Polyneuropathy in diabetes(357.2)    .  Psychosexual dysfunction with inhibited sexual excitement    .  Restless legs syndrome (RLS)    .  Sleep arousal  disorder    .  Unspecified venous (peripheral) insufficiency    .  Type II or unspecified type diabetes mellitus with unspecified complication, not stated as uncontrolled    .  Pure hypercholesterolemia    .  Hypertension     Past Surgical History   Procedure  Laterality  Date   .  Lower extremity venous doppler   09/04/2012     Mild valvular insufficiency in the R Common Femoral vein w/ 1.6 sec of duration of refliux   .  Transthoracic echocardiogram   09/04/2012     EF >55%, normal   .  Intramedullary (im) nail intertrochanteric  Left  09/21/2014     Procedure: left hip intertroch fracture; Surgeon: [name], MD; Location: [facility]; Service: Orthopedics; Laterality: Left;      Medication List                     colchicine 0.6 MG tablet    Take 0.6 mg by mouth as needed (for gout).    enoxaparin 40 MG/0.4ML injection    Commonly known as: LOVENOX    Inject 0.4 mLs (40 mg total) into the skin daily.    fluocinonide cream 0.05 %    Commonly known as: LIDEX  Apply 1 application topically as needed (rash).    FLUoxetine 20 MG tablet    Commonly known as: PROZAC    Take 20 mg by mouth daily.    furosemide 20 MG tablet    Commonly known as: LASIX    Take 20 mg by mouth dailyas nneeded.    insulin aspart 100 UNIT/ML injection    Commonly known as: novoLOG    Inject 0-9 Units into the skin 3 (three) times daily with meals.    insulin aspart protamine- aspart (70-30) 100 UNIT/ML injection    Commonly known as: NOVOLOG MIX 70/30    Inject 0.4 mLs (40 Units total) into the skin 2 (two) times daily with a meal.    levalbuterol 0.63 MG/3ML nebulizer solution    Commonly known as: XOPENEX    Take 3 mLs (0.63 mg total) by nebulization every 6 (six) hours as needed for wheezing or shortness of breath.    multivitamin with minerals Tabs tablet    Take 1 tablet by mouth daily.    OVER THE COUNTER  MEDICATION    Take 1 tablet by mouth daily. Calcium 333 mg with Magnesium 133 mg and Zinc 5 mg tablets    oxyCODONE 5 MG immediate release tablet    Commonly known as: Oxy IR/ROXICODONE    Take one tablet by mouth every four hours as needed for mild pain; Take two tablets by mouth every four hours as needed for moderate to severe pain    potassium gluconate 595 MG Tabs tablet    Take 595 mg by mouth daily.    quinapril 20 MG tablet    Commonly known as: ACCUPRIL    Take 20 mg by mouth daily.    rOPINIRole 4 MG tablet    Commonly known as: REQUIP    Take 4 mg by mouth daily.    rosuvastatin 20 MG tablet    Commonly known as: CRESTOR    Take 20 mg by mouth daily.     No orders of the defined types were placed in this encounter.  Immunization History   Administered  Date(s) Administered   .  Influenza,inj,Quad PF,36+ Mos  09/23/2014    History   Substance Use Topics   .  Smoking status:  Never Smoker   .  Smokeless tobacco:  Never Used   .  Alcohol Use:  No   Family history is noncontributory  Review of Systems  DATA OBTAINED: from patient  GENERAL: no fevers, fatigue, appetite changes  SKIN: No itching, rash  EYES: No eye pain, redness, discharge  EARS: No earache, tinnitus, change in hearing  NOSE: No congestion, drainage or bleeding  MOUTH/THROAT: No mouth or tooth pain, No sore throat  RESPIRATORY: No cough, wheezing, SOB  CARDIAC: No chest pain, palpitations,has baseline lower extremity edema  GI: No abdominal pain, No N/V/D or constipation, No heartburn or reflux  GU: No dysuria, frequency or urgency, or incontinence  MUSCULOSKELETAL: Says discomfort is controlled on the oxycodone NEUROLOGIC: No headache, dizziness or focal weakness  PSYCHIATRIC: No overt anxiety or sadness, No behavior issue.                    Physical Exam temperature 98.3 pulse 86 respirations 18 blood pressure  130/81 GENERAL APPEARANCE: Alert, conversant, No acute distress.  SKIN: No diaphoresis rash --surgical site left hip appears to be well-healed with baseline scarring--no sign of infection--- HEAD: Normocephalic, atraumatic  EYES: Conjunctiva/lids clear. Pupils round, reactive.  EOMs intact.    NOSE: No deformity or discharge.  MOUTH/THROAT: Lips w/o lesions  throat clear  RESPIRATORY: Breathing is even, unlabored. Lung sounds are clear  CARDIOVASCULAR: Heart RRR no murmurs, rubs or gallops. Trace-1 plus peripheral edema.  GASTROINTESTINAL: Abdomen is  obese soft, non-tender, not distended w/ normal bowel sounds.  MUSCULOSKELETAL: No abnormal joints or musculature--is able to stand up without assistance and use walker although somewhat weak does use a wheelchair as well  NEUROLOGIC: Cranial nerves 2-12 grossly intact. Moves all extremities  PSYCHIATRIC: Mood and affect appropriate to situation, no behavioral issues  Patient Active Problem List    Diagnosis  Date Noted   .  Intertrochanteric fx-closed  10/04/2014   .  S/P total hip arthroplasty  10/04/2014   .  Diabetes mellitus type 2, controlled, with complications    .  Pure hypercholesterolemia    .  Hypertension    .  Polyneuropathy in diabetes(357.2)    .  Restless legs syndrome (RLS)    .  Hip fracture  09/21/2014   .  Morbid obesity  10/04/2013   .  History of shingles  10/04/2013   .  Varicose veins of lower extremities with other complications  74/07/1447   .  Needs sleep apnea assessment  10/04/2013     Labs.  10/09/2014.  WBC 6.9 hemoglobin 12.0 platelets 216.  Sodium 136 potassium 5 BUN 17 creatinine 0.7.      Component  Value  Date/Time    WBC  6.4  09/24/2014 0545    RBC  3.42*  09/24/2014 0545    HGB  9.9*  09/24/2014 0545    HCT  28.7*  09/24/2014 0545    PLT  137*  09/24/2014 0545    MCV  83.9  09/24/2014 0545    LYMPHSABS  0.8   09/21/2014 0957    MONOABS  0.9  09/21/2014 0957    EOSABS  0.0  09/21/2014 0957    BASOSABS  0.1  09/21/2014 0957   CMP    Component  Value  Date/Time    NA  135*  09/23/2014 0540    K  3.8  09/23/2014 0540    CL  97  09/23/2014 0540    CO2  26  09/23/2014 0540    GLUCOSE  190*  09/23/2014 0540    BUN  17  09/23/2014 0540    CREATININE  0.61  09/23/2014 0540    CALCIUM  8.1*  09/23/2014 0540    PROT  6.7  09/21/2014 1128    ALBUMIN  3.8  09/21/2014 1128    AST  21  09/21/2014 1128    ALT  18  09/21/2014 1128    ALKPHOS  107  09/21/2014 1128    BILITOT  0.8  09/21/2014 1128    GFRNONAA  >90  09/23/2014 0540    GFRAA  >90  09/23/2014 0540   Assessment and Plan   Edema-appears relatively baseline--takes his Lasix occasionally-will update metabolic panel before patient leaves facility to ensure stabilityof electrolytes-    .  [name]  History of hip repair-he appears to be doing well with this--orders per orthopedics partial weightbearing-with return to clinic in approximately one month-he will need continued PT and OT--he is receiving oxycodone for pain apparently with good relief In regards to the Lovenox-will have nursing staff contact orthopedics in the morning to see if he remain on this or possibly use another anticoagulant possibly aspirin --  - Diabetes  mellitus type 2, controlled, with complications  Pt reports taking 60 Units insulin (70/30) TID but decreased due to mild hypoglycemia  -HA1c-7.0; cont mealtime insulin decreased to 40 to BID,+ISS--CBGs appear satisfactory ranging from the 90s to mid 100s recently  Pt on ACEW and statin  Hypertension  Cont accupril --this appears stable recent blood pressure130/81-140/74-129/61  Morbid obesity  Diet  Pure hypercholesterolemia  Continue crestor 20 mg  Restless legs syndrome (RLS)  Daily Requipthis has been stable  History of gout-at this point  appears stable but he is concerned about diuretic aggravating this as noted above he is on colchicine as needed once a day--Lasix has been made when necessary apparently he does not take this much   of note Will obtain CBC and BMP tomorrow-notify primary care provider of her salts.  Also will need PT OT and nursing support status post left hip repair-as well as follow-up of his multiple medical conditions including diabetes.  Also will need wheelchair to help with  Ambulation . [id number] note greater than 35 minutes spent on this discharge summary-prescriptions have been written

## 2015-06-03 ENCOUNTER — Encounter: Payer: Self-pay | Admitting: Internal Medicine

## 2015-06-03 ENCOUNTER — Ambulatory Visit (INDEPENDENT_AMBULATORY_CARE_PROVIDER_SITE_OTHER): Payer: Medicare Other | Admitting: Internal Medicine

## 2015-06-03 VITALS — BP 148/82 | HR 85 | Ht 60.0 in | Wt 277.8 lb

## 2015-06-03 DIAGNOSIS — I8390 Asymptomatic varicose veins of unspecified lower extremity: Secondary | ICD-10-CM

## 2015-06-03 DIAGNOSIS — Z96649 Presence of unspecified artificial hip joint: Secondary | ICD-10-CM

## 2015-06-03 DIAGNOSIS — E7800 Pure hypercholesterolemia, unspecified: Secondary | ICD-10-CM

## 2015-06-03 DIAGNOSIS — Z9660 Presence of unspecified orthopedic joint implant: Secondary | ICD-10-CM

## 2015-06-03 DIAGNOSIS — I10 Essential (primary) hypertension: Secondary | ICD-10-CM

## 2015-06-03 DIAGNOSIS — I868 Varicose veins of other specified sites: Secondary | ICD-10-CM | POA: Diagnosis not present

## 2015-06-03 NOTE — Patient Instructions (Signed)
Your physician wants you to follow-up in: 1 Year You will receive a reminder letter in the mail two months in advance. If you don't receive a letter, please call our office to schedule the follow-up appointment.  Your physician recommends that you return for lab work Fasting LIpids

## 2015-06-03 NOTE — Progress Notes (Signed)
OFFICE NOTE  Chief Complaint:  Routine follow-up  Primary Care Physician: Stephens Shire, MD  HPI:  Jeffery Nelson is a 69 year old gentleman referred to me for lower extremity edema and shortness of breath. As you know, he is morbidly obese, almost super morbidly obese with a weight of 330 pounds, BMI of 54. He underwent an echocardiogram which demonstrated mild concentric LVH, EF greater than 55%. The left atrium was mildly dilated. However, there were no significant valvular abnormalities. Pulmonary pressures were not elevated. He also underwent lower extremity Dopplers looking for venous insufficiency. There was a small amount of reflux in the right common femoral vein, which is a deep vein. However, the superficial veins were negative for reflux or treatable venous insufficiency. I reviewed the results with the patient today and I feel that his best option is to continue with lower extremity compression stockings and work on weight loss. He is also at high risk for sleep apnea and I have referred him for a sleep study - unfortunately he never made this appointment.  Currently has no other symptoms other than he related a recent bout of shingles that he underwent. This has since resolved and he was not left with any chronic pain.  I saw Jeffery Nelson back in the office today. Overall he is doing fairly well. He had left hip replacement placement recently due to what sound like a spontaneous fracture. Apparently he has of some degree of osteopenia. He was supposed been medicine for that but is not taking it. Fortunately has had about 30-35 pound weight loss and although he never got his foot split-night sleep study, he denies any new apnea symptoms and feels like he is rested during the day. He has been getting a little more shortness of breath mostly when laying down but that is gotten better and he has no new complaints today.  PMHx:  Past Medical History  Diagnosis Date  . Shortness of  breath   . Dyspnea on exertion   . Lower extremity edema   . Benign neoplasm of colon   . Chest pain     From rib fractures  . Polyneuropathy in diabetes(357.2)   . Psychosexual dysfunction with inhibited sexual excitement   . Restless legs syndrome (RLS)   . Sleep arousal disorder   . Unspecified venous (peripheral) insufficiency   . Type II or unspecified type diabetes mellitus with unspecified complication, not stated as uncontrolled   . Pure hypercholesterolemia   . Hypertension     Past Surgical History  Procedure Laterality Date  . Lower extremity venous doppler  09/04/2012    Mild valvular insufficiency in the R Common Femoral vein w/ 1.6 sec of duration of refliux  . Transthoracic echocardiogram  09/04/2012    EF >55%, normal  . Intramedullary (im) nail intertrochanteric Left 09/21/2014    Procedure: left hip intertroch fracture;  Surgeon: [name], MD;  Location: [facility];  Service: Orthopedics;  Laterality: Left;    FAMHx:  Family History  Problem Relation Age of Onset  . Heart disease Mother   . Heart disease Father   . Diabetes Brother   . Heart disease Maternal Grandmother   . Heart disease Maternal Grandfather   . Stroke Paternal Grandmother   . Cancer Paternal Grandfather     Skin cancer    SOCHx:   reports that he has never smoked. He has never used smokeless tobacco. He reports that he does not drink alcohol or use illicit  drugs.  ALLERGIES:  Allergies  Allergen Reactions  . Lipitor [Atorvastatin]   . Lyrica [Pregabalin]     ROS: A comprehensive review of systems was negative.  HOME MEDS: Current Outpatient Prescriptions  Medication Sig Dispense Refill  . colchicine 0.6 MG tablet Take 0.6 mg by mouth as needed (for gout).     . fluocinonide cream (LIDEX) 9.75 % Apply 1 application topically as needed (rash).    [name] FLUoxetine (PROZAC) 20 MG tablet Take 20 mg by mouth daily.    . furosemide (LASIX) 20 MG tablet Take 20 mg by mouth daily.    [name]  HUMALOG MIX 75/25 (75-25) 100 UNIT/ML SUSP injection Inject 70 Units as directed daily.   2  . Multiple Vitamin (MULTIVITAMIN WITH MINERALS) TABS tablet Take 1 tablet by mouth daily.    [name] OVER THE COUNTER MEDICATION Take 1 tablet by mouth daily. Calcium 333 mg with Magnesium 133 mg and Zinc 5 mg tablets    . potassium gluconate 595 MG TABS tablet Take 595 mg by mouth daily.    . quinapril (ACCUPRIL) 20 MG tablet Take 20 mg by mouth daily.    [name] rOPINIRole (REQUIP) 4 MG tablet Take 4 mg by mouth daily.     . rosuvastatin (CRESTOR) 20 MG tablet Take 20 mg by mouth daily.     No current facility-administered medications for this visit.    LABS/IMAGING: No results found for this or any previous visit (from the past 48 hour(s)). No results found.  VITALS: BP 148/82 mmHg  Pulse 85  Ht 5' (1.524 m)  Wt 277 lb 12.8 oz (126.009 kg)  BMI 54.25 kg/m2  EXAM: General appearance: alert and no distress Neck: no carotid bruit and no JVD Lungs: clear to auscultation bilaterally Heart: regular rate and rhythm, S1, S2 normal, no murmur, click, rub or gallop Abdomen: soft, non-tender; bowel sounds normal; no masses,  no organomegaly and morbidly obese Extremities: extremities with 1+ RLE and trace LLE edema, there is a pressure sore in the right achilles area Pulses: 2+ and symmetric Skin: Skin color, texture, turgor normal. No rashes or lesions Neurologic: Grossly normal Psych: Pleasant, somewhat slow mentation  EKG: Normal sinus rhythm at 85  ASSESSMENT: 1. Morbid obesity 2. Bilateral deep vein reflux with edema, right greater than left 3. Probable obstructive sleep apnea - improved 4. Recent total L hip arthroplasty  PLAN: 1.   Jeffery Nelson unfortunately had left total hip replacement possibly related to osteopenia. He does have morbid obesity and bilateral deep vein reflux. He continues to wear compression stockings. Venous Doppler showed no evidence of varicose veins that could be repaired.  I suspect he has venous hypertension due to his truncal obesity. He did have symptoms or concerning for sleep apnea but they have improved somewhat with 30 pound weight loss. Of encouraged him to continue to work on exercise and weight loss. Plan to see him back annually or sooner as necessary.   Pixie Casino, MD, [facility] Attending Cardiologist [place] 06/03/2015, 5:06 PM

## 2016-01-15 ENCOUNTER — Ambulatory Visit (INDEPENDENT_AMBULATORY_CARE_PROVIDER_SITE_OTHER): Payer: Medicare Other | Admitting: Internal Medicine

## 2016-01-15 ENCOUNTER — Encounter: Payer: Self-pay | Admitting: Internal Medicine

## 2016-01-15 VITALS — BP 120/78 | HR 82 | Ht 64.0 in | Wt 287.6 lb

## 2016-01-15 DIAGNOSIS — I868 Varicose veins of other specified sites: Secondary | ICD-10-CM

## 2016-01-15 DIAGNOSIS — R0602 Shortness of breath: Secondary | ICD-10-CM | POA: Diagnosis not present

## 2016-01-15 DIAGNOSIS — Z79899 Other long term (current) drug therapy: Secondary | ICD-10-CM

## 2016-01-15 DIAGNOSIS — M7989 Other specified soft tissue disorders: Secondary | ICD-10-CM

## 2016-01-15 DIAGNOSIS — I10 Essential (primary) hypertension: Secondary | ICD-10-CM

## 2016-01-15 DIAGNOSIS — E7800 Pure hypercholesterolemia, unspecified: Secondary | ICD-10-CM

## 2016-01-15 DIAGNOSIS — I8390 Asymptomatic varicose veins of unspecified lower extremity: Secondary | ICD-10-CM

## 2016-01-15 DIAGNOSIS — E785 Hyperlipidemia, unspecified: Secondary | ICD-10-CM | POA: Diagnosis not present

## 2016-01-15 MED ORDER — POTASSIUM CHLORIDE CRYS ER 20 MEQ PO TBCR: 20.0000 meq | EXTENDED_RELEASE_TABLET | ORAL | Status: DC

## 2016-01-15 NOTE — Patient Instructions (Signed)
Dr Debara Pickett has recommended making the following medication changes: INCREASE Furosemide - take 2 tablets by mouth daily for 1 week then resume regular dose STOP over-the-counter potassium START Potassium (Klor-Con) 20 mEq - take 2 tablets daily for 1 week then decrease to 1 tablet (20 mEq total) daily  Your physician recommends that you return for lab work on [place].  Your physician has requested that you have an echocardiogram. Echocardiography is a painless test that uses sound waves to create images of your heart. It provides your doctor with information about the size and shape of your heart and how well your heart's chambers and valves are working. This procedure takes approximately one hour. There are no restrictions for this procedure.  Dr Debara Pickett recommends that you schedule a follow-up appointment in 2-3 weeks with him or with a NP/PA.  If you need a refill on your cardiac medications before your next appointment, please call your pharmacy.

## 2016-01-18 DIAGNOSIS — R0602 Shortness of breath: Secondary | ICD-10-CM | POA: Insufficient documentation

## 2016-01-18 DIAGNOSIS — M7989 Other specified soft tissue disorders: Secondary | ICD-10-CM

## 2016-01-18 HISTORY — DX: Shortness of breath: R06.02

## 2016-01-18 HISTORY — DX: Other specified soft tissue disorders: M79.89

## 2016-01-18 NOTE — Progress Notes (Signed)
OFFICE NOTE  Chief Complaint:  Weight gain, shortness of breath, swelling  Primary Care Physician: Stephens Shire, MD  HPI:  Jeffery Nelson is a 70 year old gentleman referred to me for lower extremity edema and shortness of breath. As you know, he is morbidly obese, almost super morbidly obese with a weight of 330 pounds, BMI of 54. He underwent an echocardiogram which demonstrated mild concentric LVH, EF greater than 55%. The left atrium was mildly dilated. However, there were no significant valvular abnormalities. Pulmonary pressures were not elevated. He also underwent lower extremity Dopplers looking for venous insufficiency. There was a small amount of reflux in the right common femoral vein, which is a deep vein. However, the superficial veins were negative for reflux or treatable venous insufficiency. I reviewed the results with the patient today and I feel that his best option is to continue with lower extremity compression stockings and work on weight loss. He is also at high risk for sleep apnea and I have referred him for a sleep study - unfortunately he never made this appointment.  Currently has no other symptoms other than he related a recent bout of shingles that he underwent. This has since resolved and he was not left with any chronic pain.  I saw [name] back in the office today. Overall he is doing fairly well. He had left hip replacement placement recently due to what sound like a spontaneous fracture. Apparently he has of some degree of osteopenia. He was supposed been medicine for that but is not taking it. Fortunately has had about 30-35 pound weight loss and although he never got his foot split-night sleep study, he denies any new apnea symptoms and feels like he is rested during the day. He has been getting a little more shortness of breath mostly when laying down but that is gotten better and he has no new complaints today.  [name] returns today in the office. He  reports that he's recently had some weight gain and lower extremity swelling. He says it is also has some shortness of breath with exertion. Swelling seems to be a little worse in the right leg than the left. He reports she's been compliant with his current dose of Lasix. He is also taking over-the-counter potassium supplements. He denies any chest pain.  PMHx:  Past Medical History  Diagnosis Date  . Shortness of breath   . Dyspnea on exertion   . Lower extremity edema   . Benign neoplasm of colon   . Chest pain     From rib fractures  . Polyneuropathy in diabetes(357.2)   . Psychosexual dysfunction with inhibited sexual excitement   . Restless legs syndrome (RLS)   . Sleep arousal disorder   . Unspecified venous (peripheral) insufficiency   . Type II or unspecified type diabetes mellitus with unspecified complication, not stated as uncontrolled   . Pure hypercholesterolemia   . Hypertension     Past Surgical History  Procedure Laterality Date  . Lower extremity venous doppler  09/04/2012    Mild valvular insufficiency in the R Common Femoral vein w/ 1.6 sec of duration of refliux  . Transthoracic echocardiogram  09/04/2012    EF >55%, normal  . Intramedullary (im) nail intertrochanteric Left 09/21/2014    Procedure: left hip intertroch fracture;  Surgeon: [name], MD;  Location: [facility];  Service: Orthopedics;  Laterality: Left;    FAMHx:  Family History  Problem Relation Age of Onset  . Heart  disease Mother   . Heart disease Father   . Diabetes Brother   . Heart disease Maternal Grandmother   . Heart disease Maternal Grandfather   . Stroke Paternal Grandmother   . Cancer Paternal Grandfather     Skin cancer    SOCHx:   reports that he has never smoked. He has never used smokeless tobacco. He reports that he does not drink alcohol or use illicit drugs.  ALLERGIES:  Allergies  Allergen Reactions  . Lipitor [Atorvastatin]   . Lyrica [Pregabalin]     ROS: A  comprehensive review of systems was negative except for: Respiratory: positive for dyspnea on exertion Cardiovascular: positive for lower extremity edema  HOME MEDS: Current Outpatient Prescriptions  Medication Sig Dispense Refill  . colchicine 0.6 MG tablet Take 0.6 mg by mouth as needed (for gout).     . fluocinonide cream (LIDEX) [id number] % Apply 1 application topically as needed (rash).    [name] FLUoxetine (PROZAC) 20 MG tablet Take 20 mg by mouth daily.    . furosemide (LASIX) 20 MG tablet Take 20 mg by mouth daily.    [name] HUMALOG MIX 75/25 (75-25) 100 UNIT/ML SUSP injection Inject 70 Units as directed daily.   2  . Multiple Vitamin (MULTIVITAMIN WITH MINERALS) TABS tablet Take 1 tablet by mouth daily.    [name] OVER THE COUNTER MEDICATION Take 1 tablet by mouth daily. Calcium 333 mg with Magnesium 133 mg and Zinc 5 mg tablets    . quinapril (ACCUPRIL) 20 MG tablet Take 20 mg by mouth daily.    [name] rOPINIRole (REQUIP) 4 MG tablet Take 4 mg by mouth daily.     . rosuvastatin (CRESTOR) 20 MG tablet Take 20 mg by mouth daily.    . potassium chloride SA (K-DUR,KLOR-CON) 20 MEQ tablet Take 1 tablet (20 mEq total) by mouth as directed. 90 tablet 3   No current facility-administered medications for this visit.    LABS/IMAGING: No results found for this or any previous visit (from the past 48 hour(s)). No results found.  VITALS: BP 120/78 mmHg  Pulse 82  Ht 5\' 4"  (1.626 m)  Wt 287 lb 9 oz (130.437 kg)  BMI 49.34 kg/m2  EXAM: General appearance: alert and no distress Neck: no carotid bruit and no JVD Lungs: clear to auscultation bilaterally Heart: regular rate and rhythm, S1, S2 normal, no murmur, click, rub or gallop Abdomen: soft, non-tender; bowel sounds normal; no masses,  no organomegaly and morbidly obese Extremities: extremities with 1+ RLE and trace LLE edema, there is a pressure sore in the right achilles area Pulses: 2+ and symmetric Skin: Skin color, texture, turgor normal. No rashes or  lesions Neurologic: Grossly normal Psych: Pleasant, somewhat slow mentation  EKG: Normal sinus rhythm at 82 with PACs, LVH with repolarization changes  ASSESSMENT: 1. Dyspnea, weight gain, leg swelling 2. Morbid obesity 3. Bilateral deep vein reflux with edema, right greater than left 4. Probable obstructive sleep apnea - improved 5. Recent total L hip arthroplasty  PLAN: 1.   Mr. Sou has recently had some shortness of breath and worsening leg swelling. He is known to have bilateral deep vein reflux, however the shortness of breath and some weight gain is concerning for cardiovascular cause. I like to get a repeat echo. I recommended doubling up his Lasix to 40 mg daily for the next several days and then decreasing it back to his home dose. We'll also prescribe him potassium supplements to take and advised him  to stop his over-the-counter potassium gluconate. He'll get lab work early next week for recheck of his renal function and will need follow-up in 1-2 weeks with the mid-level provider.  Pixie Casino, MD, [facility] Attending Cardiologist [place] 01/18/2016, 5:31 PM

## 2016-01-29 ENCOUNTER — Ambulatory Visit (HOSPITAL_COMMUNITY): Payer: Medicare Other | Attending: Cardiology

## 2016-01-29 ENCOUNTER — Other Ambulatory Visit: Payer: Self-pay

## 2016-01-29 DIAGNOSIS — I517 Cardiomegaly: Secondary | ICD-10-CM | POA: Diagnosis not present

## 2016-01-29 DIAGNOSIS — E7800 Pure hypercholesterolemia, unspecified: Secondary | ICD-10-CM | POA: Insufficient documentation

## 2016-01-29 DIAGNOSIS — E119 Type 2 diabetes mellitus without complications: Secondary | ICD-10-CM | POA: Diagnosis not present

## 2016-01-29 DIAGNOSIS — R0600 Dyspnea, unspecified: Secondary | ICD-10-CM | POA: Diagnosis present

## 2016-01-29 DIAGNOSIS — I358 Other nonrheumatic aortic valve disorders: Secondary | ICD-10-CM | POA: Insufficient documentation

## 2016-01-29 DIAGNOSIS — Z6841 Body Mass Index (BMI) 40.0 and over, adult: Secondary | ICD-10-CM | POA: Insufficient documentation

## 2016-01-29 DIAGNOSIS — I10 Essential (primary) hypertension: Secondary | ICD-10-CM | POA: Insufficient documentation

## 2016-01-29 DIAGNOSIS — R0602 Shortness of breath: Secondary | ICD-10-CM | POA: Diagnosis not present

## 2016-01-29 DIAGNOSIS — M7989 Other specified soft tissue disorders: Secondary | ICD-10-CM | POA: Diagnosis not present

## 2016-01-29 DIAGNOSIS — I071 Rheumatic tricuspid insufficiency: Secondary | ICD-10-CM | POA: Insufficient documentation

## 2016-02-02 ENCOUNTER — Telehealth: Payer: Self-pay | Admitting: Internal Medicine

## 2016-02-02 NOTE — Telephone Encounter (Signed)
Pt says he has an appointment on Wednesday,needs to talk to a nurse please.

## 2016-02-02 NOTE — Telephone Encounter (Signed)
Returned call to patient, questions answered regarding fasting lab test. No further concerns.

## 2016-02-04 ENCOUNTER — Encounter: Payer: Self-pay | Admitting: Internal Medicine

## 2016-02-04 ENCOUNTER — Ambulatory Visit (INDEPENDENT_AMBULATORY_CARE_PROVIDER_SITE_OTHER): Payer: Medicare Other | Admitting: Internal Medicine

## 2016-02-04 ENCOUNTER — Other Ambulatory Visit: Payer: Self-pay | Admitting: *Deleted

## 2016-02-04 VITALS — BP 152/94 | HR 80 | Ht 64.0 in | Wt 281.3 lb

## 2016-02-04 DIAGNOSIS — R0602 Shortness of breath: Secondary | ICD-10-CM

## 2016-02-04 DIAGNOSIS — I429 Cardiomyopathy, unspecified: Secondary | ICD-10-CM | POA: Insufficient documentation

## 2016-02-04 DIAGNOSIS — R5383 Other fatigue: Secondary | ICD-10-CM

## 2016-02-04 DIAGNOSIS — D689 Coagulation defect, unspecified: Secondary | ICD-10-CM

## 2016-02-04 DIAGNOSIS — Z79899 Other long term (current) drug therapy: Secondary | ICD-10-CM

## 2016-02-04 DIAGNOSIS — Z01818 Encounter for other preprocedural examination: Secondary | ICD-10-CM | POA: Diagnosis not present

## 2016-02-04 DIAGNOSIS — M7989 Other specified soft tissue disorders: Secondary | ICD-10-CM

## 2016-02-04 HISTORY — DX: Cardiomyopathy, unspecified: I42.9

## 2016-02-04 LAB — LIPID PANEL
CHOL/HDL RATIO: 2.7 ratio (ref <=5.0)
Cholesterol: 114 mg/dL — ABNORMAL LOW (ref 125–200)
HDL: 42 mg/dL (ref >=40)
LDL CALC: 52 mg/dL (ref <130)
Triglycerides: 99 mg/dL (ref <150)
VLDL: 20 mg/dL (ref <30)

## 2016-02-04 LAB — COMPREHENSIVE METABOLIC PANEL
ALT: 13 U/L (ref 9–46)
AST: 15 U/L (ref 10–35)
Albumin: 3.7 g/dL (ref 3.6–5.1)
Alkaline Phosphatase: 105 U/L (ref 40–115)
BUN: 20 mg/dL (ref 7–25)
CHLORIDE: 100 mmol/L (ref 98–110)
CO2: 27 mmol/L (ref 20–31)
Calcium: 8.7 mg/dL (ref 8.6–10.3)
Creat: 0.82 mg/dL (ref 0.70–1.18)
Glucose, Bld: 174 mg/dL — ABNORMAL HIGH (ref 65–99)
POTASSIUM: 4.3 mmol/L (ref 3.5–5.3)
Sodium: 138 mmol/L (ref 135–146)
Total Bilirubin: 0.5 mg/dL (ref 0.2–1.2)
Total Protein: 6.4 g/dL (ref 6.1–8.1)

## 2016-02-04 LAB — BRAIN NATRIURETIC PEPTIDE: Brain Natriuretic Peptide: 294.6 pg/mL — ABNORMAL HIGH (ref <100)

## 2016-02-04 MED ORDER — FUROSEMIDE 40 MG PO TABS: 40.0000 mg | ORAL_TABLET | Freq: Every day | ORAL | Status: DC

## 2016-02-04 NOTE — Progress Notes (Signed)
OFFICE NOTE  Chief Complaint:  Follow-up echo  Primary Care Physician: Stephens Shire, MD  HPI:  Jeffery Nelson is a 70 year old gentleman referred to me for lower extremity edema and shortness of breath. As you know, he is morbidly obese, almost super morbidly obese with a weight of 330 pounds, BMI of 54. He underwent an echocardiogram which demonstrated mild concentric LVH, EF greater than 55%. The left atrium was mildly dilated. However, there were no significant valvular abnormalities. Pulmonary pressures were not elevated. He also underwent lower extremity Dopplers looking for venous insufficiency. There was a small amount of reflux in the right common femoral vein, which is a deep vein. However, the superficial veins were negative for reflux or treatable venous insufficiency. I reviewed the results with the patient today and I feel that his best option is to continue with lower extremity compression stockings and work on weight loss. He is also at high risk for sleep apnea and I have referred him for a sleep study - unfortunately he never made this appointment.  Currently has no other symptoms other than he related a recent bout of shingles that he underwent. This has since resolved and he was not left with any chronic pain.  I saw [name] back in the office today. Overall he is doing fairly well. He had left hip replacement placement recently due to what sound like a spontaneous fracture. Apparently he has of some degree of osteopenia. He was supposed been medicine for that but is not taking it. Fortunately has had about 30-35 pound weight loss and although he never got his foot split-night sleep study, he denies any new apnea symptoms and feels like he is rested during the day. He has been getting a little more shortness of breath mostly when laying down but that is gotten better and he has no new complaints today.  [name] returns today in the office. He reports that he's recently  had some weight gain and lower extremity swelling. He says it is also has some shortness of breath with exertion. Swelling seems to be a little worse in the right leg than the left. He reports she's been compliant with his current dose of Lasix. He is also taking over-the-counter potassium supplements. He denies any chest pain.  I saw [name] back today in follow-up. He reports he still persistently short of breath. I asked him to increase his Lasix and prescribe some potassium however he said he didn't have the money to get potassium and therefore was nervous about taking extra Lasix so he never increased the dose the medicine. In the interim though he's gained about 6 pounds. He does have lower strandy swelling and persistent shortness of breath. His echocardiogram unfortunate shows a new cardiomyopathy with an EF of 20-25% with global hypokinesis which is severe. Previous echo at least a year ago showed an EF which was normal. He denies any chest pain episodes or recent significant viral illnesses that might explain his reduced LV function. I'm surly concerned about coronary artery disease. He also denies any alcohol or drug use.  PMHx:  Past Medical History  Diagnosis Date  . Shortness of breath   . Dyspnea on exertion   . Lower extremity edema   . Benign neoplasm of colon   . Chest pain     From rib fractures  . Polyneuropathy in diabetes(357.2)   . Psychosexual dysfunction with inhibited sexual excitement   . Restless legs syndrome (RLS)   .  Sleep arousal disorder   . Unspecified venous (peripheral) insufficiency   . Type II or unspecified type diabetes mellitus with unspecified complication, not stated as uncontrolled   . Pure hypercholesterolemia   . Hypertension     Past Surgical History  Procedure Laterality Date  . Lower extremity venous doppler  09/04/2012    Mild valvular insufficiency in the R Common Femoral vein w/ 1.6 sec of duration of refliux  . Transthoracic  echocardiogram  09/04/2012    EF >55%, normal  . Intramedullary (im) nail intertrochanteric Left 09/21/2014    Procedure: left hip intertroch fracture;  Surgeon: [name], MD;  Location: [facility];  Service: Orthopedics;  Laterality: Left;    FAMHx:  Family History  Problem Relation Age of Onset  . Heart disease Mother   . Heart disease Father   . Diabetes Brother   . Heart disease Maternal Grandmother   . Heart disease Maternal Grandfather   . Stroke Paternal Grandmother   . Cancer Paternal Grandfather     Skin cancer    SOCHx:   reports that he has never smoked. He has never used smokeless tobacco. He reports that he does not drink alcohol or use illicit drugs.  ALLERGIES:  Allergies  Allergen Reactions  . Lipitor [Atorvastatin]   . Lyrica [Pregabalin]     ROS: A comprehensive review of systems was negative except for: Respiratory: positive for dyspnea on exertion Cardiovascular: positive for lower extremity edema  HOME MEDS: Current Outpatient Prescriptions  Medication Sig Dispense Refill  . aspirin EC 81 MG tablet Take 81 mg by mouth daily.    . colchicine 0.6 MG tablet Take 0.6 mg by mouth as needed (for gout).     . fluocinonide cream (LIDEX) [id number] % Apply 1 application topically as needed (rash).    [name] FLUoxetine (PROZAC) 20 MG tablet Take 20 mg by mouth daily.    . furosemide (LASIX) 40 MG tablet Take 1 tablet (40 mg total) by mouth daily. 30 tablet 6  . HUMALOG MIX 75/25 (75-25) 100 UNIT/ML SUSP injection Inject 70 Units as directed daily.   2  . Multiple Vitamin (MULTIVITAMIN WITH MINERALS) TABS tablet Take 1 tablet by mouth daily.    [name] OVER THE COUNTER MEDICATION Take 1 tablet by mouth daily. Calcium 333 mg with Magnesium 133 mg and Zinc 5 mg tablets    . potassium chloride SA (K-DUR,KLOR-CON) 20 MEQ tablet Take 1 tablet (20 mEq total) by mouth as directed. 90 tablet 3  . quinapril (ACCUPRIL) 20 MG tablet Take 20 mg by mouth daily.    [name] rOPINIRole (REQUIP) 4 MG  tablet Take 4 mg by mouth daily.     . rosuvastatin (CRESTOR) 20 MG tablet Take 20 mg by mouth daily.     No current facility-administered medications for this visit.    LABS/IMAGING: Results for orders placed or performed in visit on 01/15/16 (from the past 48 hour(s))  Comprehensive metabolic panel     Status: Abnormal   Collection Time: 02/03/16 10:52 AM  Result Value Ref Range   Sodium 138 135 - 146 mmol/L   Potassium 4.3 3.5 - 5.3 mmol/L   Chloride 100 98 - 110 mmol/L   CO2 27 20 - 31 mmol/L   Glucose, Bld 174 (H) 65 - 99 mg/dL   BUN 20 7 - 25 mg/dL   Creat 0.82 0.70 - 1.18 mg/dL   Total Bilirubin 0.5 0.2 - 1.2 mg/dL   Alkaline Phosphatase 105 40 -  115 U/L   AST 15 10 - 35 U/L   ALT 13 9 - 46 U/L   Total Protein 6.4 6.1 - 8.1 g/dL   Albumin 3.7 3.6 - 5.1 g/dL   Calcium 8.7 8.6 - 10.3 mg/dL  Brain natriuretic peptide     Status: Abnormal   Collection Time: 02/03/16 10:52 AM  Result Value Ref Range   Brain Natriuretic Peptide 294.6 (H) <100 pg/mL    Comment:   BNP levels increase with age in the general population with the highest values seen in individuals greater than 49 years of age. Reference: Joellyn Rued Cardiol 2002; [id number].   ** Please note change in reference range(s). **     Lipid panel     Status: Abnormal   Collection Time: 02/03/16 10:52 AM  Result Value Ref Range   Cholesterol 114 (L) 125 - 200 mg/dL   Triglycerides 99 <150 mg/dL   HDL 42 >=40 mg/dL   Total CHOL/HDL Ratio 2.7 <=5.0 Ratio   VLDL 20 <30 mg/dL   LDL Cholesterol 52 <130 mg/dL    Comment:   Total Cholesterol/HDL Ratio:CHD Risk                        Coronary Heart Disease Risk Table                                        Men       Women          1/2 Average Risk              3.4        3.3              Average Risk              5.0        4.4           2X Average Risk              9.6        7.1           3X Average Risk             23.4       11.0 Use the calculated Patient Ratio  above and the CHD Risk table  to determine the patient's CHD Risk.    No results found.  VITALS: BP 152/94 mmHg  Pulse 80  Ht 5\' 4"  (1.626 m)  Wt 281 lb 4.8 oz (127.597 kg)  BMI 48.26 kg/m2  EXAM: Deferred  EKG: deferred  ASSESSMENT: 1. New systolic congestive heart failure with EF 20-25% 2. Dyspnea, weight gain, leg swelling 3. Morbid obesity 4. Bilateral deep vein reflux with edema, right greater than left 5. Probable obstructive sleep apnea - improved 6. Recent total L hip arthroplasty  PLAN: 1.   Mr. Weddell has had recent onset of worsening shortness of breath and lower extremity swelling. He's had continued weight gain. Unfortunately never increased the Lasix as I instructed. I like for him to go up to 40 mg daily today and start taking low-dose aspirin. He will also get his potassium filled. Based on his echo he will need further evaluation of the cause of his cardiomyopathy. I'm recommending left and right heart catheterization in the next couple of weeks. I did discuss the risks  and benefits as well as alternatives of the procedure with him today the office and he is agreeable to proceeding. As far as additional heart failure treatment, he is on enalapril and not currently on a beta blocker. I like to hold off on starting that until we were certain that he's more compensated.  Plan follow-up after his catheterization.  Pixie Casino, MD, [facility] Attending Cardiologist [place] 02/04/2016, 12:56 PM

## 2016-02-04 NOTE — Patient Instructions (Signed)
Medication Instructions:   1. INCREASE furosemide (lasix) to 40mg  daily - you can take two of the 20mg  tablets 2. Pick up prescription for potassium 3. START aspirin 81mg  once daily  Labwork:  Lab work NO MORE than 7 days prior to hospital procedure  Testing/Procedures:  Chest X-ray @ [address] prior to hospital procedure  Follow-Up:  2 weeks after right & left heart cath with Dr. Debara Pickett  Any Other Special Instructions Will Be Listed Below (If Applicable).   Your physician has requested that you have a cardiac catheterization @ [facility] with Dr. Debara Pickett. Cardiac catheterization is used to diagnose and/or treat various heart conditions. Doctors may recommend this procedure for a number of different reasons. The most common reason is to evaluate chest pain. Chest pain can be a symptom of coronary artery disease (CAD), and cardiac catheterization can show whether plaque is narrowing or blocking your heart's arteries. This procedure is also used to evaluate the valves, as well as measure the blood flow and oxygen levels in different parts of your heart. For further information please visit [email or web address]. Please follow instruction sheet, as given.  Following your catheterization, you will not be allowed to drive for 3 days.  No lifting, pushing, or pulling greater that 10 pounds is allowed for 1 week.  You will be required to have the following tests prior to the procedure:  1. Blood work - the blood work can be done no more than 7 days prior to the procedure.  It can be done at any [facility] lab. There is a lab downstairs on the first floor of this building in [address] and one at [address].  2. Chest X-ray - this can be done at [facility] in the [place] @ [address]

## 2016-02-05 ENCOUNTER — Ambulatory Visit
Admission: RE | Admit: 2016-02-05 | Discharge: 2016-02-05 | Disposition: A | Discharge status: Home / Self Care | Payer: Medicare Other | Source: Ambulatory Visit | Attending: Internal Medicine | Admitting: Internal Medicine

## 2016-02-05 DIAGNOSIS — Z01818 Encounter for other preprocedural examination: Secondary | ICD-10-CM

## 2016-02-05 DIAGNOSIS — I429 Cardiomyopathy, unspecified: Secondary | ICD-10-CM

## 2016-02-05 LAB — CBC
HCT: 40.8 % (ref 39.0–52.0)
Hemoglobin: 13.1 g/dL (ref 13.0–17.0)
MCH: 26.8 pg (ref 26.0–34.0)
MCHC: 32.1 g/dL (ref 30.0–36.0)
MCV: 83.4 fL (ref 78.0–100.0)
MPV: 10.3 fL (ref 8.6–12.4)
PLATELETS: 188 10*3/uL (ref 150–400)
RBC: 4.89 MIL/uL (ref 4.22–5.81)
RDW: 14.4 % (ref 11.5–15.5)
WBC: 5.2 10*3/uL (ref 4.0–10.5)

## 2016-02-05 LAB — BASIC METABOLIC PANEL
BUN: 18 mg/dL (ref 7–25)
CALCIUM: 8.8 mg/dL (ref 8.6–10.3)
CO2: 31 mmol/L (ref 20–31)
CREATININE: 0.7 mg/dL (ref 0.70–1.18)
Chloride: 100 mmol/L (ref 98–110)
GLUCOSE: 199 mg/dL — AB (ref 65–99)
Potassium: 4.4 mmol/L (ref 3.5–5.3)
SODIUM: 137 mmol/L (ref 135–146)

## 2016-02-05 LAB — TSH: TSH: 0.91 mIU/L (ref 0.40–4.50)

## 2016-02-06 ENCOUNTER — Telehealth: Payer: Self-pay | Admitting: Internal Medicine

## 2016-02-06 LAB — PROTIME-INR
INR: 1.08 (ref <1.50)
PROTHROMBIN TIME: 14.1 s (ref 11.6–15.2)

## 2016-02-06 LAB — APTT: APTT: 34 s (ref 24–37)

## 2016-02-06 NOTE — Telephone Encounter (Signed)
Patient notified to hold insulin AM of procedure. Patient aware of labs & cxr results

## 2016-02-06 NOTE — Telephone Encounter (Signed)
-----   Message from Pixie Casino, MD sent at 02/04/2016  3:33 PM EST ----- Regarding: RE: cath 2/23 Hold insulin in am of procedure.  Dr. Debara Pickett  ----- Message -----    From: Fidel Levy, RN    Sent: 02/04/2016   3:28 PM      To: Pixie Casino, MD Subject: cath 2/23                                      Patient takes 75/25 insulin..   Hold regardless if he takes QAM or QHS? Or half dose?  Thanks

## 2016-02-12 ENCOUNTER — Encounter (HOSPITAL_COMMUNITY)
Admission: RE | Disposition: A | Discharge status: Home / Self Care | Payer: Self-pay | Source: Ambulatory Visit | Attending: Internal Medicine

## 2016-02-12 ENCOUNTER — Ambulatory Visit (HOSPITAL_COMMUNITY)
Admission: RE | Admit: 2016-02-12 | Discharge: 2016-02-12 | Disposition: A | Discharge status: Home / Self Care | Payer: Medicare Other | Source: Ambulatory Visit | Attending: Internal Medicine | Admitting: Internal Medicine

## 2016-02-12 DIAGNOSIS — I2510 Atherosclerotic heart disease of native coronary artery without angina pectoris: Secondary | ICD-10-CM | POA: Diagnosis not present

## 2016-02-12 DIAGNOSIS — I429 Cardiomyopathy, unspecified: Secondary | ICD-10-CM | POA: Insufficient documentation

## 2016-02-12 DIAGNOSIS — B3789 Other sites of candidiasis: Secondary | ICD-10-CM | POA: Diagnosis present

## 2016-02-12 DIAGNOSIS — I272 Other secondary pulmonary hypertension: Secondary | ICD-10-CM | POA: Insufficient documentation

## 2016-02-12 DIAGNOSIS — I10 Essential (primary) hypertension: Secondary | ICD-10-CM | POA: Diagnosis present

## 2016-02-12 DIAGNOSIS — E7800 Pure hypercholesterolemia, unspecified: Secondary | ICD-10-CM | POA: Diagnosis present

## 2016-02-12 DIAGNOSIS — E118 Type 2 diabetes mellitus with unspecified complications: Secondary | ICD-10-CM | POA: Diagnosis present

## 2016-02-12 HISTORY — PX: CARDIAC CATHETERIZATION: SHX172

## 2016-02-12 HISTORY — DX: Other sites of candidiasis: B37.89

## 2016-02-12 LAB — POCT I-STAT 3, ART BLOOD GAS (G3+)
ACID-BASE DEFICIT: 1 mmol/L (ref 0.0–2.0)
Bicarbonate: 24.2 mEq/L — ABNORMAL HIGH (ref 20.0–24.0)
O2 SAT: 98 %
TCO2: 26 mmol/L (ref 0–100)
pCO2 arterial: 43.5 mmHg (ref 35.0–45.0)
pH, Arterial: 7.354 (ref 7.350–7.450)
pO2, Arterial: 111 mmHg — ABNORMAL HIGH (ref 80.0–100.0)

## 2016-02-12 LAB — POCT I-STAT 3, VENOUS BLOOD GAS (G3P V)
ACID-BASE DEFICIT: 1 mmol/L (ref 0.0–2.0)
BICARBONATE: 24.8 meq/L — AB (ref 20.0–24.0)
O2 Saturation: 70 %
PH VEN: 7.332 — AB (ref 7.250–7.300)
TCO2: 26 mmol/L (ref 0–100)
pCO2, Ven: 46.9 mmHg (ref 45.0–50.0)
pO2, Ven: 39 mmHg (ref 30.0–45.0)

## 2016-02-12 LAB — GLUCOSE, CAPILLARY
GLUCOSE-CAPILLARY: 127 mg/dL — AB (ref 65–99)
Glucose-Capillary: 176 mg/dL — ABNORMAL HIGH (ref 65–99)

## 2016-02-12 SURGERY — RIGHT/LEFT HEART CATH AND CORONARY ANGIOGRAPHY
Anesthesia: LOCAL

## 2016-02-12 MED ORDER — SODIUM CHLORIDE 0.9 % IV SOLN: 250.0000 mL | INTRAVENOUS | Status: DC | PRN

## 2016-02-12 MED ORDER — ONDANSETRON HCL 4 MG/2ML IJ SOLN
4.0000 mg | Freq: Four times a day (QID) | INTRAMUSCULAR | Status: DC | PRN
Start: 2016-02-12 — End: 2016-02-12

## 2016-02-12 MED ORDER — ASPIRIN 81 MG PO CHEW: 81.0000 mg | CHEWABLE_TABLET | ORAL | Status: DC

## 2016-02-12 MED ORDER — ACETAMINOPHEN 325 MG PO TABS: 650.0000 mg | ORAL_TABLET | ORAL | Status: DC | PRN

## 2016-02-12 MED ORDER — NYSTATIN 100000 UNIT/GM EX POWD
Freq: Two times a day (BID) | CUTANEOUS | Status: DC
  Administered 2016-02-12: 13:00:00 via TOPICAL
  Filled 2016-02-12: qty 15

## 2016-02-12 MED ORDER — FUROSEMIDE 40 MG PO TABS: 80.0000 mg | ORAL_TABLET | Freq: Two times a day (BID) | ORAL | Status: DC

## 2016-02-12 MED ORDER — SODIUM CHLORIDE 0.9% FLUSH: 3.0000 mL | Freq: Two times a day (BID) | INTRAVENOUS | Status: DC

## 2016-02-12 MED ORDER — SODIUM CHLORIDE 0.9 % WEIGHT BASED INFUSION
1.0000 mL/kg/h | INTRAVENOUS | Status: DC
Start: 2016-02-12 — End: 2016-02-12

## 2016-02-12 MED ORDER — FUROSEMIDE 80 MG PO TABS: 80.0000 mg | ORAL_TABLET | Freq: Two times a day (BID) | ORAL | Status: DC

## 2016-02-12 MED ORDER — IOHEXOL 350 MG/ML SOLN
INTRAVENOUS | Status: DC | PRN
  Administered 2016-02-12: 45 mL via INTRA_ARTERIAL

## 2016-02-12 MED ORDER — MIDAZOLAM HCL 2 MG/2ML IJ SOLN
INTRAMUSCULAR | Status: AC
  Filled 2016-02-12: qty 2

## 2016-02-12 MED ORDER — LIDOCAINE HCL (PF) 1 % IJ SOLN
INTRAMUSCULAR | Status: AC
  Filled 2016-02-12: qty 30

## 2016-02-12 MED ORDER — HEPARIN SODIUM (PORCINE) 1000 UNIT/ML IJ SOLN
INTRAMUSCULAR | Status: DC | PRN
  Administered 2016-02-12 (×2): 2500 [IU] via INTRAVENOUS

## 2016-02-12 MED ORDER — SODIUM CHLORIDE 0.9% FLUSH: 3.0000 mL | INTRAVENOUS | Status: DC | PRN

## 2016-02-12 MED ORDER — HEPARIN (PORCINE) IN NACL 2-0.9 UNIT/ML-% IJ SOLN
INTRAMUSCULAR | Status: AC
  Filled 2016-02-12: qty 500

## 2016-02-12 MED ORDER — VERAPAMIL HCL 2.5 MG/ML IV SOLN
INTRAVENOUS | Status: AC
  Filled 2016-02-12: qty 2

## 2016-02-12 MED ORDER — HEPARIN SODIUM (PORCINE) 1000 UNIT/ML IJ SOLN
INTRAMUSCULAR | Status: AC
  Filled 2016-02-12: qty 1

## 2016-02-12 MED ORDER — VERAPAMIL HCL 2.5 MG/ML IV SOLN
INTRA_ARTERIAL | Status: DC | PRN
  Administered 2016-02-12 (×2): 5 mL via INTRA_ARTERIAL

## 2016-02-12 MED ORDER — FENTANYL CITRATE (PF) 100 MCG/2ML IJ SOLN
INTRAMUSCULAR | Status: AC
  Filled 2016-02-12: qty 2

## 2016-02-12 MED ORDER — FUROSEMIDE 40 MG PO TABS: 40.0000 mg | ORAL_TABLET | Freq: Two times a day (BID) | ORAL | Status: DC

## 2016-02-12 MED ORDER — LIDOCAINE HCL (PF) 1 % IJ SOLN
INTRAMUSCULAR | Status: DC | PRN
  Administered 2016-02-12 (×2): 5 mL via SUBCUTANEOUS

## 2016-02-12 MED ORDER — NITROGLYCERIN 1 MG/10 ML FOR IR/CATH LAB
INTRA_ARTERIAL | Status: AC
  Filled 2016-02-12: qty 10

## 2016-02-12 MED ORDER — FENTANYL CITRATE (PF) 100 MCG/2ML IJ SOLN
INTRAMUSCULAR | Status: DC | PRN
  Administered 2016-02-12 (×2): 25 ug via INTRAVENOUS

## 2016-02-12 MED ORDER — SODIUM CHLORIDE 0.9 % IV SOLN
INTRAVENOUS | Status: DC
  Administered 2016-02-12: 1000 mL via INTRAVENOUS

## 2016-02-12 MED ORDER — NYSTATIN 100000 UNIT/GM EX POWD: CUTANEOUS | Status: DC

## 2016-02-12 MED ORDER — MIDAZOLAM HCL 2 MG/2ML IJ SOLN
INTRAMUSCULAR | Status: DC | PRN
  Administered 2016-02-12 (×2): 1 mg via INTRAVENOUS

## 2016-02-12 SURGICAL SUPPLY — 13 items
CATH BALLN WEDGE 5F 110CM (CATHETERS) ×2 IMPLANT
CATH INFINITI 5 FR JL3.5 (CATHETERS) ×2 IMPLANT
CATH INFINITI JR4 5F (CATHETERS) ×2 IMPLANT
CATH OPTITORQUE TIG 4.0 5F (CATHETERS) ×2 IMPLANT
DEVICE RAD COMP TR BAND LRG (VASCULAR PRODUCTS) ×2 IMPLANT
GLIDESHEATH SLEND SS 6F .021 (SHEATH) ×2 IMPLANT
KIT HEART LEFT (KITS) ×2 IMPLANT
PACK CARDIAC CATHETERIZATION (CUSTOM PROCEDURE TRAY) ×2 IMPLANT
SHEATH FAST CATH BRACH 5F 5CM (SHEATH) ×2 IMPLANT
TRANSDUCER W/STOPCOCK (MISCELLANEOUS) ×2 IMPLANT
TUBING CIL FLEX 10 FLL-RA (TUBING) ×2 IMPLANT
WIRE HI TORQ VERSACORE-J 145CM (WIRE) ×2 IMPLANT
WIRE SAFE-T 1.5MM-J .035X260CM (WIRE) ×2 IMPLANT

## 2016-02-12 NOTE — Research (Signed)
CADLAD Informed Consent   Subject Name: Jeffery Nelson  Subject met inclusion and exclusion criteria.  The informed consent form, study requirements and expectations were reviewed with the subject and questions and concerns were addressed prior to the signing of the consent form.  The subject verbalized understanding of the trail requirements.  The subject agreed to participate in the CADLAD trial and signed the informed consent.  The informed consent was obtained prior to performance of any protocol-specific procedures for the subject.  A copy of the signed informed consent was given to the subject and a copy was placed in the subject's medical record.  [name] 02/12/2016, 12:27 PM

## 2016-02-12 NOTE — Discharge Instructions (Signed)
Radial Site Care °Refer to this sheet in the next few weeks. These instructions provide you with information about caring for yourself after your procedure. Your health care provider may also give you more specific instructions. Your treatment has been planned according to current medical practices, but problems sometimes occur. Call your health care provider if you have any problems or questions after your procedure. °WHAT TO EXPECT AFTER THE PROCEDURE °After your procedure, it is typical to have the following: °· Bruising at the radial site that usually fades within 1-2 weeks. °· Blood collecting in the tissue (hematoma) that may be painful to the touch. It should usually decrease in size and tenderness within 1-2 weeks. °HOME CARE INSTRUCTIONS °· Take medicines only as directed by your health care provider. °· You may shower 24-48 hours after the procedure or as directed by your health care provider. Remove the bandage (dressing) and gently wash the site with plain soap and water. Pat the area dry with a clean towel. Do not rub the site, because this may cause bleeding. °· Do not take baths, swim, or use a hot tub until your health care provider approves. °· Check your insertion site every day for redness, swelling, or drainage. °· Do not apply powder or lotion to the site. °· Do not flex or bend the affected arm for 24 hours or as directed by your health care provider. °· Do not push or pull heavy objects with the affected arm for 24 hours or as directed by your health care provider. °· Do not lift over 10 lb (4.5 kg) for 5 days after your procedure or as directed by your health care provider. °· Ask your health care provider when it is okay to: °¨ Return to work or school. °¨ Resume usual physical activities or sports. °¨ Resume sexual activity. °· Do not drive home if you are discharged the same day as the procedure. Have someone else drive you. °· You may drive 24 hours after the procedure unless otherwise  instructed by your health care provider. °· Do not operate machinery or power tools for 24 hours after the procedure. °· If your procedure was done as an outpatient procedure, which means that you went home the same day as your procedure, a responsible adult should be with you for the first 24 hours after you arrive home. °· Keep all follow-up visits as directed by your health care provider. This is important. °SEEK MEDICAL CARE IF: °· You have a fever. °· You have chills. °· You have increased bleeding from the radial site. Hold pressure on the site. °SEEK IMMEDIATE MEDICAL CARE IF: °· You have unusual pain at the radial site. °· You have redness, warmth, or swelling at the radial site. °· You have drainage (other than a small amount of blood on the dressing) from the radial site. °· The radial site is bleeding, and the bleeding does not stop after 30 minutes of holding steady pressure on the site. °· Your arm or hand becomes pale, cool, tingly, or numb. °  °This information is not intended to replace advice given to you by your health care provider. Make sure you discuss any questions you have with your health care provider. °  °Document Released: 01/08/2011 Document Revised: 12/27/2014 Document Reviewed: 06/24/2014 °Elsevier Interactive Patient Education ©2016 [employer] °Venogram, Care After °Refer to this sheet in the next few weeks. These instructions provide you with information on caring for yourself after your procedure. Your health care provider   may also give you more specific instructions. Your treatment has been planned according to current medical practices, but problems sometimes occur. Call your health care provider if you have any problems or questions after your procedure. °WHAT TO EXPECT AFTER THE PROCEDURE °After your procedure, it is typical to have the following sensations: °· Mild discomfort at the catheter insertion site. °HOME CARE INSTRUCTIONS  °· Take all medicines exactly as  directed. °· Follow any prescribed diet. °· Follow instructions regarding both rest and physical activity. °· Drink more fluids for the first several days after the procedure in order to help flush dye from your kidneys. °SEEK MEDICAL CARE IF: °· You develop a rash. °· You have fever not controlled by medicine. °SEEK IMMEDIATE MEDICAL CARE IF: °· There is pain, drainage, bleeding, redness, swelling, warmth or a red streak at the site of the IV tube. °· The extremity where your IV tube was placed becomes discolored, numb, or cool. °· You have difficulty breathing or shortness of breath. °· You develop chest pain. °· You have excessive dizziness or fainting. °  °This information is not intended to replace advice given to you by your health care provider. Make sure you discuss any questions you have with your health care provider. °  °Document Released: 09/26/2013 Document Revised: 12/11/2013 Document Reviewed: 09/26/2013 °Elsevier Interactive Patient Education ©2016 [employer] ° °

## 2016-02-12 NOTE — H&P (Signed)
     INTERVAL PROCEDURE H&P  History and Physical Interval Note:  02/12/2016 8:37 AM  [name] has presented today for their planned procedure. The various methods of treatment have been discussed with the patient and family. After consideration of risks, benefits and other options for treatment, the patient has consented to the procedure.  The patients' outpatient history has been reviewed, patient examined, and no change in status from most recent office note within the past 30 days. I have reviewed the patients' chart and labs and will proceed as planned. Questions were answered to the patient's satisfaction.   Cath Lab Visit (complete for each Cath Lab visit)  Clinical Evaluation Leading to the Procedure:   ACS: No.  Non-ACS:    Anginal Classification: No Symptoms  Anti-ischemic medical therapy: Maximal Therapy (2 or more classes of medications)  Non-Invasive Test Results: No non-invasive testing performed  Prior CABG: No previous CABG  Pixie Casino, MD, [facility] Attending Cardiologist Padroni C Juliann Olesky 02/12/2016, 8:37 AM

## 2016-02-13 ENCOUNTER — Encounter (HOSPITAL_COMMUNITY): Payer: Self-pay | Admitting: Internal Medicine

## 2016-02-13 MED FILL — Heparin Sodium (Porcine) 2 Unit/ML in Sodium Chloride 0.9%: INTRAMUSCULAR | Qty: 500 | Status: AC

## 2016-03-03 ENCOUNTER — Ambulatory Visit (INDEPENDENT_AMBULATORY_CARE_PROVIDER_SITE_OTHER): Payer: Medicare Other | Admitting: Internal Medicine

## 2016-03-03 ENCOUNTER — Encounter: Payer: Self-pay | Admitting: Internal Medicine

## 2016-03-03 VITALS — BP 130/74 | HR 96 | Ht 62.0 in | Wt 258.0 lb

## 2016-03-03 DIAGNOSIS — M7989 Other specified soft tissue disorders: Secondary | ICD-10-CM

## 2016-03-03 DIAGNOSIS — Z008 Encounter for other general examination: Secondary | ICD-10-CM | POA: Diagnosis not present

## 2016-03-03 DIAGNOSIS — G4719 Other hypersomnia: Secondary | ICD-10-CM

## 2016-03-03 DIAGNOSIS — I429 Cardiomyopathy, unspecified: Secondary | ICD-10-CM

## 2016-03-03 DIAGNOSIS — R0683 Snoring: Secondary | ICD-10-CM | POA: Diagnosis not present

## 2016-03-03 DIAGNOSIS — Z0189 Encounter for other specified special examinations: Secondary | ICD-10-CM

## 2016-03-03 MED ORDER — CARVEDILOL 6.25 MG PO TABS: 6.2500 mg | ORAL_TABLET | Freq: Two times a day (BID) | ORAL | Status: DC

## 2016-03-03 NOTE — Patient Instructions (Addendum)
Your physician has recommended you make the following change in your medication.. . 1. START carvedilol 6.25mg  twice daily   Please schedule SLEEP STUDY  Your physician recommends that you schedule a follow-up appointment in: 3 months with Dr. Debara Pickett.

## 2016-03-03 NOTE — Progress Notes (Signed)
OFFICE NOTE  Chief Complaint:  Follow-up catheterization  Primary Care Physician: Stephens Shire, MD  HPI:  Jeffery Nelson is a 70 year old gentleman referred to me for lower extremity edema and shortness of breath. As you know, he is morbidly obese, almost super morbidly obese with a weight of 330 pounds, BMI of 54. He underwent an echocardiogram which demonstrated mild concentric LVH, EF greater than 55%. The left atrium was mildly dilated. However, there were no significant valvular abnormalities. Pulmonary pressures were not elevated. He also underwent lower extremity Dopplers looking for venous insufficiency. There was a small amount of reflux in the right common femoral vein, which is a deep vein. However, the superficial veins were negative for reflux or treatable venous insufficiency. I reviewed the results with the patient today and I feel that his best option is to continue with lower extremity compression stockings and work on weight loss. He is also at high risk for sleep apnea and I have referred him for a sleep study - unfortunately he never made this appointment.  Currently has no other symptoms other than he related a recent bout of shingles that he underwent. This has since resolved and he was not left with any chronic pain.  I saw Jeffery Nelson back in the office today. Overall he is doing fairly well. He had left hip replacement placement recently due to what sound like a spontaneous fracture. Apparently he has of some degree of osteopenia. He was supposed been medicine for that but is not taking it. Fortunately has had about 30-35 pound weight loss and although he never got his foot split-night sleep study, he denies any new apnea symptoms and feels like he is rested during the day. He has been getting a little more shortness of breath mostly when laying down but that is gotten better and he has no new complaints today.  Jeffery Nelson returns today in the office. He reports that  he's recently had some weight gain and lower extremity swelling. He says it is also has some shortness of breath with exertion. Swelling seems to be a little worse in the right leg than the left. He reports she's been compliant with his current dose of Lasix. He is also taking over-the-counter potassium supplements. He denies any chest pain.  I saw Jeffery Nelson back today in follow-up. He reports he still persistently short of breath. I asked him to increase his Lasix and prescribe some potassium however he said he didn't have the money to get potassium and therefore was nervous about taking extra Lasix so he never increased the dose the medicine. In the interim though he's gained about 6 pounds. He does have lower strandy swelling and persistent shortness of breath. His echocardiogram unfortunate shows a new cardiomyopathy with an EF of 20-25% with global hypokinesis which is severe. Previous echo at least a year ago showed an EF which was normal. He denies any chest pain episodes or recent significant viral illnesses that might explain his reduced LV function. I'm surly concerned about coronary artery disease. He also denies any alcohol or drug use.  Jeffery Nelson returns today for follow-up of his heart catheterization, the results are as follows:   Ost 1st Diag to 1st Diag lesion, 50% stenosed.  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 Moderate pulmonary venous hypertension, reduced cardiac index. Elevated PCWP. Will need additional diuresis and medication adjustment for non-ischemic  cardiomyopathy as an outpatient. I recommended increasing his Lasix to 40 mg twice a day. Since that change she's had significant weight loss. Weight today is 258, which is down from 281. He said no, locations from his radial catheterization site. As mentioned, the plan is to titrate his medical  therapy. He reports his shortness of breath is completely resolved and his leg swelling is improved significantly.  PMHx:  Past Medical History  Diagnosis Date  . Shortness of breath   . Dyspnea on exertion   . Lower extremity edema   . Benign neoplasm of colon   . Chest pain     From rib fractures  . Polyneuropathy in diabetes(357.2)   . Psychosexual dysfunction with inhibited sexual excitement   . Restless legs syndrome (RLS)   . Sleep arousal disorder   . Unspecified venous (peripheral) insufficiency   . Type II or unspecified type diabetes mellitus with unspecified complication, not stated as uncontrolled   . Pure hypercholesterolemia   . Hypertension     Past Surgical History  Procedure Laterality Date  . Lower extremity venous doppler  09/04/2012    Mild valvular insufficiency in the R Common Femoral vein w/ 1.6 sec of duration of refliux  . Transthoracic echocardiogram  09/04/2012    EF >55%, normal  . Intramedullary (im) nail intertrochanteric Left 09/21/2014    Procedure: left hip intertroch fracture;  Surgeon: [name], MD;  Location: [place];  Service: Orthopedics;  Laterality: Left;  . Cardiac catheterization N/A 02/12/2016    Procedure: Right/Left Heart Cath and Coronary Angiography;  Surgeon: Pixie Casino, MD;  Location: [facility];  Service: Cardiovascular;  Laterality: N/A;    FAMHx:  Family History  Problem Relation Age of Onset  . Heart disease Mother   . Heart disease Father   . Diabetes Brother   . Heart disease Maternal Grandmother   . Heart disease Maternal Grandfather   . Stroke Paternal Grandmother   . Cancer Paternal Grandfather     Skin cancer    SOCHx:   reports that he has never smoked. He has never used smokeless tobacco. He reports that he does not drink alcohol or use illicit drugs.  ALLERGIES:  Allergies  Allergen Reactions  . Lipitor [Atorvastatin]   . Lyrica [Pregabalin]     ROS: A comprehensive review of systems was  negative except for: Cardiovascular: positive for lower extremity edema  HOME MEDS: Current Outpatient Prescriptions  Medication Sig Dispense Refill  . aspirin EC 81 MG tablet Take 81 mg by mouth daily.    . colchicine 0.6 MG tablet Take 0.6 mg by mouth as needed (for gout).     . fluocinonide cream (LIDEX) [id number] % Apply 1 application topically as needed (rash).    [name] FLUoxetine (PROZAC) 20 MG tablet Take 20 mg by mouth daily.    . furosemide (LASIX) 40 MG tablet Take 1 tablet (40 mg total) by mouth 2 (two) times daily. 60 tablet 6  . HUMALOG MIX 75/25 (75-25) 100 UNIT/ML SUSP injection Inject 55-70 Units as directed daily. Inject 70 units at breakfast and dinner, and 55 units at lunch time  2  . Multiple Vitamin (MULTIVITAMIN WITH MINERALS) TABS tablet Take 1 tablet by mouth daily.    [name] nystatin (MYCOSTATIN/NYSTOP) 100000 UNIT/GM POWD Apply to affected areas in groin twice daily - keep skin dry 60 g 1  . OVER THE COUNTER MEDICATION Take 1 tablet by mouth daily. Calcium 333 mg with Magnesium  133 mg and Zinc 5 mg tablets    . potassium chloride SA (K-DUR,KLOR-CON) 20 MEQ tablet Take 1 tablet (20 mEq total) by mouth as directed. 90 tablet 3  . quinapril (ACCUPRIL) 20 MG tablet Take 20 mg by mouth 2 (two) times daily.    [name] rOPINIRole (REQUIP) 4 MG tablet Take 4 mg by mouth daily.     . rosuvastatin (CRESTOR) 20 MG tablet Take 20 mg by mouth daily.    . carvedilol (COREG) 6.25 MG tablet Take 1 tablet (6.25 mg total) by mouth 2 (two) times daily. 180 tablet 1   No current facility-administered medications for this visit.    LABS/IMAGING: No results found for this or any previous visit (from the past 48 hour(s)). No results found.  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   Jeffery Nelson has a nonischemic cardiomyopathy with mild CAD and recent improvement in his congestion with increasing doses of Lasix. He is on aspirin as well as an ACE inhibitor Accupril, 40 mg daily. I like to add low-dose carvedilol 6.25 mg twice a day for heart failure and coronary artery disease benefit. In addition he is now agreeable to sleep study and will get that scheduled. He may benefit from CPAP therapy.  Follow-up in 3 months.  Pixie Casino, MD, [facility] Attending Cardiologist [place] 03/03/2016, 6:28 PM

## 2016-05-07 ENCOUNTER — Ambulatory Visit (HOSPITAL_BASED_OUTPATIENT_CLINIC_OR_DEPARTMENT_OTHER): Payer: Medicare Other

## 2016-06-03 ENCOUNTER — Encounter: Payer: Self-pay | Admitting: Internal Medicine

## 2016-06-03 ENCOUNTER — Ambulatory Visit (INDEPENDENT_AMBULATORY_CARE_PROVIDER_SITE_OTHER): Payer: Medicare Other | Admitting: Internal Medicine

## 2016-06-03 VITALS — BP 146/84 | HR 66 | Ht 63.0 in | Wt 276.0 lb

## 2016-06-03 DIAGNOSIS — M7989 Other specified soft tissue disorders: Secondary | ICD-10-CM

## 2016-06-03 DIAGNOSIS — R55 Syncope and collapse: Secondary | ICD-10-CM | POA: Diagnosis not present

## 2016-06-03 DIAGNOSIS — I429 Cardiomyopathy, unspecified: Secondary | ICD-10-CM | POA: Diagnosis not present

## 2016-06-03 DIAGNOSIS — Z008 Encounter for other general examination: Secondary | ICD-10-CM

## 2016-06-03 DIAGNOSIS — I10 Essential (primary) hypertension: Secondary | ICD-10-CM | POA: Diagnosis not present

## 2016-06-03 DIAGNOSIS — Z0189 Encounter for other specified special examinations: Secondary | ICD-10-CM

## 2016-06-03 DIAGNOSIS — I428 Other cardiomyopathies: Secondary | ICD-10-CM | POA: Insufficient documentation

## 2016-06-03 HISTORY — DX: Other cardiomyopathies: I42.8

## 2016-06-03 HISTORY — DX: Syncope and collapse: R55

## 2016-06-03 NOTE — Progress Notes (Signed)
OFFICE NOTE  Chief Complaint:  Follow-up catheterization  Primary Care Physician: Stephens Shire, MD  HPI:  Jeffery Nelson is a 70 year old gentleman referred to me for lower extremity edema and shortness of breath. As you know, he is morbidly obese, almost super morbidly obese with a weight of 330 pounds, BMI of 54. He underwent an echocardiogram which demonstrated mild concentric LVH, EF greater than 55%. The left atrium was mildly dilated. However, there were no significant valvular abnormalities. Pulmonary pressures were not elevated. He also underwent lower extremity Dopplers looking for venous insufficiency. There was a small amount of reflux in the right common femoral vein, which is a deep vein. However, the superficial veins were negative for reflux or treatable venous insufficiency. I reviewed the results with the patient today and I feel that his best option is to continue with lower extremity compression stockings and work on weight loss. He is also at high risk for sleep apnea and I have referred him for a sleep study - unfortunately he never made this appointment.  Currently has no other symptoms other than he related a recent bout of shingles that he underwent. This has since resolved and he was not left with any chronic pain.  I saw Jeffery Nelson back in the office today. Overall he is doing fairly well. He had left hip replacement placement recently due to what sound like a spontaneous fracture. Apparently he has of some degree of osteopenia. He was supposed been medicine for that but is not taking it. Fortunately has had about 30-35 pound weight loss and although he never got his foot split-night sleep study, he denies any new apnea symptoms and feels like he is rested during the day. He has been getting a little more shortness of breath mostly when laying down but that is gotten better and he has no new complaints today.  Jeffery Nelson returns today in the office. He reports that  he's recently had some weight gain and lower extremity swelling. He says it is also has some shortness of breath with exertion. Swelling seems to be a little worse in the right leg than the left. He reports she's been compliant with his current dose of Lasix. He is also taking over-the-counter potassium supplements. He denies any chest pain.  I saw Jeffery Nelson back today in follow-up. He reports he still persistently short of breath. I asked him to increase his Lasix and prescribe some potassium however he said he didn't have the money to get potassium and therefore was nervous about taking extra Lasix so he never increased the dose the medicine. In the interim though he's gained about 6 pounds. He does have lower strandy swelling and persistent shortness of breath. His echocardiogram unfortunate shows a new cardiomyopathy with an EF of 20-25% with global hypokinesis which is severe. Previous echo at least a year ago showed an EF which was normal. He denies any chest pain episodes or recent significant viral illnesses that might explain his reduced LV function. I'm surly concerned about coronary artery disease. He also denies any alcohol or drug use.  Jeffery Nelson returns today for follow-up of his heart catheterization, the results are as follows:   Ost 1st Diag to 1st Diag lesion, 50% stenosed.  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 Moderate pulmonary venous hypertension, reduced cardiac index. Elevated PCWP. Will need additional diuresis and medication adjustment for non-ischemic  cardiomyopathy as an outpatient. I recommended increasing his Lasix to 40 mg twice a day. Since that change she's had significant weight loss. Weight today is 258, which is down from 281. He said no, locations from his radial catheterization site. As mentioned, the plan is to titrate his medical  therapy. He reports his shortness of breath is completely resolved and his leg swelling is improved significantly.  06/03/2016  Jeffery Nelson returns today for follow-up. He has gained about 15 pounds. There is some extra edema but mostly this is weight gain not related to heart failure. However he is not as compliant with twice-daily Lasix. He denies any worsening shortness of breath. He had another episode of syncope which was witnessed. He became apparently pale and unresponsive. By the time EMS arrived he was doing well and declined transport to the hospital. He's had 3 episodes in about 6 months. This was the first episode that was witnessed. I'm concerned about a possible arrhythmia.  PMHx:  Past Medical History  Diagnosis Date  . Shortness of breath   . Dyspnea on exertion   . Lower extremity edema   . Benign neoplasm of colon   . Chest pain     From rib fractures  . Polyneuropathy in diabetes(357.2)   . Psychosexual dysfunction with inhibited sexual excitement   . Restless legs syndrome (RLS)   . Sleep arousal disorder   . Unspecified venous (peripheral) insufficiency   . Type II or unspecified type diabetes mellitus with unspecified complication, not stated as uncontrolled   . Pure hypercholesterolemia   . Hypertension     Past Surgical History  Procedure Laterality Date  . Lower extremity venous doppler  09/04/2012    Mild valvular insufficiency in the R Common Femoral vein w/ 1.6 sec of duration of refliux  . Transthoracic echocardiogram  09/04/2012    EF >55%, normal  . Intramedullary (im) nail intertrochanteric Left 09/21/2014    Procedure: left hip intertroch fracture;  Surgeon: [name], MD;  Location: [facility];  Service: Orthopedics;  Laterality: Left;  . Cardiac catheterization N/A 02/12/2016    Procedure: Right/Left Heart Cath and Coronary Angiography;  Surgeon: Pixie Casino, MD;  Location: [facility];  Service: Cardiovascular;  Laterality: N/A;    FAMHx:    Family History  Problem Relation Age of Onset  . Heart disease Mother   . Heart disease Father   . Diabetes Brother   . Heart disease Maternal Grandmother   . Heart disease Maternal Grandfather   . Stroke Paternal Grandmother   . Cancer Paternal Grandfather     Skin cancer    SOCHx:   reports that he has never smoked. He has never used smokeless tobacco. He reports that he does not drink alcohol or use illicit drugs.  ALLERGIES:  Allergies  Allergen Reactions  . Lipitor [Atorvastatin]   . Lyrica [Pregabalin]     ROS: A comprehensive review of systems was negative except for: Cardiovascular: positive for lower extremity edema  HOME MEDS: Current Outpatient Prescriptions  Medication Sig Dispense Refill  . aspirin EC 81 MG tablet Take 81 mg by mouth daily.    . carvedilol (COREG) 6.25 MG tablet Take 1 tablet (6.25 mg total) by mouth 2 (two) times daily. 180 tablet 1  . colchicine 0.6 MG tablet Take 0.6 mg by mouth as needed (for gout).     . fluocinonide cream (LIDEX) AB-123456789 % Apply 1 application topically as needed (rash).    [name]  FLUoxetine (PROZAC) 20 MG tablet Take 20 mg by mouth daily.    . furosemide (LASIX) 40 MG tablet Take 1 tablet (40 mg total) by mouth 2 (two) times daily. 60 tablet 6  . HUMALOG MIX 75/25 (75-25) 100 UNIT/ML SUSP injection Inject 55-70 Units as directed daily. Inject 70 units at breakfast and dinner, and 55 units at lunch time  2  . Multiple Vitamin (MULTIVITAMIN WITH MINERALS) TABS tablet Take 1 tablet by mouth daily.    [name] nystatin (MYCOSTATIN/NYSTOP) 100000 UNIT/GM POWD Apply to affected areas in groin twice daily - keep skin dry 60 g 1  . OVER THE COUNTER MEDICATION Take 1 tablet by mouth daily. Calcium 333 mg with Magnesium 133 mg and Zinc 5 mg tablets    . potassium chloride SA (K-DUR,KLOR-CON) 20 MEQ tablet Take 1 tablet (20 mEq total) by mouth as directed. 90 tablet 3  . quinapril (ACCUPRIL) 20 MG tablet Take 20 mg by mouth 2 (two) times daily.     [name] rOPINIRole (REQUIP) 4 MG tablet Take 4 mg by mouth daily.     . rosuvastatin (CRESTOR) 20 MG tablet Take 20 mg by mouth daily.     No current facility-administered medications for this visit.    LABS/IMAGING: No results found for this or any previous visit (from the past 48 hour(s)). No results found.  VITALS: BP 146/84 mmHg  Pulse 66  Ht 5\' 3"  (1.6 m)  Wt 276 lb (125.193 kg)  BMI 48.90 kg/m2  EXAM: General appearance: alert and no distress Lungs: clear to auscultation bilaterally Heart: regular rate and rhythm, S1, S2 normal, no murmur, click, rub or gallop Extremities: edema 1+ edema Skin: Skin color, texture, turgor normal. No rashes or lesions Neurologic: Grossly normal  EKG: Normal sinus rhythm at 73, LDH  ASSESSMENT: 1. New systolic congestive heart failure with EF 20-25% - nonischemic cardiomyopathy with mild CAD 2. Dyspnea, weight gain, leg swelling 3. Morbid obesity 4. Bilateral deep vein reflux with edema, right greater than left 5. Probable obstructive sleep apnea 6. Recent total L hip arthroplasty 7. Unexplained syncope  PLAN: 1.   Jeffery Nelson has had weight gain and appears somewhat decompensated. I asked him to go back to his 40 mg twice a day Lasix since he's not been compliant with twice-daily dosing. He also needs to watch food intake and work on some walking and exercise to help lose weight. In addition he still has not had a sleep study, which was due to the fact that he had to reschedule because of an upper respiratory infection. He plans to get that within the next month. I'm concerned about his unexplained syncopal episodes. He's had several over the past 6 months but they're not very easy to predict and I'm concerned they may be related to ventricular arrhythmias in the setting of a low EF. I like for him to have a loop recorder implanted to see if we can detect these episodes and if they're caused by a ventricular arrhythmia he will likely need a  referral for AICD.  Follow-up in 3 months.  Pixie Casino, MD, [facility] Attending Cardiologist [place] 06/03/2016, 1:28 PM

## 2016-06-03 NOTE — Patient Instructions (Signed)
Dr. Debara Pickett has ordered a LOOP RECORDER - this is a hospital procedure done at [facility] by a cardiologist.  (possible arrange w/Dr. Allegra Lai)  Your physician recommends that you schedule a follow-up appointment in: [place] with Dr. Debara Pickett

## 2016-06-07 ENCOUNTER — Telehealth: Payer: Self-pay | Admitting: Internal Medicine

## 2016-06-07 NOTE — Telephone Encounter (Signed)
Follow-up ° ° ° ° °The pt is returning the nurses call °

## 2016-06-07 NOTE — Telephone Encounter (Signed)
Spoke with pt, he was returning a call to [employer]. Pt aware of loop recorder placement with dr gregg taylor md. Patient voiced understanding to go to [facility] @ 6:30 am the day of his procedure

## 2016-06-11 ENCOUNTER — Telehealth: Payer: Self-pay | Admitting: Cardiology

## 2016-06-11 NOTE — Telephone Encounter (Signed)
New message      Pt is due to have a procedure on 06-17-16.  His paperwork says he needs to have labs drawn 7 days prior and he has not had this done.  Please call

## 2016-06-11 NOTE — Telephone Encounter (Signed)
Informed patient that he does not need pre procedure labs for a LINQ implant. He understands that he may also eat breakfast if desired. Pt thanks me for calling.

## 2016-06-17 ENCOUNTER — Ambulatory Visit (HOSPITAL_BASED_OUTPATIENT_CLINIC_OR_DEPARTMENT_OTHER): Payer: Medicare Other

## 2016-06-17 ENCOUNTER — Ambulatory Visit (HOSPITAL_COMMUNITY)
Admission: RE | Admit: 2016-06-17 | Discharge: 2016-06-17 | Disposition: A | Discharge status: Home / Self Care | Payer: Medicare Other | Source: Ambulatory Visit | Attending: Cardiology | Admitting: Cardiology

## 2016-06-17 ENCOUNTER — Encounter (HOSPITAL_COMMUNITY)
Admission: RE | Disposition: A | Discharge status: Home / Self Care | Payer: Self-pay | Source: Ambulatory Visit | Attending: Cardiology

## 2016-06-17 DIAGNOSIS — B029 Zoster without complications: Secondary | ICD-10-CM | POA: Diagnosis not present

## 2016-06-17 DIAGNOSIS — Z538 Procedure and treatment not carried out for other reasons: Secondary | ICD-10-CM | POA: Insufficient documentation

## 2016-06-17 DIAGNOSIS — Z6841 Body Mass Index (BMI) 40.0 and over, adult: Secondary | ICD-10-CM | POA: Diagnosis not present

## 2016-06-17 DIAGNOSIS — Z7982 Long term (current) use of aspirin: Secondary | ICD-10-CM | POA: Diagnosis not present

## 2016-06-17 DIAGNOSIS — I429 Cardiomyopathy, unspecified: Secondary | ICD-10-CM | POA: Insufficient documentation

## 2016-06-17 DIAGNOSIS — Z96642 Presence of left artificial hip joint: Secondary | ICD-10-CM | POA: Diagnosis not present

## 2016-06-17 DIAGNOSIS — Z8249 Family history of ischemic heart disease and other diseases of the circulatory system: Secondary | ICD-10-CM | POA: Diagnosis not present

## 2016-06-17 DIAGNOSIS — E785 Hyperlipidemia, unspecified: Secondary | ICD-10-CM | POA: Diagnosis not present

## 2016-06-17 DIAGNOSIS — M8580 Other specified disorders of bone density and structure, unspecified site: Secondary | ICD-10-CM | POA: Insufficient documentation

## 2016-06-17 DIAGNOSIS — E7800 Pure hypercholesterolemia, unspecified: Secondary | ICD-10-CM | POA: Insufficient documentation

## 2016-06-17 DIAGNOSIS — G2581 Restless legs syndrome: Secondary | ICD-10-CM | POA: Insufficient documentation

## 2016-06-17 DIAGNOSIS — R55 Syncope and collapse: Secondary | ICD-10-CM | POA: Insufficient documentation

## 2016-06-17 DIAGNOSIS — E1142 Type 2 diabetes mellitus with diabetic polyneuropathy: Secondary | ICD-10-CM | POA: Diagnosis not present

## 2016-06-17 DIAGNOSIS — K219 Gastro-esophageal reflux disease without esophagitis: Secondary | ICD-10-CM | POA: Insufficient documentation

## 2016-06-17 DIAGNOSIS — I110 Hypertensive heart disease with heart failure: Secondary | ICD-10-CM | POA: Insufficient documentation

## 2016-06-17 DIAGNOSIS — I272 Other secondary pulmonary hypertension: Secondary | ICD-10-CM | POA: Diagnosis not present

## 2016-06-17 LAB — ECHOCARDIOGRAM COMPLETE
CHL CUP DOP CALC LVOT VTI: 20.3 cm
E/e' ratio: 10.38
EWDT: 162 ms
FS: 13 % — AB (ref 28–44)
HEIGHTINCHES: 63 in
IVS/LV PW RATIO, ED: 1.09
LA ID, A-P, ES: 52 mm
LA diam index: 2.11 cm/m2
LA vol index: 30.2 mL/m2
LAVOL: 74.5 mL
LAVOLA4C: 100 mL
LDCA: 3.46 cm2
LEFT ATRIUM END SYS DIAM: 52 mm
LV E/e' medial: 10.38
LV E/e'average: 10.38
LV TDI E'LATERAL: 8.49
LV dias vol index: 68 mL/m2
LV e' LATERAL: 8.49 cm/s
LVDIAVOL: 169 mL — AB (ref 62–150)
LVOT SV: 70 mL
LVOTD: 21 mm
LVOTPV: 85.8 cm/s
LVSYSVOL: 104 mL — AB (ref 21–61)
LVSYSVOLIN: 42 mL/m2
MV Dec: 162
MV Peak grad: 3 mmHg
MV pk E vel: 88.1 m/s
MVPKAVEL: 112 m/s
PW: 11.3 mm — AB (ref 0.6–1.1)
RV TAPSE: 26.1 mm
Reg peak vel: 305 cm/s
Simpson's disk: 39
Stroke v: 65 ml
TDI e' medial: 4.9
TR max vel: 305 cm/s
WEIGHTICAEL: 4528 [oz_av]

## 2016-06-17 LAB — GLUCOSE, CAPILLARY: GLUCOSE-CAPILLARY: 129 mg/dL — AB (ref 65–99)

## 2016-06-17 SURGERY — INVASIVE LAB ABORTED CASE

## 2016-06-17 MED ORDER — LIDOCAINE-EPINEPHRINE 1 %-1:100000 IJ SOLN
INTRAMUSCULAR | Status: AC
  Filled 2016-06-17: qty 1

## 2016-06-17 SURGICAL SUPPLY — 1 items: PACK LOOP INSERTION (CUSTOM PROCEDURE TRAY) IMPLANT

## 2016-06-17 NOTE — H&P (Addendum)
HPI:  Jeffery Nelson is a 70 year old gentleman referred to me for lower extremity edema and shortness of breath. As you know, he is morbidly obese, almost super morbidly obese with a weight of 330 pounds, BMI of 54. He underwent an echocardiogram which demonstrated mild concentric LVH, EF greater than 55%. The left atrium was mildly dilated. However, there were no significant valvular abnormalities. Pulmonary pressures were not elevated. He also underwent lower extremity Dopplers looking for venous insufficiency. There was a small amount of reflux in the right common femoral vein, which is a deep vein. However, the superficial veins were negative for reflux or treatable venous insufficiency. I reviewed the results with the patient today and I feel that his best option is to continue with lower extremity compression stockings and work on weight loss. He is also at high risk for sleep apnea and I have referred him for a sleep study - unfortunately he never made this appointment. Currently has no other symptoms other than he related a recent bout of shingles that he underwent. This has since resolved and he was not left with any chronic pain.  I saw Jeffery Nelson back in the office today. Overall he is doing fairly well. He had left hip replacement placement recently due to what sound like a spontaneous fracture. Apparently he has of some degree of osteopenia. He was supposed been medicine for that but is not taking it. Fortunately has had about 30-35 pound weight loss and although he never got his foot split-night sleep study, he denies any new apnea symptoms and feels like he is rested during the day. He has been getting a little more shortness of breath mostly when laying down but that is gotten better and he has no new complaints today.  Jeffery Nelson returns today in the office. He reports that he's recently had some weight gain and lower extremity swelling. He says it is also has some shortness of breath with  exertion. Swelling seems to be a little worse in the right leg than the left. He reports she's been compliant with his current dose of Lasix. He is also taking over-the-counter potassium supplements. He denies any chest pain.  I saw Jeffery Nelson back today in follow-up. He reports he still persistently short of breath. I asked him to increase his Lasix and prescribe some potassium however he said he didn't have the money to get potassium and therefore was nervous about taking extra Lasix so he never increased the dose the medicine. In the interim though he's gained about 6 pounds. He does have lower strandy swelling and persistent shortness of breath. His echocardiogram unfortunate shows a new cardiomyopathy with an EF of 20-25% with global hypokinesis which is severe. Previous echo at least a year ago showed an EF which was normal. He denies any chest pain episodes or recent significant viral illnesses that might explain his reduced LV function. I'm surly concerned about coronary artery disease. He also denies any alcohol or drug use.  Jeffery Nelson returns today for follow-up of his heart catheterization, the results are as follows:   Ost 1st Diag to 1st Diag lesion, 50% stenosed.  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 Moderate pulmonary venous hypertension, reduced cardiac index. Elevated PCWP. Will need additional diuresis and medication adjustment for non-ischemic cardiomyopathy as an outpatient. I recommended increasing his Lasix to 40 mg twice a day. Since that change she's  had significant weight loss. Weight today is 258, which is down from 281. He said no, locations from his radial catheterization site. As mentioned, the plan is to titrate his medical therapy. He reports his shortness of breath is completely resolved and his leg swelling is improved  significantly.  06/03/2016  Jeffery Nelson returns today for follow-up. He has gained about 15 pounds. There is some extra edema but mostly this is weight gain not related to heart failure. However he is not as compliant with twice-daily Lasix. He denies any worsening shortness of breath. He had another episode of syncope which was witnessed. He became apparently pale and unresponsive. By the time EMS arrived he was doing well and declined transport to the hospital. He's had 3 episodes in about 6 months. This was the first episode that was witnessed. I'm concerned about a possible arrhythmia.  PMHx:  Past Medical History  Diagnosis Date  . Shortness of breath   . Dyspnea on exertion   . Lower extremity edema   . Benign neoplasm of colon   . Chest pain     From rib fractures  . Polyneuropathy in diabetes(357.2)   . Psychosexual dysfunction with inhibited sexual excitement   . Restless legs syndrome (RLS)   . Sleep arousal disorder   . Unspecified venous (peripheral) insufficiency   . Type II or unspecified type diabetes mellitus with unspecified complication, not stated as uncontrolled   . Pure hypercholesterolemia   . Hypertension     Past Surgical History  Procedure Laterality Date  . Lower extremity venous doppler  09/04/2012    Mild valvular insufficiency in the R Common Femoral vein w/ 1.6 sec of duration of refliux  . Transthoracic echocardiogram  09/04/2012    EF >55%, normal  . Intramedullary (im) nail intertrochanteric Left 09/21/2014    Procedure: left hip intertroch fracture; Surgeon: [name], MD; Location: [facility]; Service: Orthopedics; Laterality: Left;  . Cardiac catheterization N/A 02/12/2016    Procedure: Right/Left Heart Cath and Coronary Angiography; Surgeon: Pixie Casino, MD; Location: [facility]; Service: Cardiovascular; Laterality: N/A;    FAMHx:  Family History   Problem Relation Age of Onset  . Heart disease Mother   . Heart disease Father   . Diabetes Brother   . Heart disease Maternal Grandmother   . Heart disease Maternal Grandfather   . Stroke Paternal Grandmother   . Cancer Paternal Grandfather     Skin cancer    SOCHx:   reports that he has never smoked. He has never used smokeless tobacco. He reports that he does not drink alcohol or use illicit drugs.  ALLERGIES:  Allergies  Allergen Reactions  . Lipitor [Atorvastatin]   . Lyrica [Pregabalin]     ROS: A comprehensive review of systems was negative except for: Cardiovascular: positive for lower extremity edema  HOME MEDS: Current Outpatient Prescriptions  Medication Sig Dispense Refill  . aspirin EC 81 MG tablet Take 81 mg by mouth daily.    . carvedilol (COREG) 6.25 MG tablet Take 1 tablet (6.25 mg total) by mouth 2 (two) times daily. 180 tablet 1  . colchicine 0.6 MG tablet Take 0.6 mg by mouth as needed (for gout).     . fluocinonide cream (LIDEX) AB-123456789 % Apply 1 application topically as needed (rash).    [name] FLUoxetine (PROZAC) 20 MG tablet Take 20 mg by mouth daily.    . furosemide (LASIX) 40 MG tablet Take 1 tablet (40 mg total) by mouth  2 (two) times daily. 60 tablet 6  . HUMALOG MIX 75/25 (75-25) 100 UNIT/ML SUSP injection Inject 55-70 Units as directed daily. Inject 70 units at breakfast and dinner, and 55 units at lunch time  2  . Multiple Vitamin (MULTIVITAMIN WITH MINERALS) TABS tablet Take 1 tablet by mouth daily.    [name] nystatin (MYCOSTATIN/NYSTOP) 100000 UNIT/GM POWD Apply to affected areas in groin twice daily - keep skin dry 60 g 1  . OVER THE COUNTER MEDICATION Take 1 tablet by mouth daily. Calcium 333 mg with Magnesium 133 mg and Zinc 5 mg tablets    . potassium chloride SA (K-DUR,KLOR-CON) 20 MEQ tablet Take 1 tablet (20 mEq total) by mouth as directed. 90 tablet  3  . quinapril (ACCUPRIL) 20 MG tablet Take 20 mg by mouth 2 (two) times daily.    [name] rOPINIRole (REQUIP) 4 MG tablet Take 4 mg by mouth daily.     . rosuvastatin (CRESTOR) 20 MG tablet Take 20 mg by mouth daily.     No current facility-administered medications for this visit.    LABS/IMAGING:  Lab Results Last 48 Hours    No results found for this or any previous visit (from the past 48 hour(s)).    Imaging Results (Last 48 hours)    No results found.    VITALS: BP 146/84 mmHg  Pulse 66  Ht 5\' 3"  (1.6 m)  Wt 276 lb (125.193 kg)  BMI 48.90 kg/m2  EXAM: General appearance: alert and no distress Lungs: clear to auscultation bilaterally Heart: regular rate and rhythm, S1, S2 normal, no murmur, click, rub or gallop Extremities: edema 1+ edema Skin: Skin color, texture, turgor normal. No rashes or lesions Neurologic: Grossly normal  EKG: Normal sinus rhythm at 73, LDH  ASSESSMENT: 1. New systolic congestive heart failure with EF 20-25% - nonischemic cardiomyopathy with mild CAD 2. Dyspnea, weight gain, leg swelling 3. Morbid obesity 4. Bilateral deep vein reflux with edema, right greater than left 5. Probable obstructive sleep apnea 6. Recent total L hip arthroplasty 7. Unexplained syncope  PLAN: 1. Mr. Deleone has had weight gain and appears somewhat decompensated. I asked him to go back to his 40 mg twice a day Lasix since he's not been compliant with twice-daily dosing. He also needs to watch food intake and work on some walking and exercise to help lose weight. In addition he still has not had a sleep study, which was due to the fact that he had to reschedule because of an upper respiratory infection. He plans to get that within the next month. I'm concerned about his unexplained syncopal episodes. He's had several over the past 6 months but they're not very easy to predict and I'm concerned they may be related to ventricular arrhythmias in the setting of  a low EF. I like for him to have a loop recorder implanted to see if we can detect these episodes and if they're caused by a ventricular arrhythmia he will likely need a referral for AICD.       EP Attending  Patient seen and examined. The patient was referred today to have an ILR placed. He has severe LV dysfunction and is at high risk for malignant ventricular arrhythmias in the setting of a non-ischemic CM, EF 25%, and syncope. If his EF remains low, ICD insertion would be indicated and ILR insertion would not. If his EF has normalized, then ILR insertion would be reasonable. He is already on maximal medical therapy for CHF.  Mikle Bosworth.D.

## 2016-06-17 NOTE — Discharge Instructions (Signed)
Pt given verbal and written instructions and verbalizes understanding

## 2016-06-17 NOTE — Progress Notes (Signed)
  Echocardiogram 2D Echocardiogram has been performed.  Jeffery Nelson M 06/17/2016, 9:00 AM

## 2016-06-18 MED FILL — Lidocaine Inj 1% w/ Epinephrine-1:100000: INTRAMUSCULAR | Qty: 20 | Status: AC

## 2016-06-21 ENCOUNTER — Ambulatory Visit (HOSPITAL_COMMUNITY)
Admission: RE | Admit: 2016-06-21 | Discharge: 2016-06-22 | Disposition: A | Discharge status: Home / Self Care | Payer: Medicare Other | Source: Ambulatory Visit | Attending: Internal Medicine | Admitting: Internal Medicine

## 2016-06-21 ENCOUNTER — Encounter (HOSPITAL_COMMUNITY): Payer: Self-pay | Admitting: Internal Medicine

## 2016-06-21 ENCOUNTER — Encounter (HOSPITAL_COMMUNITY)
Admission: RE | Disposition: A | Discharge status: Home / Self Care | Payer: Self-pay | Source: Ambulatory Visit | Attending: Internal Medicine

## 2016-06-21 DIAGNOSIS — Z7982 Long term (current) use of aspirin: Secondary | ICD-10-CM | POA: Insufficient documentation

## 2016-06-21 DIAGNOSIS — I110 Hypertensive heart disease with heart failure: Secondary | ICD-10-CM | POA: Insufficient documentation

## 2016-06-21 DIAGNOSIS — I429 Cardiomyopathy, unspecified: Secondary | ICD-10-CM

## 2016-06-21 DIAGNOSIS — B029 Zoster without complications: Secondary | ICD-10-CM | POA: Diagnosis not present

## 2016-06-21 DIAGNOSIS — R55 Syncope and collapse: Secondary | ICD-10-CM | POA: Insufficient documentation

## 2016-06-21 DIAGNOSIS — Z823 Family history of stroke: Secondary | ICD-10-CM | POA: Diagnosis not present

## 2016-06-21 DIAGNOSIS — Z96642 Presence of left artificial hip joint: Secondary | ICD-10-CM | POA: Insufficient documentation

## 2016-06-21 DIAGNOSIS — I2510 Atherosclerotic heart disease of native coronary artery without angina pectoris: Secondary | ICD-10-CM | POA: Insufficient documentation

## 2016-06-21 DIAGNOSIS — I5022 Chronic systolic (congestive) heart failure: Secondary | ICD-10-CM | POA: Insufficient documentation

## 2016-06-21 DIAGNOSIS — I5023 Acute on chronic systolic (congestive) heart failure: Secondary | ICD-10-CM | POA: Diagnosis present

## 2016-06-21 DIAGNOSIS — Z794 Long term (current) use of insulin: Secondary | ICD-10-CM | POA: Diagnosis not present

## 2016-06-21 DIAGNOSIS — I878 Other specified disorders of veins: Secondary | ICD-10-CM | POA: Diagnosis not present

## 2016-06-21 DIAGNOSIS — E785 Hyperlipidemia, unspecified: Secondary | ICD-10-CM | POA: Insufficient documentation

## 2016-06-21 DIAGNOSIS — G2581 Restless legs syndrome: Secondary | ICD-10-CM | POA: Insufficient documentation

## 2016-06-21 DIAGNOSIS — M8580 Other specified disorders of bone density and structure, unspecified site: Secondary | ICD-10-CM | POA: Insufficient documentation

## 2016-06-21 DIAGNOSIS — Z8249 Family history of ischemic heart disease and other diseases of the circulatory system: Secondary | ICD-10-CM | POA: Diagnosis not present

## 2016-06-21 DIAGNOSIS — Z6841 Body Mass Index (BMI) 40.0 and over, adult: Secondary | ICD-10-CM | POA: Insufficient documentation

## 2016-06-21 DIAGNOSIS — I428 Other cardiomyopathies: Secondary | ICD-10-CM | POA: Diagnosis not present

## 2016-06-21 DIAGNOSIS — E1142 Type 2 diabetes mellitus with diabetic polyneuropathy: Secondary | ICD-10-CM | POA: Diagnosis not present

## 2016-06-21 DIAGNOSIS — E7800 Pure hypercholesterolemia, unspecified: Secondary | ICD-10-CM | POA: Diagnosis not present

## 2016-06-21 HISTORY — DX: Type 2 diabetes mellitus without complications: E11.9

## 2016-06-21 HISTORY — DX: Sleep apnea, unspecified: G47.30

## 2016-06-21 HISTORY — PX: EP IMPLANTABLE DEVICE: SHX172B

## 2016-06-21 HISTORY — DX: Gout, unspecified: M10.9

## 2016-06-21 HISTORY — DX: Acute on chronic systolic (congestive) heart failure: I50.23

## 2016-06-21 HISTORY — DX: Presence of automatic (implantable) cardiac defibrillator: Z95.810

## 2016-06-21 LAB — BASIC METABOLIC PANEL
Anion gap: 8 (ref 5–15)
BUN: 22 mg/dL — AB (ref 6–20)
CALCIUM: 8.7 mg/dL — AB (ref 8.9–10.3)
CO2: 27 mmol/L (ref 22–32)
Chloride: 105 mmol/L (ref 101–111)
Creatinine, Ser: 0.74 mg/dL (ref 0.61–1.24)
GFR calc Af Amer: >60 mL/min (ref >60)
GLUCOSE: 195 mg/dL — AB (ref 65–99)
Potassium: 4.4 mmol/L (ref 3.5–5.1)
SODIUM: 140 mmol/L (ref 135–145)

## 2016-06-21 LAB — CBC
HCT: 40.5 % (ref 39.0–52.0)
Hemoglobin: 13 g/dL (ref 13.0–17.0)
MCH: 28 pg (ref 26.0–34.0)
MCHC: 32.1 g/dL (ref 30.0–36.0)
MCV: 87.3 fL (ref 78.0–100.0)
PLATELETS: 146 10*3/uL — AB (ref 150–400)
RBC: 4.64 MIL/uL (ref 4.22–5.81)
RDW: 14.7 % (ref 11.5–15.5)
WBC: 6.4 10*3/uL (ref 4.0–10.5)

## 2016-06-21 LAB — GLUCOSE, CAPILLARY
GLUCOSE-CAPILLARY: 175 mg/dL — AB (ref 65–99)
Glucose-Capillary: 214 mg/dL — ABNORMAL HIGH (ref 65–99)
Glucose-Capillary: 164 mg/dL — ABNORMAL HIGH (ref 65–99)

## 2016-06-21 LAB — SURGICAL PCR SCREEN
MRSA, PCR: POSITIVE — AB
Staphylococcus aureus: POSITIVE — AB

## 2016-06-21 SURGERY — ICD IMPLANT

## 2016-06-21 MED ORDER — LIDOCAINE HCL (PF) 1 % IJ SOLN
INTRAMUSCULAR | Status: AC
  Filled 2016-06-21: qty 30

## 2016-06-21 MED ORDER — MUPIROCIN 2 % EX OINT
1.0000 "application " | TOPICAL_OINTMENT | Freq: Two times a day (BID) | CUTANEOUS | Status: DC
  Administered 2016-06-21 – 2016-06-22 (×3): 1 via NASAL
  Filled 2016-06-21: qty 22

## 2016-06-21 MED ORDER — LIDOCAINE HCL (PF) 1 % IJ SOLN
INTRAMUSCULAR | Status: DC | PRN
  Administered 2016-06-21: 55 mL

## 2016-06-21 MED ORDER — HEPARIN (PORCINE) IN NACL 2-0.9 UNIT/ML-% IJ SOLN
INTRAMUSCULAR | Status: AC
  Filled 2016-06-21: qty 500

## 2016-06-21 MED ORDER — MUPIROCIN 2 % EX OINT
TOPICAL_OINTMENT | CUTANEOUS | Status: AC
  Filled 2016-06-21: qty 22

## 2016-06-21 MED ORDER — ONDANSETRON HCL 4 MG/2ML IJ SOLN: 4.0000 mg | Freq: Four times a day (QID) | INTRAMUSCULAR | Status: DC | PRN

## 2016-06-21 MED ORDER — HEPARIN (PORCINE) IN NACL 2-0.9 UNIT/ML-% IJ SOLN
INTRAMUSCULAR | Status: DC | PRN
  Administered 2016-06-21: 13:00:00

## 2016-06-21 MED ORDER — FUROSEMIDE 40 MG PO TABS
40.0000 mg | ORAL_TABLET | Freq: Two times a day (BID) | ORAL | Status: DC
  Administered 2016-06-21 – 2016-06-22 (×2): 40 mg via ORAL
  Filled 2016-06-21 (×2): qty 1

## 2016-06-21 MED ORDER — CHLORHEXIDINE GLUCONATE CLOTH 2 % EX PADS: 6.0000 | MEDICATED_PAD | Freq: Every day | CUTANEOUS | Status: DC

## 2016-06-21 MED ORDER — SODIUM CHLORIDE 0.9 % IV SOLN
INTRAVENOUS | Status: DC
  Administered 2016-06-21: 10:00:00 via INTRAVENOUS

## 2016-06-21 MED ORDER — POTASSIUM CHLORIDE CRYS ER 20 MEQ PO TBCR
20.0000 meq | EXTENDED_RELEASE_TABLET | Freq: Two times a day (BID) | ORAL | Status: DC
  Administered 2016-06-21 – 2016-06-22 (×2): 20 meq via ORAL
  Filled 2016-06-21 (×2): qty 1

## 2016-06-21 MED ORDER — CHLORHEXIDINE GLUCONATE 4 % EX LIQD
60.0000 mL | Freq: Once | CUTANEOUS | Status: DC
Start: 2016-06-21 — End: 2016-06-21
  Filled 2016-06-21: qty 60

## 2016-06-21 MED ORDER — DEXTROSE 5 % IV SOLN
3.0000 g | INTRAVENOUS | Status: AC
  Administered 2016-06-21: 3 g via INTRAVENOUS
  Filled 2016-06-21: qty 3000

## 2016-06-21 MED ORDER — INSULIN ASPART 100 UNIT/ML ~~LOC~~ SOLN
0.0000 [IU] | Freq: Three times a day (TID) | SUBCUTANEOUS | Status: DC
  Administered 2016-06-22: 2 [IU] via SUBCUTANEOUS

## 2016-06-21 MED ORDER — ACETAMINOPHEN 325 MG PO TABS: 325.0000 mg | ORAL_TABLET | ORAL | Status: DC | PRN

## 2016-06-21 MED ORDER — INSULIN ASPART PROT & ASPART (70-30 MIX) 100 UNIT/ML ~~LOC~~ SUSP
50.0000 [IU] | Freq: Two times a day (BID) | SUBCUTANEOUS | Status: DC
Start: 2016-06-21 — End: 2016-06-22
  Administered 2016-06-21 – 2016-06-22 (×2): 50 [IU] via SUBCUTANEOUS
  Filled 2016-06-21: qty 10

## 2016-06-21 MED ORDER — CARVEDILOL 6.25 MG PO TABS
6.2500 mg | ORAL_TABLET | Freq: Two times a day (BID) | ORAL | Status: DC
  Administered 2016-06-21 – 2016-06-22 (×2): 6.25 mg via ORAL
  Filled 2016-06-21 (×2): qty 1

## 2016-06-21 MED ORDER — FENTANYL CITRATE (PF) 100 MCG/2ML IJ SOLN
INTRAMUSCULAR | Status: DC | PRN
  Administered 2016-06-21: 12.5 ug via INTRAVENOUS
  Administered 2016-06-21: 25 ug via INTRAVENOUS

## 2016-06-21 MED ORDER — FENTANYL CITRATE (PF) 100 MCG/2ML IJ SOLN
INTRAMUSCULAR | Status: AC
  Filled 2016-06-21: qty 2

## 2016-06-21 MED ORDER — IOPAMIDOL (ISOVUE-370) INJECTION 76%
INTRAVENOUS | Status: DC | PRN
  Administered 2016-06-21: 10 mL via INTRAVENOUS

## 2016-06-21 MED ORDER — ROPINIROLE HCL 1 MG PO TABS
4.0000 mg | ORAL_TABLET | Freq: Every day | ORAL | Status: DC
  Administered 2016-06-21 – 2016-06-22 (×2): 4 mg via ORAL
  Filled 2016-06-21 (×2): qty 4

## 2016-06-21 MED ORDER — MIDAZOLAM HCL 5 MG/5ML IJ SOLN
INTRAMUSCULAR | Status: AC
  Filled 2016-06-21: qty 5

## 2016-06-21 MED ORDER — SODIUM CHLORIDE 0.9 % IR SOLN
Status: AC
  Filled 2016-06-21: qty 2

## 2016-06-21 MED ORDER — FLUOXETINE HCL 20 MG PO CAPS
20.0000 mg | ORAL_CAPSULE | Freq: Every day | ORAL | Status: DC
  Administered 2016-06-22: 20 mg via ORAL
  Filled 2016-06-21 (×2): qty 1

## 2016-06-21 MED ORDER — MIDAZOLAM HCL 5 MG/5ML IJ SOLN
INTRAMUSCULAR | Status: DC | PRN
  Administered 2016-06-21 (×3): 1 mg via INTRAVENOUS

## 2016-06-21 MED ORDER — IOPAMIDOL (ISOVUE-370) INJECTION 76%
INTRAVENOUS | Status: AC
  Filled 2016-06-21: qty 50

## 2016-06-21 MED ORDER — COLCHICINE 0.6 MG PO TABS: 0.6000 mg | ORAL_TABLET | Freq: Every day | ORAL | Status: DC | PRN

## 2016-06-21 MED ORDER — SODIUM CHLORIDE 0.9 % IR SOLN
80.0000 mg | Status: AC
  Administered 2016-06-21: 80 mg

## 2016-06-21 MED ORDER — CEFAZOLIN IN D5W 1 GM/50ML IV SOLN
1.0000 g | Freq: Four times a day (QID) | INTRAVENOUS | Status: AC
  Administered 2016-06-21 – 2016-06-22 (×3): 1 g via INTRAVENOUS
  Filled 2016-06-21 (×4): qty 50

## 2016-06-21 MED ORDER — ASPIRIN EC 81 MG PO TBEC
81.0000 mg | DELAYED_RELEASE_TABLET | Freq: Every day | ORAL | Status: DC
  Administered 2016-06-22: 81 mg via ORAL
  Filled 2016-06-21: qty 1

## 2016-06-21 MED ORDER — MUPIROCIN 2 % EX OINT
1.0000 "application " | TOPICAL_OINTMENT | Freq: Once | CUTANEOUS | Status: AC
  Administered 2016-06-21: 1 via TOPICAL
  Filled 2016-06-21: qty 22

## 2016-06-21 MED ORDER — QUINAPRIL HCL 10 MG PO TABS
20.0000 mg | ORAL_TABLET | Freq: Two times a day (BID) | ORAL | Status: DC
  Administered 2016-06-21 – 2016-06-22 (×2): 20 mg via ORAL
  Filled 2016-06-21 (×2): qty 2

## 2016-06-21 SURGICAL SUPPLY — 6 items
CABLE SURGICAL S-101-97-12 (CABLE) ×3 IMPLANT
ICD ELLIPSE VR CD1411-36C (ICD Generator) ×3 IMPLANT
LEAD DURATA 7122-65CM (Lead) ×3 IMPLANT
PAD DEFIB LIFELINK (PAD) ×3 IMPLANT
SHEATH CLASSIC 7F (SHEATH) ×6 IMPLANT
TRAY PACEMAKER INSERTION (PACKS) ×3 IMPLANT

## 2016-06-21 NOTE — Interval H&P Note (Signed)
History and Physical Interval Note:  06/21/2016 10:34 AM  [name]  has presented today for surgery, with the diagnosis of hf  The various methods of treatment have been discussed with the patient and family. After consideration of risks, benefits and other options for treatment, the patient has consented to  Procedure(s): ICD Implant (N/A) as a surgical intervention .  The patient's history has been reviewed, patient examined, no change in status, stable for surgery.  I have reviewed the patient's chart and labs.  Questions were answered to the patient's satisfaction.     Cristopher Peru

## 2016-06-21 NOTE — Discharge Instructions (Signed)
° ° °  Supplemental Discharge Instructions for  Pacemaker/Defibrillator Patients  Activity No heavy lifting or vigorous activity with your left/right arm for 6 to 8 weeks.  Do not raise your left/right arm above your head for one week.  Gradually raise your affected arm as drawn below.           __        06/25/16                        06/26/16                      06/27/16                    06/28/16  NO DRIVING for 6 months post syncopal event   WOUND CARE - Keep the wound area clean and dry.  Do not get this area wet for one week. No showers for one week; you may shower on  06/28/16   . - The tape/steri-strips on your wound will fall off; do not pull them off.  No bandage is needed on the site.  DO  NOT apply any creams, oils, or ointments to the wound area. - If you notice any drainage or discharge from the wound, any swelling or bruising at the site, or you develop a fever > 101? F after you are discharged home, call the office at once.  Special Instructions - You are still able to use cellular telephones; use the ear opposite the side where you have your pacemaker/defibrillator.  Avoid carrying your cellular phone near your device. - When traveling through airports, show security personnel your identification card to avoid being screened in the metal detectors.  Ask the security personnel to use the hand wand. - Avoid arc welding equipment, MRI testing (magnetic resonance imaging), TENS units (transcutaneous nerve stimulators).  Call the office for questions about other devices. - Avoid electrical appliances that are in poor condition or are not properly grounded. - Microwave ovens are safe to be near or to operate.  Additional information for defibrillator patients should your device go off: - If your device goes off ONCE and you feel fine afterward, notify the device clinic nurses. - If your device goes off ONCE and you do not feel well afterward, call 911. - If your device goes off TWICE,  call 911. - If your device goes off THREE times in one day, call 911.  DO NOT DRIVE YOURSELF OR A FAMILY MEMBER WITH A DEFIBRILLATOR TO THE HOSPITAL--CALL 911.

## 2016-06-21 NOTE — Discharge Summary (Signed)
ELECTROPHYSIOLOGY PROCEDURE DISCHARGE SUMMARY    Patient ID: Jeffery Nelson,  MRN: [id number], DOB/AGE: 21-Apr-1946 70 y.o.  Admit date: 06/21/2016 Discharge date: 06/21/2016  Primary Care Physician: Stephens Shire, MD Primary Cardiologist: [facility] Electrophysiologist: Lovena Le  Primary Discharge Diagnosis:  NICM, EF 35%, chronic class 2 CHF, and syncope s/p ICD implant this admission  Secondary Discharge Diagnosis:  1.  Diabetes 2.  Hypertension 3.  Hyperlipidemia 4.  Restless leg syndrome 5.  Chronic systolic heart failure  Allergies  Allergen Reactions  . Lipitor [Atorvastatin] Swelling  . Lyrica [Pregabalin] Swelling     Procedures This Admission:  1.  Implantation of a STJ single chamber ICD on 06/21/16 by Dr Lovena Le.  See op note for full details.  There were no immediate post procedure complications. 2.  CXR on 06/22/16 demonstrated no pneumothorax status post device implantation.   Brief HPI: Jeffery Nelson is a 70 y.o. male was referred to electrophysiology in the outpatient setting for consideration of ICD implantation.  Past medical history includes NICM and syncope.  The patient has persistent LV dysfunction despite guideline directed therapy.  Risks, benefits, and alternatives to ICD implantation were reviewed with the patient who wished to proceed.   Hospital Course:  The patient was admitted and underwent implantation of a STJ single chamber ICD with details as outlined above. He was monitored on telemetry overnight which demonstrated normal NSR.  Left chest was without hematoma or ecchymosis.  The device was interrogated and found to be functioning normally.  CXR was obtained and demonstrated no pneumothorax status post device implantation.  Wound care, arm mobility, and restrictions were reviewed with the patient.  The patient was examined and considered stable for discharge to home.   The patient's discharge medications include an ACE-I (Quinapril) and beta  blocker (Coreg).   Physical Exam: Filed Vitals:   06/21/16 0845 06/21/16 1138 06/21/16 1239 06/21/16 1400  BP: 162/87   137/61  Pulse: 81   77  Temp: 97.8 F (36.6 C)   97.7 F (36.5 C)  TempSrc: Oral   Oral  Resp: 16   18  Height: 5\' 4"  (1.626 m)     Weight: 280 lb (127.007 kg)     SpO2: 99% 100% 96% 100%    GEN- The patient is an obese appearing, alert and oriented x 3 today.   HEENT: normocephalic, atraumatic; sclera clear, conjunctiva pink; hearing intact; oropharynx clear; neck supple, no JVP Lymph- no cervical lymphadenopathy Lungs- Clear to ausculation bilaterally, normal work of breathing.  No wheezes, rales, rhonchi Heart- Regular rate and rhythm, no murmurs, rubs or gallops, PMI not laterally displaced GI- soft, obese, non-tender, non-distended, bowel sounds present, no hepatosplenomegaly Extremities- no clubbing, cyanosis, or edema; DP/PT/radial pulses 2+ bilaterally MS- no significant deformity or atrophy Skin- warm and dry, no rash or lesion, left chest without hematoma/ecchymosis Psych- euthymic mood, full affect Neuro- strength and sensation are intact   Labs:   Lab Results  Component Value Date   WBC 6.4 06/21/2016   HGB 13.0 06/21/2016   HCT 40.5 06/21/2016   MCV 87.3 06/21/2016   PLT 146* 06/21/2016    Recent Labs Lab 06/21/16 1041  NA 140  K 4.4  CL 105  CO2 27  BUN 22*  CREATININE 0.74  CALCIUM 8.7*  GLUCOSE 195*    Discharge Medications:    Medication List    ASK your doctor about these medications        aspirin EC 81  MG tablet  Take 81 mg by mouth daily.     carvedilol 6.25 MG tablet  Commonly known as:  COREG  Take 1 tablet (6.25 mg total) by mouth 2 (two) times daily.     colchicine 0.6 MG tablet  Take 0.6 mg by mouth as needed (for gout).     fluocinonide cream 0.05 %  Commonly known as:  LIDEX  Apply 1 application topically as needed (rash).     FLUoxetine 20 MG tablet  Commonly known as:  PROZAC  Take 20 mg by  mouth daily.     furosemide 40 MG tablet  Commonly known as:  LASIX  Take 1 tablet (40 mg total) by mouth 2 (two) times daily.     HUMALOG MIX 75/25 (75-25) 100 UNIT/ML Susp injection  Generic drug:  insulin lispro protamine-lispro  Inject 55-70 Units as directed daily. Inject 70 units at breakfast and dinner, and 55 units at lunch time     potassium chloride SA 20 MEQ tablet  Commonly known as:  K-DUR,KLOR-CON  Take 1 tablet (20 mEq total) by mouth as directed.     quinapril 20 MG tablet  Commonly known as:  ACCUPRIL  Take 20 mg by mouth 2 (two) times daily.     rOPINIRole 4 MG tablet  Commonly known as:  REQUIP  Take 4 mg by mouth daily.     rosuvastatin 20 MG tablet  Commonly known as:  CRESTOR  Take 20 mg by mouth daily.        Disposition:       Follow-up Information    Follow up with [facility] On 07/05/2016.   Specialty:  Cardiology   Why:  at 12Noon for wound check    Contact information:   [address] [place] [phone]      Duration of Discharge Encounter: Greater than 30 minutes including physician time.  Signed, Chanetta Marshall, NP 06/21/2016 3:08 PM  EP Attending  Patient seen and examined. Agree with above. He is stable after ICD implant. Catarina for discharge with usual followup.   Mikle Bosworth.D.

## 2016-06-21 NOTE — Progress Notes (Signed)
Patient arrived to 2W after ICD placement in no apparent distress.  Patient was oriented to room including call light and phone.  Telemetry monitor was applied and CCMD notified.  Will continue to monitor.

## 2016-06-21 NOTE — H&P (View-Only) (Signed)
HPI:  Jeffery Nelson is a 70 year old gentleman referred to me for lower extremity edema and shortness of breath. As you know, he is morbidly obese, almost super morbidly obese with a weight of 330 pounds, BMI of 54. He underwent an echocardiogram which demonstrated mild concentric LVH, EF greater than 55%. The left atrium was mildly dilated. However, there were no significant valvular abnormalities. Pulmonary pressures were not elevated. He also underwent lower extremity Dopplers looking for venous insufficiency. There was a small amount of reflux in the right common femoral vein, which is a deep vein. However, the superficial veins were negative for reflux or treatable venous insufficiency. I reviewed the results with the patient today and I feel that his best option is to continue with lower extremity compression stockings and work on weight loss. He is also at high risk for sleep apnea and I have referred him for a sleep study - unfortunately he never made this appointment. Currently has no other symptoms other than he related a recent bout of shingles that he underwent. This has since resolved and he was not left with any chronic pain.  I saw Jeffery Nelson back in the office today. Overall he is doing fairly well. He had left hip replacement placement recently due to what sound like a spontaneous fracture. Apparently he has of some degree of osteopenia. He was supposed been medicine for that but is not taking it. Fortunately has had about 30-35 pound weight loss and although he never got his foot split-night sleep study, he denies any new apnea symptoms and feels like he is rested during the day. He has been getting a little more shortness of breath mostly when laying down but that is gotten better and he has no new complaints today.  Jeffery Nelson returns today in the office. He reports that he's recently had some weight gain and lower extremity swelling. He says it is also has some shortness of breath with  exertion. Swelling seems to be a little worse in the right leg than the left. He reports she's been compliant with his current dose of Lasix. He is also taking over-the-counter potassium supplements. He denies any chest pain.  I saw Jeffery Nelson back today in follow-up. He reports he still persistently short of breath. I asked him to increase his Lasix and prescribe some potassium however he said he didn't have the money to get potassium and therefore was nervous about taking extra Lasix so he never increased the dose the medicine. In the interim though he's gained about 6 pounds. He does have lower strandy swelling and persistent shortness of breath. His echocardiogram unfortunate shows a new cardiomyopathy with an EF of 20-25% with global hypokinesis which is severe. Previous echo at least a year ago showed an EF which was normal. He denies any chest pain episodes or recent significant viral illnesses that might explain his reduced LV function. I'm surly concerned about coronary artery disease. He also denies any alcohol or drug use.  Jeffery Nelson returns today for follow-up of his heart catheterization, the results are as follows:   Ost 1st Diag to 1st Diag lesion, 50% stenosed.  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 Moderate pulmonary venous hypertension, reduced cardiac index. Elevated PCWP. Will need additional diuresis and medication adjustment for non-ischemic cardiomyopathy as an outpatient. I recommended increasing his Lasix to 40 mg twice a day. Since that change she's  had significant weight loss. Weight today is 258, which is down from 281. He said no, locations from his radial catheterization site. As mentioned, the plan is to titrate his medical therapy. He reports his shortness of breath is completely resolved and his leg swelling is improved  significantly.  06/03/2016  Jeffery Nelson returns today for follow-up. He has gained about 15 pounds. There is some extra edema but mostly this is weight gain not related to heart failure. However he is not as compliant with twice-daily Lasix. He denies any worsening shortness of breath. He had another episode of syncope which was witnessed. He became apparently pale and unresponsive. By the time EMS arrived he was doing well and declined transport to the hospital. He's had 3 episodes in about 6 months. This was the first episode that was witnessed. I'm concerned about a possible arrhythmia.  PMHx:  Past Medical History  Diagnosis Date  . Shortness of breath   . Dyspnea on exertion   . Lower extremity edema   . Benign neoplasm of colon   . Chest pain     From rib fractures  . Polyneuropathy in diabetes(357.2)   . Psychosexual dysfunction with inhibited sexual excitement   . Restless legs syndrome (RLS)   . Sleep arousal disorder   . Unspecified venous (peripheral) insufficiency   . Type II or unspecified type diabetes mellitus with unspecified complication, not stated as uncontrolled   . Pure hypercholesterolemia   . Hypertension     Past Surgical History  Procedure Laterality Date  . Lower extremity venous doppler  09/04/2012    Mild valvular insufficiency in the R Common Femoral vein w/ 1.6 sec of duration of refliux  . Transthoracic echocardiogram  09/04/2012    EF >55%, normal  . Intramedullary (im) nail intertrochanteric Left 09/21/2014    Procedure: left hip intertroch fracture; Surgeon: [name], MD; Location: [facility]; Service: Orthopedics; Laterality: Left;  . Cardiac catheterization N/A 02/12/2016    Procedure: Right/Left Heart Cath and Coronary Angiography; Surgeon: Pixie Casino, MD; Location: [facility]; Service: Cardiovascular; Laterality: N/A;    FAMHx:  Family History   Problem Relation Age of Onset  . Heart disease Mother   . Heart disease Father   . Diabetes Brother   . Heart disease Maternal Grandmother   . Heart disease Maternal Grandfather   . Stroke Paternal Grandmother   . Cancer Paternal Grandfather     Skin cancer    SOCHx:   reports that he has never smoked. He has never used smokeless tobacco. He reports that he does not drink alcohol or use illicit drugs.  ALLERGIES:  Allergies  Allergen Reactions  . Lipitor [Atorvastatin]   . Lyrica [Pregabalin]     ROS: A comprehensive review of systems was negative except for: Cardiovascular: positive for lower extremity edema  HOME MEDS: Current Outpatient Prescriptions  Medication Sig Dispense Refill  . aspirin EC 81 MG tablet Take 81 mg by mouth daily.    . carvedilol (COREG) 6.25 MG tablet Take 1 tablet (6.25 mg total) by mouth 2 (two) times daily. 180 tablet 1  . colchicine 0.6 MG tablet Take 0.6 mg by mouth as needed (for gout).     . fluocinonide cream (LIDEX) [id number] % Apply 1 application topically as needed (rash).    [name] FLUoxetine (PROZAC) 20 MG tablet Take 20 mg by mouth daily.    . furosemide (LASIX) 40 MG tablet Take 1 tablet (40 mg total) by mouth  2 (two) times daily. 60 tablet 6  . HUMALOG MIX 75/25 (75-25) 100 UNIT/ML SUSP injection Inject 55-70 Units as directed daily. Inject 70 units at breakfast and dinner, and 55 units at lunch time  2  . Multiple Vitamin (MULTIVITAMIN WITH MINERALS) TABS tablet Take 1 tablet by mouth daily.    [name] nystatin (MYCOSTATIN/NYSTOP) 100000 UNIT/GM POWD Apply to affected areas in groin twice daily - keep skin dry 60 g 1  . OVER THE COUNTER MEDICATION Take 1 tablet by mouth daily. Calcium 333 mg with Magnesium 133 mg and Zinc 5 mg tablets    . potassium chloride SA (K-DUR,KLOR-CON) 20 MEQ tablet Take 1 tablet (20 mEq total) by mouth as directed. 90 tablet  3  . quinapril (ACCUPRIL) 20 MG tablet Take 20 mg by mouth 2 (two) times daily.    [name] rOPINIRole (REQUIP) 4 MG tablet Take 4 mg by mouth daily.     . rosuvastatin (CRESTOR) 20 MG tablet Take 20 mg by mouth daily.     No current facility-administered medications for this visit.    LABS/IMAGING:  Lab Results Last 48 Hours    No results found for this or any previous visit (from the past 48 hour(s)).    Imaging Results (Last 48 hours)    No results found.    VITALS: BP 146/84 mmHg  Pulse 66  Ht 5\' 3"  (1.6 m)  Wt 276 lb (125.193 kg)  BMI 48.90 kg/m2  EXAM: General appearance: alert and no distress Lungs: clear to auscultation bilaterally Heart: regular rate and rhythm, S1, S2 normal, no murmur, click, rub or gallop Extremities: edema 1+ edema Skin: Skin color, texture, turgor normal. No rashes or lesions Neurologic: Grossly normal  EKG: Normal sinus rhythm at 73, LDH  ASSESSMENT: 1. New systolic congestive heart failure with EF 20-25% - nonischemic cardiomyopathy with mild CAD 2. Dyspnea, weight gain, leg swelling 3. Morbid obesity 4. Bilateral deep vein reflux with edema, right greater than left 5. Probable obstructive sleep apnea 6. Recent total L hip arthroplasty 7. Unexplained syncope  PLAN: 1. Jeffery Nelson has had weight gain and appears somewhat decompensated. I asked him to go back to his 40 mg twice a day Lasix since he's not been compliant with twice-daily dosing. He also needs to watch food intake and work on some walking and exercise to help lose weight. In addition he still has not had a sleep study, which was due to the fact that he had to reschedule because of an upper respiratory infection. He plans to get that within the next month. I'm concerned about his unexplained syncopal episodes. He's had several over the past 6 months but they're not very easy to predict and I'm concerned they may be related to ventricular arrhythmias in the setting of  a low EF. I like for him to have a loop recorder implanted to see if we can detect these episodes and if they're caused by a ventricular arrhythmia he will likely need a referral for AICD.       EP Attending  Patient seen and examined. The patient was referred today to have an ILR placed. He has severe LV dysfunction and is at high risk for malignant ventricular arrhythmias in the setting of a non-ischemic CM, EF 25%, and syncope. If his EF remains low, ICD insertion would be indicated and ILR insertion would not. If his EF has normalized, then ILR insertion would be reasonable. He is already on maximal medical therapy for CHF.  Mikle Bosworth.D.

## 2016-06-22 ENCOUNTER — Ambulatory Visit (HOSPITAL_COMMUNITY): Payer: Medicare Other

## 2016-06-22 DIAGNOSIS — I428 Other cardiomyopathies: Secondary | ICD-10-CM | POA: Diagnosis not present

## 2016-06-22 DIAGNOSIS — I2510 Atherosclerotic heart disease of native coronary artery without angina pectoris: Secondary | ICD-10-CM | POA: Diagnosis not present

## 2016-06-22 DIAGNOSIS — I110 Hypertensive heart disease with heart failure: Secondary | ICD-10-CM | POA: Diagnosis not present

## 2016-06-22 DIAGNOSIS — I5022 Chronic systolic (congestive) heart failure: Secondary | ICD-10-CM | POA: Diagnosis not present

## 2016-06-22 LAB — GLUCOSE, CAPILLARY
GLUCOSE-CAPILLARY: 143 mg/dL — AB (ref 65–99)
Glucose-Capillary: 113 mg/dL — ABNORMAL HIGH (ref 65–99)

## 2016-06-22 NOTE — Progress Notes (Signed)
06/22/2016 1240 Discharge AVS meds taken today and those due this evening reviewed.  Follow-up appointments and when to call md reviewed.  D/C IV and TELE.  Questions and concerns addressed.   D/C home per orders. [place]

## 2016-06-28 NOTE — H&P (Signed)
  ICD Criteria  Current LVEF:35%. Within 12 months prior to implant: Yes   Heart failure history: Yes, Class II  Cardiomyopathy history: Yes, Non-Ischemic Cardiomyopathy.  Atrial Fibrillation/Atrial Flutter: No.  Ventricular tachycardia history: No.  Cardiac arrest history: No.  History of syndromes with risk of sudden death: No.  Previous ICD: No.  Current ICD indication: Secondary (syncope and severe LV dysfunction)  PPM indication: No.   Class I or II Bradycardia indication present: No  Beta Blocker therapy for 3 or more months: Yes, prescribed.   Ace Inhibitor/ARB therapy for 3 or more months: Yes, prescribed.

## 2016-07-05 ENCOUNTER — Ambulatory Visit (INDEPENDENT_AMBULATORY_CARE_PROVIDER_SITE_OTHER): Payer: Medicare Other | Admitting: *Deleted

## 2016-07-05 ENCOUNTER — Encounter: Payer: Self-pay | Admitting: Internal Medicine

## 2016-07-05 DIAGNOSIS — I428 Other cardiomyopathies: Secondary | ICD-10-CM

## 2016-07-05 DIAGNOSIS — I5022 Chronic systolic (congestive) heart failure: Secondary | ICD-10-CM

## 2016-07-05 DIAGNOSIS — I429 Cardiomyopathy, unspecified: Secondary | ICD-10-CM | POA: Diagnosis not present

## 2016-07-05 NOTE — Progress Notes (Signed)
Wound check appointment. Steri-strips were removed prior to appt. Wound without redness or edema. Incision edges approximated, wound well healed. Stitch removed from L corner of incision--antibiotic ointment/band aid applied (patient to remove later today). Normal device function. Threshold, sensing, and impedances consistent with implant measurements. Device programmed at 3.5V for extra safety margin until 3 month visit. Histogram distribution appropriate for patient and level of activity. No ventricular arrhythmias noted. Patient educated about wound care, arm mobility, lifting restrictions, shock plan. ROV in 3 months with GT.

## 2016-09-07 ENCOUNTER — Ambulatory Visit (INDEPENDENT_AMBULATORY_CARE_PROVIDER_SITE_OTHER): Payer: Medicare Other | Admitting: Internal Medicine

## 2016-09-07 ENCOUNTER — Encounter: Payer: Self-pay | Admitting: Internal Medicine

## 2016-09-07 VITALS — BP 126/64 | HR 75 | Ht 64.0 in | Wt 301.0 lb

## 2016-09-07 DIAGNOSIS — I429 Cardiomyopathy, unspecified: Secondary | ICD-10-CM | POA: Diagnosis not present

## 2016-09-07 DIAGNOSIS — I5023 Acute on chronic systolic (congestive) heart failure: Secondary | ICD-10-CM

## 2016-09-07 DIAGNOSIS — M7989 Other specified soft tissue disorders: Secondary | ICD-10-CM

## 2016-09-07 DIAGNOSIS — I428 Other cardiomyopathies: Secondary | ICD-10-CM

## 2016-09-07 MED ORDER — METOLAZONE 2.5 MG PO TABS: ORAL_TABLET | ORAL | 3 refills | Status: DC

## 2016-09-07 NOTE — Patient Instructions (Addendum)
Medication Instructions:  START Metolazone 2.5mg  take 1 tablet on Monday, Wednesday, and Friday 30-60 Minutes before taking Lasix morning dose.  Labwork: None Ordered  Testing/Procedures: None ordered  Follow-Up: Your physician recommends that you schedule a follow-up appointment in: 2 weeks with PA or NP for Hilty.  Any Other Special Instructions Will Be Listed Below (If Applicable).  NEXT APPT  DATE:______________________________________  TIME:______________________________________AM/PM    If you need a refill on your cardiac medications before your next appointment, please call your pharmacy.

## 2016-09-07 NOTE — Progress Notes (Signed)
OFFICE NOTE  Chief Complaint:  Follow-up heart failure  Primary Care Physician: Stephens Shire, MD  HPI:  Jeffery Nelson is a 70 year old gentleman referred to me for lower extremity edema and shortness of breath. As you know, he is morbidly obese, almost super morbidly obese with a weight of 330 pounds, BMI of 54. He underwent an echocardiogram which demonstrated mild concentric LVH, EF greater than 55%. The left atrium was mildly dilated. However, there were no significant valvular abnormalities. Pulmonary pressures were not elevated. He also underwent lower extremity Dopplers looking for venous insufficiency. There was a small amount of reflux in the right common femoral vein, which is a deep vein. However, the superficial veins were negative for reflux or treatable venous insufficiency. I reviewed the results with the patient today and I feel that his best option is to continue with lower extremity compression stockings and work on weight loss. He is also at high risk for sleep apnea and I have referred him for a sleep study - unfortunately he never made this appointment.  Currently has no other symptoms other than he related a recent bout of shingles that he underwent. This has since resolved and he was not left with any chronic pain.  I saw [name] back in the office today. Overall he is doing fairly well. He had left hip replacement placement recently due to what sound like a spontaneous fracture. Apparently he has of some degree of osteopenia. He was supposed been medicine for that but is not taking it. Fortunately has had about 30-35 pound weight loss and although he never got his foot split-night sleep study, he denies any new apnea symptoms and feels like he is rested during the day. He has been getting a little more shortness of breath mostly when laying down but that is gotten better and he has no new complaints today.  [name] returns today in the office. He reports that he's  recently had some weight gain and lower extremity swelling. He says it is also has some shortness of breath with exertion. Swelling seems to be a little worse in the right leg than the left. He reports she's been compliant with his current dose of Lasix. He is also taking over-the-counter potassium supplements. He denies any chest pain.  I saw [name] back today in follow-up. He reports he still persistently short of breath. I asked him to increase his Lasix and prescribe some potassium however he said he didn't have the money to get potassium and therefore was nervous about taking extra Lasix so he never increased the dose the medicine. In the interim though he's gained about 6 pounds. He does have lower strandy swelling and persistent shortness of breath. His echocardiogram unfortunate shows a new cardiomyopathy with an EF of 20-25% with global hypokinesis which is severe. Previous echo at least a year ago showed an EF which was normal. He denies any chest pain episodes or recent significant viral illnesses that might explain his reduced LV function. I'm surly concerned about coronary artery disease. He also denies any alcohol or drug use.  [name] returns today for follow-up of his heart catheterization, the results are as follows:   Ost 1st Diag to 1st Diag lesion, 50% stenosed.  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 Moderate pulmonary venous hypertension, reduced cardiac index. Elevated PCWP. Will need additional diuresis and medication adjustment for  non-ischemic cardiomyopathy as an outpatient. I recommended increasing his Lasix to 40 mg twice a day. Since that change she's had significant weight loss. Weight today is 258, which is down from 281. He said no, locations from his radial catheterization site. As mentioned, the plan is to titrate his medical therapy. He  reports his shortness of breath is completely resolved and his leg swelling is improved significantly.  06/03/2016  [name] returns today for follow-up. He has gained about 15 pounds. There is some extra edema but mostly this is weight gain not related to heart failure. However he is not as compliant with twice-daily Lasix. He denies any worsening shortness of breath. He had another episode of syncope which was witnessed. He became apparently pale and unresponsive. By the time EMS arrived he was doing well and declined transport to the hospital. He's had 3 episodes in about 6 months. This was the first episode that was witnessed. I'm concerned about a possible arrhythmia.  09/07/2016  [name] seen today in follow-up. He reports little improvement with increasing his Lasix. Weight is now up to 301 pounds, from 276 lbs approximately 3 months ago. He reports significant lower extremity swelling and weeping edema. 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 He has had no complications from that AICD.  PMHx:  Past Medical History:  Diagnosis Date  . AICD (automatic cardioverter/defibrillator) present   . Benign neoplasm of colon   . Chest pain    From rib fractures  . Dyspnea on exertion   . Gout   . Hypertension   . Lower extremity edema   . Polyneuropathy in diabetes(357.2)   . Psychosexual dysfunction with inhibited sexual excitement   . Pure hypercholesterolemia   . Restless legs syndrome (RLS)   . Shortness of breath   . Sleep apnea    "probably" (06/21/2016)  . Sleep arousal disorder   . Type II diabetes mellitus ([place])   . Unspecified venous (peripheral) insufficiency     Past Surgical History:  Procedure Laterality Date  . CARDIAC CATHETERIZATION N/A 02/12/2016   Procedure: Right/Left Heart Cath and Coronary Angiography;  Surgeon: Pixie Casino, MD;  Location: [facility];  Service:  Cardiovascular;  Laterality: N/A;  . CATARACT EXTRACTION W/ INTRAOCULAR LENS  IMPLANT, BILATERAL Bilateral 2009  . EP IMPLANTABLE DEVICE N/A 06/21/2016   Procedure: ICD Implant;  Surgeon: Evans Lance, MD;  Location: [facility];  Service: Cardiovascular;  Laterality: N/A;  . FIXATION KYPHOPLASTY LUMBAR SPINE  2008  . FRACTURE SURGERY    . INTRAMEDULLARY (IM) NAIL INTERTROCHANTERIC Left 09/21/2014   Procedure: left hip intertroch fracture;  Surgeon: [name], MD;  Location: [facility];  Service: Orthopedics;  Laterality: Left;  . LOWER EXTREMITY VENOUS DOPPLER  09/04/2012   Mild valvular insufficiency in the R Common Femoral vein w/ 1.6 sec of duration of refliux  . TRANSTHORACIC ECHOCARDIOGRAM  09/04/2012   EF >55%, normal    FAMHx:  Family History  Problem Relation Age of Onset  . Heart disease Mother   . Heart disease Father   . Heart disease Maternal Grandmother   . Heart disease Maternal Grandfather   . Stroke Paternal Grandmother   . Cancer Paternal Grandfather     Skin cancer  . Diabetes Brother     SOCHx:   reports that he has never smoked. He has never used smokeless tobacco.  He reports that he does not drink alcohol or use drugs.  ALLERGIES:  Allergies  Allergen Reactions  . Lipitor [Atorvastatin] Swelling  . Lyrica [Pregabalin] Swelling    ROS: A comprehensive review of systems was negative except for: Cardiovascular: positive for lower extremity edema  HOME MEDS: Current Outpatient Prescriptions  Medication Sig Dispense Refill  . aspirin EC 81 MG tablet Take 81 mg by mouth daily.    . carvedilol (COREG) 6.25 MG tablet Take 1 tablet (6.25 mg total) by mouth 2 (two) times daily. 180 tablet 1  . cephALEXin (KEFLEX) 500 MG capsule Take 500 mg by mouth 4 (four) times daily.    . colchicine 0.6 MG tablet Take 0.6 mg by mouth as needed (for gout).     . fluocinonide cream (LIDEX) AB-[id number] % Apply 1 application topically as needed (rash).    [name] FLUoxetine (PROZAC) 20  MG tablet Take 20 mg by mouth daily.    . furosemide (LASIX) 40 MG tablet Take 1 tablet (40 mg total) by mouth 2 (two) times daily. 60 tablet 6  . HUMALOG MIX 75/25 (75-25) 100 UNIT/ML SUSP injection Inject 55-70 Units as directed daily. Inject 70 units at breakfast and dinner, and 55 units at lunch time  2  . potassium chloride SA (K-DUR,KLOR-CON) 20 MEQ tablet Take 1 tablet (20 mEq total) by mouth as directed. (Patient taking differently: Take 20 mEq by mouth 2 (two) times daily. ) 90 tablet 3  . quinapril (ACCUPRIL) 20 MG tablet Take 20 mg by mouth 2 (two) times daily.    [name] rOPINIRole (REQUIP) 4 MG tablet Take 4 mg by mouth daily.     . rosuvastatin (CRESTOR) 20 MG tablet Take 20 mg by mouth daily.     No current facility-administered medications for this visit.     LABS/IMAGING: No results found for this or any previous visit (from the past 48 hour(s)). No results found.  VITALS: BP 126/64   Pulse 75   Ht 5\' 4"  (1.626 m)   Wt (!) 301 lb (136.5 kg)   BMI 51.67 kg/m   EXAM: General appearance: alert and no distress Lungs: clear to auscultation bilaterally Heart: regular rate and rhythm, S1, S2 normal, no murmur, click, rub or gallop Extremities: weeeping 3+ bilateral edema - compression stockings in place Skin: Skin color, texture, turgor normal. No rashes or lesions Neurologic: Grossly normal  EKG: Deferred  ASSESSMENT: 1. New systolic congestive heart failure with EF 20-25% - nonischemic cardiomyopathy with mild CAD 2. Dyspnea, weight gain, leg swelling 3. Morbid obesity 4. Bilateral deep vein reflux with edema, right greater than left 5. Probable obstructive sleep apnea - awaiting sleep study 6. Recent total L hip arthroplasty 7. 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   Mr. Caswell underwent recent placement of an AICD for presumed NSVT the setting of unexplained syncope with cardiomyopathy. Unfortunately he's had persistent weight gain  despite increasing his diuretic. Weight is now up 20 pounds. He has significant congestive edema of his legs with weeping and has bilateral compression stockings in place. We will attempt additional diuresis with addition of metolazone 2.5 mg 3 times a week prior to his morning dose of Lasix. Plan to see him back in 2 weeks. If he does not diuresis significantly with this, he will likely need to be admitted for IV diuretics.  Pixie Casino, MD, [facility] Attending Cardiologist [place] C Hilty 09/07/2016, 1:54 PM

## 2016-09-21 ENCOUNTER — Encounter: Payer: Self-pay | Admitting: Nurse Practitioner

## 2016-09-21 ENCOUNTER — Ambulatory Visit (INDEPENDENT_AMBULATORY_CARE_PROVIDER_SITE_OTHER): Payer: Medicare Other | Admitting: Student

## 2016-09-21 VITALS — BP 126/64 | HR 69 | Ht 63.0 in | Wt 287.0 lb

## 2016-09-21 DIAGNOSIS — I5042 Chronic combined systolic (congestive) and diastolic (congestive) heart failure: Secondary | ICD-10-CM

## 2016-09-21 DIAGNOSIS — I428 Other cardiomyopathies: Secondary | ICD-10-CM | POA: Diagnosis not present

## 2016-09-21 DIAGNOSIS — I110 Hypertensive heart disease with heart failure: Secondary | ICD-10-CM | POA: Insufficient documentation

## 2016-09-21 DIAGNOSIS — I2510 Atherosclerotic heart disease of native coronary artery without angina pectoris: Secondary | ICD-10-CM | POA: Diagnosis not present

## 2016-09-21 DIAGNOSIS — E119 Type 2 diabetes mellitus without complications: Secondary | ICD-10-CM | POA: Insufficient documentation

## 2016-09-21 DIAGNOSIS — I10 Essential (primary) hypertension: Secondary | ICD-10-CM

## 2016-09-21 DIAGNOSIS — R55 Syncope and collapse: Secondary | ICD-10-CM | POA: Insufficient documentation

## 2016-09-21 DIAGNOSIS — Z95810 Presence of automatic (implantable) cardiac defibrillator: Secondary | ICD-10-CM

## 2016-09-21 DIAGNOSIS — I5043 Acute on chronic combined systolic (congestive) and diastolic (congestive) heart failure: Secondary | ICD-10-CM | POA: Insufficient documentation

## 2016-09-21 DIAGNOSIS — E785 Hyperlipidemia, unspecified: Secondary | ICD-10-CM

## 2016-09-21 LAB — BASIC METABOLIC PANEL
BUN: 22 mg/dL (ref 7–25)
CHLORIDE: 101 mmol/L (ref 98–110)
CO2: 29 mmol/L (ref 20–31)
Calcium: 9.4 mg/dL (ref 8.6–10.3)
Creat: 0.84 mg/dL (ref 0.70–1.18)
Glucose, Bld: 117 mg/dL — ABNORMAL HIGH (ref 65–99)
POTASSIUM: 4.4 mmol/L (ref 3.5–5.3)
SODIUM: 140 mmol/L (ref 135–146)

## 2016-09-21 NOTE — Patient Instructions (Signed)
Medication Instructions:   NO CHANGE  Labwork:  Your physician recommends that you HAVE LAB WORK TODAY  Follow-Up:  Your physician recommends that you schedule a follow-up appointment in: WITH DR Lovena Le AS SCHEDULED

## 2016-09-21 NOTE — Progress Notes (Signed)
Cardiology Office Note    Date:  09/21/2016   ID:  Jeffery, Nelson Jul 06, 1946, MRN [id number]  PCP:  Stephens Shire, MD  Cardiologist:  Dr. Debara Pickett  Chief Complaint  Patient presents with  . Follow-up    2 weeks  pt has lost weight, but is still having some mild swelling; denies CP, SOB, and dizziness    History of Present Illness:    Jeffery Nelson is a 70 y.o. male with past medical history of nonischemic cardiomyopathy (EF 35-40% by echo in 05/2016, ICD placed in 06/2016 for syncope thought secondary to VT), chronic combined systolic and diastolic CHF, non-obstructive CAD (by cath in 01/2016), HTN, HLD, and Type 2 DM who presents to the office today for follow-up.   Following ICD implantation in 06/2016, he was seen in the office by Dr. Debara Pickett on 9/19 and reported worsening lower extremity edema. Weight was noted to be 301 lbs, up from 276 lbs in 05/2016. He was continued on Lasix 40mg  BID with the addition of Metolazone 2.5mg  three times weekly.   In talking with the patient today, he reports doing well since his recent office visit. Notes significant improvement in his abdominal distension and lower extremity edema. Swelling along right leg is worse than left, which he says is a chronic issue. Denies any orthopnea or PND.   He weighs himself daily and says weights have down-trended since starting Metolazone. Weight is down to 287 lbs today, a net difference of 14 lbs since his last office visit. He reports good compliance with his medications, taking Metolazone 3 times weekly 30 minutes before his PO Lasix. Has been taking evening dose of PO Lasix between 1600-1700. Reports frequent urination and says this has been stable since starting the Metolazone.   He denies any chest discomfort or palpitations. Is currently being followed by the wound clinic for a cut he sustained to his right foot. Currently on Keflex 500mg  QID for this.   Past Medical History:  Diagnosis Date  .  AICD (automatic cardioverter/defibrillator) present    a. 06/21/2016 SJM single lead AICD (ser # [id number]).  . Benign neoplasm of colon   . Chest pain    From rib fractures  . Chronic combined systolic and diastolic CHF (congestive heart failure) ([place])    a. 01/2016 Echo: EF 20-25%, diff HK; b. 05/2016 Echo: EF 35-40%, diff HK, gr1 DD, diff HK, mild MR, mod dil LA/RA, mod TR, PASP 29mmHg.  [name] Gout   . Hypertensive heart disease with heart failure ([place])   . Lower extremity edema   . NICM (nonischemic cardiomyopathy) ([place])     01/2016 Echo: EF 20-25%, b. 01/2016 Cath: LM nl, LAD min irregs, D1 50ost, LCX min irregs, RCA min irregs, RPDA/RPL1 nl;  c. 05/2016 Echo: EF 35-40%;  d. 06/2016 s/p SJM single lead AICD (ser # [id number])..  . Non-obstructive CAD    a. 01/2016 Cath: LM nl, LAD min irregs, D1 50ost, LCX min irregs, RCA min irregs, RPDA/RPL1 nl.  . Polyneuropathy in diabetes(357.2)   . Psychosexual dysfunction with inhibited sexual excitement   . Pure hypercholesterolemia   . Restless legs syndrome (RLS)   . Sleep apnea    "probably" (06/21/2016)  . Sleep arousal disorder   . Syncope    a. Presumed to be 2/2 VT in setting of NICM-->s/p single lead SJM AICD in 06/2016.  . Type II diabetes mellitus ([place])   . Unspecified venous (peripheral) insufficiency  Past Surgical History:  Procedure Laterality Date  . CARDIAC CATHETERIZATION N/A 02/12/2016   Procedure: Right/Left Heart Cath and Coronary Angiography;  Surgeon: Pixie Casino, MD;  Location: [facility];  Service: Cardiovascular;  Laterality: N/A;  . CATARACT EXTRACTION W/ INTRAOCULAR LENS  IMPLANT, BILATERAL Bilateral 2009  . EP IMPLANTABLE DEVICE N/A 06/21/2016   Procedure: ICD Implant;  Surgeon: Evans Lance, MD;  Location: [facility];  Service: Cardiovascular;  Laterality: N/A;  . FIXATION KYPHOPLASTY LUMBAR SPINE  2008  . FRACTURE SURGERY    . INTRAMEDULLARY (IM) NAIL INTERTROCHANTERIC Left 09/21/2014   Procedure: left  hip intertroch fracture;  Surgeon: [name], MD;  Location: [facility];  Service: Orthopedics;  Laterality: Left;  . LOWER EXTREMITY VENOUS DOPPLER  09/04/2012   Mild valvular insufficiency in the R Common Femoral vein w/ 1.6 sec of duration of refliux  . TRANSTHORACIC ECHOCARDIOGRAM  09/04/2012   EF >55%, normal    Current Medications: Outpatient Medications Prior to Visit  Medication Sig Dispense Refill  . aspirin EC 81 MG tablet Take 81 mg by mouth daily.    . carvedilol (COREG) 6.25 MG tablet Take 1 tablet (6.25 mg total) by mouth 2 (two) times daily. 180 tablet 1  . cephALEXin (KEFLEX) 500 MG capsule Take 500 mg by mouth 4 (four) times daily.    . colchicine 0.6 MG tablet Take 0.6 mg by mouth as needed (for gout).     . fluocinonide cream (LIDEX) [id number] % Apply 1 application topically as needed (rash).    [name] FLUoxetine (PROZAC) 20 MG tablet Take 20 mg by mouth daily.    . furosemide (LASIX) 40 MG tablet Take 1 tablet (40 mg total) by mouth 2 (two) times daily. 60 tablet 6  . HUMALOG MIX 75/25 (75-25) 100 UNIT/ML SUSP injection Inject 55-70 Units as directed daily. Inject 70 units at breakfast and dinner, and 55 units at lunch time  2  . metolazone (ZAROXOLYN) 2.5 MG tablet Take 1 tab by mouth Monday, Wednesday and Friday 30-60 minutes before taking morning Lasix tablet. 30 tablet 3  . potassium chloride SA (K-DUR,KLOR-CON) 20 MEQ tablet Take 1 tablet (20 mEq total) by mouth as directed. (Patient taking differently: Take 20 mEq by mouth 2 (two) times daily. ) 90 tablet 3  . quinapril (ACCUPRIL) 20 MG tablet Take 20 mg by mouth 2 (two) times daily.    [name] rOPINIRole (REQUIP) 4 MG tablet Take 4 mg by mouth daily.     . rosuvastatin (CRESTOR) 20 MG tablet Take 20 mg by mouth daily.     No facility-administered medications prior to visit.      Allergies:   Lipitor [atorvastatin] and Lyrica [pregabalin]   Social History   Social History  . Marital status: Widowed    Spouse name: N/A  . Number  of children: N/A  . Years of education: N/A   Social History Main Topics  . Smoking status: Never Smoker  . Smokeless tobacco: Never Used  . Alcohol use No  . Drug use: No  . Sexual activity: Yes   Other Topics Concern  . None   Social History Narrative  . None     Family History:  The patient's family history includes Cancer in his paternal grandfather; Diabetes in his brother; Heart disease in his father, maternal grandfather, maternal grandmother, and mother; Stroke in his paternal grandmother.   Review of Systems:   Please see the history of present illness.  General:  No chills, fever, night sweats or weight changes.  Cardiovascular:  No chest pain, dyspnea on exertion, orthopnea, palpitations, paroxysmal nocturnal dyspnea. Positive for lower extremity edema.  Dermatological: No rash, lesions/masses Respiratory: No cough, dyspnea Urologic: No hematuria, dysuria Abdominal:   No nausea, vomiting, diarrhea, bright red blood per rectum, melena, or hematemesis Neurologic:  No visual changes, wkns, changes in mental status. All other systems reviewed and are otherwise negative except as noted above.   Physical Exam:    VS:  BP 126/64 (BP Location: Left Arm, Patient Position: Sitting, Cuff Size: Large)   Pulse 69   Ht 5\' 3"  (1.6 m)   Wt 287 lb (130.2 kg)   BMI 50.84 kg/m    General: Pleasant obese Caucasian male appearing in no acute distress. Multiple ink markings noted.  Head: Normocephalic, atraumatic, sclera non-icteric, no xanthomas, nares are without discharge.  Neck: No carotid bruits. JVD does not appear elevated but is difficult to assess secondary to body habitus.  Lungs: Respirations regular and unlabored, without wheezes or rales.  Heart: Regular rate and rhythm. No S3 or S4.  No murmur, no rubs, or gallops appreciated. Abdomen: Soft, non-tender, non-distended with normoactive bowel sounds. No hepatomegaly. No rebound/guarding. No obvious abdominal  masses. Msk:  Strength and tone appear normal for age. No joint deformities or effusions. Extremities: No clubbing or cyanosis. 2+ lower extremity edema on right, 1+ on left. Compression stockings in place. Distal pedal pulses are 2+ bilaterally. Neuro: Alert and oriented X 3. Moves all extremities spontaneously. No focal deficits noted. Psych:  Responds to questions appropriately with a normal affect. Skin: No rashes or lesions noted  Wt Readings from Last 3 Encounters:  09/21/16 287 lb (130.2 kg)  09/07/16 (!) 301 lb (136.5 kg)  06/22/16 285 lb (129.3 kg)     Studies/Labs Reviewed:   EKG:  EKG is not ordered today.   Recent Labs: 02/03/2016: ALT 13; Brain Natriuretic Peptide 294.6 02/04/2016: TSH 0.91 06/21/2016: BUN 22; Creatinine, Ser 0.74; Hemoglobin 13.0; Platelets 146; Potassium 4.4; Sodium 140   Lipid Panel    Component Value Date/Time   CHOL 114 (L) 02/03/2016 1052   TRIG 99 02/03/2016 1052   HDL 42 02/03/2016 1052   CHOLHDL 2.7 02/03/2016 1052   VLDL 20 02/03/2016 1052   LDLCALC 52 02/03/2016 1052    Additional studies/ records that were reviewed today include:   Echocardiogram: 05/2016 Study Conclusions  - Left ventricle: The cavity size was normal. Wall thickness was   increased in a pattern of mild LVH. Systolic function was   moderately reduced. The estimated ejection fraction was in the   range of 35% to 40%. Diffuse hypokinesis. Doppler parameters are   consistent with abnormal left ventricular relaxation (grade 1   diastolic dysfunction). The E/e&' ratio is >15, suggesting   elevated LV filling pressure. Ejection fraction (MOD, 2-plane):   38%. - Mitral valve: Mildly thickened leaflets . There was mild   regurgitation. - Left atrium: Moderately dilated. - Right ventricle: The cavity size was normal. Systolic function   was normal. Lateral annulus peak S velocity: 17.2 cm/s. - Right atrium: The atrium was mildly dilated. - Tricuspid valve: There was  moderate regurgitation. - Pulmonary arteries: PA peak pressure: 40 mm Hg (S). - Inferior vena cava: The vessel was normal in size. The   respirophasic diameter changes were in the normal range (>= 50%),   consistent with normal central venous pressure.  Impressions: - Compared to the prior  study in 01/2016, the LVEF has improved to   35-40%.  Assessment:    1. Chronic combined systolic and diastolic CHF (congestive heart failure) ([place])   2. NICM (nonischemic cardiomyopathy) ([place])   3. AICD (automatic cardioverter/defibrillator) present   4. Coronary artery disease involving native coronary artery of native heart without angina pectoris   5. Essential hypertension   6. Hyperlipidemia, unspecified hyperlipidemia type      Plan:   In order of problems listed above:  1. Chronic Combined Systolic and Diastolic CHF/ Nonischemic Cardiomyopathy - echo from 05/2016 showed EF of 35-40% with Grade 1 DD. Non-obstructive CAD by cath in 01/2016. - seen today for follow-up from previous visit where weight was elevated to 301 lbs (276 lbs in 05/2016) and noted to have worsening lower extremity edema and abdominal distension. Continued on PO Lasix 40mg  BID with the addition of Metolazone 2.5mg  three times weekly.  - today, he notes significant improvement in his abdominal distension and lower extremity edema. Still with edema but lungs are clear and no visible JVD noted. Weight down 14 lbs since previous office visit, at 287 lbs today.  - will check BMET to assess creatinine and K+. Will continue PO Lasix and Metolazone three times weekly for now as he still appears volume overloaded on physical exam (baseline weight approximately 276 lbs). If creatinine significantly elevated, will need to decrease Metolazone frequency. - continue Coreg and ACE-I.   2. AICD  - ICD placed in 06/2016 for syncope thought to be secondary to VT. - has defibrillator check with Dr. Lovena Le in 2 weeks.   3. CAD -  nonobstructive CAD by cath in 01/2016 with 50% Ost 1st Diag lesion and minimal luminal irregularities. - denies any recent anginal symptoms. - continue ASA, BB, and statin therapy.  4. HTN - well-controlled. - continue current medication regimen.  5. HLD - Lipid Panel in 05/2016 showed total cholesterol 92, LDL 35, and HDL 42. - continue Crestor 20mg  daily.    Medication Adjustments/Labs and Tests Ordered: Current medicines are reviewed at length with the patient today.  Concerns regarding medicines are outlined above.  Medication changes, Labs and Tests ordered today are listed in the Patient Instructions below. Patient Instructions  Medication Instructions:   NO CHANGE  Labwork:  Your physician recommends that you HAVE LAB WORK TODAY  Follow-Up:  Your physician recommends that you schedule a follow-up appointment in: WITH DR [facility] AS SCHEDULED     Signed, Jeffery Heritage, PA  09/21/2016 3:31 PM    [facility] [address] [place], [place]  [address] Phone: [phone]; Fax: [phone]  [address] [place], [place] [address] Phone: [phone]

## 2016-09-22 ENCOUNTER — Telehealth: Payer: Self-pay | Admitting: Student

## 2016-09-22 ENCOUNTER — Encounter: Payer: Self-pay | Admitting: Student

## 2016-09-22 DIAGNOSIS — I10 Essential (primary) hypertension: Secondary | ICD-10-CM

## 2016-09-22 NOTE — Telephone Encounter (Signed)
New message   Pt verbalized that he is calling for his lab results

## 2016-09-22 NOTE — Telephone Encounter (Signed)
Returned call to patient no answer.LMTC. 

## 2016-09-23 NOTE — Telephone Encounter (Signed)
-----   Message from Erma Heritage, [place] sent at 09/22/2016 10:32 AM EDT ----- Please let the patient know his kidney function and potassium are both stable. Continue current regimen with Lasix, Metolazone, and K+ supplementation. Will need repeat BMET when he sees Dr. Lovena Le in 2 weeks.

## 2016-09-23 NOTE — Telephone Encounter (Signed)
Pt aware of his lab results and advised him that we need to repeat BMET 10/05/16 when he sees Dr. Lovena Le. Pt agreeable with this plan. Order in [place].

## 2016-10-05 ENCOUNTER — Ambulatory Visit (INDEPENDENT_AMBULATORY_CARE_PROVIDER_SITE_OTHER): Payer: Medicare Other | Admitting: Internal Medicine

## 2016-10-05 ENCOUNTER — Encounter: Payer: Self-pay | Admitting: Internal Medicine

## 2016-10-05 DIAGNOSIS — Z95810 Presence of automatic (implantable) cardiac defibrillator: Secondary | ICD-10-CM

## 2016-10-05 LAB — CUP PACEART INCLINIC DEVICE CHECK
Date Time Interrogation Session: 20171017103959
HighPow Impedance: 82.125
Implantable Lead Implant Date: 20170703
Implantable Lead Location: 753860
Lead Channel Pacing Threshold Amplitude: 1 V
Lead Channel Pacing Threshold Pulse Width: 0.5 ms
Lead Channel Pacing Threshold Pulse Width: 0.5 ms
MDC IDC LEAD MODEL: 7122
MDC IDC MSMT BATTERY REMAINING LONGEVITY: 99.6
MDC IDC MSMT LEADCHNL RV IMPEDANCE VALUE: 550 Ohm
MDC IDC MSMT LEADCHNL RV PACING THRESHOLD AMPLITUDE: 1 V
MDC IDC MSMT LEADCHNL RV SENSING INTR AMPL: 12 mV
MDC IDC PG SERIAL: 7372545
MDC IDC SET LEADCHNL RV PACING AMPLITUDE: 2.5 V
MDC IDC SET LEADCHNL RV PACING PULSEWIDTH: 0.5 ms
MDC IDC SET LEADCHNL RV SENSING SENSITIVITY: 0.5 mV
MDC IDC STAT BRADY RV PERCENT PACED: 0 %

## 2016-10-05 NOTE — Progress Notes (Signed)
HPI Jeffery Nelson returns today for ongoing evaluation of syncope, LV dysfunction, chronic systolic heart failure and is s/p ICD implantation. He has been stable in the interim with no ICD shock. No palpitations. He remains unable to lose weight. No edema. He has not had syncope. Allergies  Allergen Reactions  . Lipitor [Atorvastatin] Swelling  . Lyrica [Pregabalin] Swelling     Current Outpatient Prescriptions  Medication Sig Dispense Refill  . aspirin EC 81 MG tablet Take 81 mg by mouth daily.    . carvedilol (COREG) 6.25 MG tablet Take 1 tablet (6.25 mg total) by mouth 2 (two) times daily. 180 tablet 1  . colchicine 0.6 MG tablet Take 0.6 mg by mouth as needed (for gout).     . fluocinonide cream (LIDEX) [id number] % Apply 1 application topically as needed (rash).    [name] FLUoxetine (PROZAC) 20 MG tablet Take 20 mg by mouth daily.    . furosemide (LASIX) 40 MG tablet Take 1 tablet (40 mg total) by mouth 2 (two) times daily. 60 tablet 6  . HUMALOG MIX 75/25 (75-25) 100 UNIT/ML SUSP injection Inject 55-70 Units as directed daily. Inject 70 units at breakfast and dinner, and 55 units at lunch time  2  . metolazone (ZAROXOLYN) 2.5 MG tablet Take 1 tab by mouth Monday, Wednesday and Friday 30-60 minutes before taking morning Lasix tablet. 30 tablet 3  . potassium chloride SA (K-DUR,KLOR-CON) 20 MEQ tablet Take 1 tablet (20 mEq total) by mouth as directed. (Patient taking differently: Take 20 mEq by mouth 2 (two) times daily. ) 90 tablet 3  . quinapril (ACCUPRIL) 20 MG tablet Take 20 mg by mouth 2 (two) times daily.    [name] rOPINIRole (REQUIP) 4 MG tablet Take 4 mg by mouth daily.     . rosuvastatin (CRESTOR) 20 MG tablet Take 20 mg by mouth daily.     No current facility-administered medications for this visit.      Past Medical History:  Diagnosis Date  . AICD (automatic cardioverter/defibrillator) present    a. 06/21/2016 SJM single lead AICD (ser # [id number]).  . Benign neoplasm of colon   .  Chest pain    From rib fractures  . Chronic combined systolic and diastolic CHF (congestive heart failure) ([place])    a. 01/2016 Echo: EF 20-25%, diff HK; b. 05/2016 Echo: EF 35-40%, diff HK, gr1 DD, diff HK, mild MR, mod dil LA/RA, mod TR, PASP 87mmHg.  [name] Gout   . Hypertensive heart disease with heart failure ([place])   . Lower extremity edema   . NICM (nonischemic cardiomyopathy) ([place])     01/2016 Echo: EF 20-25%, b. 01/2016 Cath: LM nl, LAD min irregs, D1 50ost, LCX min irregs, RCA min irregs, RPDA/RPL1 nl;  c. 05/2016 Echo: EF 35-40%;  d. 06/2016 s/p SJM single lead AICD (ser # [id number])..  . Non-obstructive CAD    a. 01/2016 Cath: LM nl, LAD min irregs, D1 50ost, LCX min irregs, RCA min irregs, RPDA/RPL1 nl.  . Polyneuropathy in diabetes(357.2)   . Psychosexual dysfunction with inhibited sexual excitement   . Pure hypercholesterolemia   . Restless legs syndrome (RLS)   . Sleep apnea    "probably" (06/21/2016)  . Sleep arousal disorder   . Syncope    a. Presumed to be 2/2 VT in setting of NICM-->s/p single lead SJM AICD in 06/2016.  . Type II diabetes mellitus ([place])   . Unspecified venous (peripheral) insufficiency  ROS:   All systems reviewed and negative except as noted in the HPI.   Past Surgical History:  Procedure Laterality Date  . CARDIAC CATHETERIZATION N/A 02/12/2016   Procedure: Right/Left Heart Cath and Coronary Angiography;  Surgeon: Pixie Casino, MD;  Location: [place] [facility];  Service: Cardiovascular;  Laterality: N/A;  . CATARACT EXTRACTION W/ INTRAOCULAR LENS  IMPLANT, BILATERAL Bilateral 2009  . EP IMPLANTABLE DEVICE N/A 06/21/2016   Procedure: ICD Implant;  Surgeon: Evans Lance, MD;  Location: [place] [facility];  Service: Cardiovascular;  Laterality: N/A;  . FIXATION KYPHOPLASTY LUMBAR SPINE  2008  . FRACTURE SURGERY    . INTRAMEDULLARY (IM) NAIL INTERTROCHANTERIC Left 09/21/2014   Procedure: left hip intertroch fracture;  Surgeon: [name], MD;  Location: [facility];  Service: Orthopedics;  Laterality: Left;  . LOWER EXTREMITY VENOUS DOPPLER  09/04/2012   Mild valvular insufficiency in the R Common Femoral vein w/ 1.6 sec of duration of refliux  . TRANSTHORACIC ECHOCARDIOGRAM  09/04/2012   EF >55%, normal     Family History  Problem Relation Age of Onset  . Heart disease Mother   . Heart disease Father   . Heart disease Maternal Grandmother   . Heart disease Maternal Grandfather   . Stroke Paternal Grandmother   . Cancer Paternal Grandfather     Skin cancer  . Diabetes Brother      Social History   Social History  . Marital status: Widowed    Spouse name: N/A  . Number of children: N/A  . Years of education: N/A   Occupational History  . Not on file.   Social History Main Topics  . Smoking status: Never Smoker  . Smokeless tobacco: Never Used  . Alcohol use No  . Drug use: No  . Sexual activity: Yes   Other Topics Concern  . Not on file   Social History Narrative  . No narrative on file     BP (!) 156/88   Pulse 86   Ht 5\' 4"  (1.626 m)   Wt 291 lb 3.2 oz (132.1 kg)   SpO2 98%   BMI 49.98 kg/m   Physical Exam:  Obese appearing 70 yo man, NAD HEENT: Unremarkable Neck:  7 cm JVD, no thyromegally Lymphatics:  No adenopathy Back:  No CVA tenderness Lungs:  Clear with no wheezes, well healed ICD incision. HEART:  Regular rate rhythm, no murmurs, no rubs, no clicks Abd:  soft, positive bowel sounds, no organomegally, no rebound, no guarding Ext:  2 plus pulses, no edema, no cyanosis, no clubbing Skin:  No rashes no nodules Neuro:  CN II through XII intact, motor grossly intact  DEVICE  Normal device function.  See PaceArt for details.   Assess/Plan: 1. Chronic systolic heart failure - despite his obesity and LV dysfunction, he has maintained class 2 symptoms. Will follow. 2. Obesity - he has actually gained a couple of pounds. We discussed the importance of weight loss. 3. HTN - his blood pressure is up. I  have asked him to reduce his salt intake. He admits to dietary indiscretion. 4. ICD - his St. Jude device is working normally. Will follow.  Mikle Bosworth.D.

## 2016-10-05 NOTE — Patient Instructions (Addendum)
Medication Instructions:  Your physician recommends that you continue on your current medications as directed. Please refer to the Current Medication list given to you today.   Labwork: None Ordered   Testing/Procedures: None Ordered   Follow-Up: Your physician wants you to follow-up in: 9 months with Dr. Lovena Le. You will receive a reminder letter in the mail two months in advance. If you don't receive a letter, please call our office to schedule the follow-up appointment.  Remote monitoring is used to monitor your ICD from home. This monitoring reduces the number of office visits required to check your device to one time per year. It allows [place] to keep an eye on the functioning of your device to ensure it is working properly. You are scheduled for a device check from home on 01/04/17. You may send your transmission at any time that day. If you have a wireless device, the transmission will be sent automatically. After your physician reviews your transmission, you will receive a postcard with your next transmission date.     Any Other Special Instructions Will Be Listed Below (If Applicable).     If you need a refill on your cardiac medications before your next appointment, please call your pharmacy.

## 2016-11-10 ENCOUNTER — Telehealth: Payer: Self-pay | Admitting: Cardiology

## 2016-11-10 NOTE — Telephone Encounter (Addendum)
Informed patient that Dr.Hilty is agreeable with plan. Patient voiced understanding and plans to call PCP.

## 2016-11-10 NOTE — Telephone Encounter (Signed)
Thanks .. I agree with that. He should avoid decongestants, especially sudafed as it may cause tachcardia.  Dr. Lemmie Evens

## 2016-11-10 NOTE — Telephone Encounter (Signed)
Pt called and stated that he is having some discomfort in his chest. He doesn't really know how to explain it but he just doesn't feel right. Pt is experiencing intermittent shortness of breath. He seen Dr. Debara Pickett on 09-07-16 where the discussed weight gain. Phone call routed to device tech.

## 2016-11-10 NOTE — Telephone Encounter (Signed)
ICD transmission received. Presenting rhythm: Vs ~112bpm. No ventricular arrhythmias recorded. Stable thoracic impedance.  Patient states that he has had a cold x 2 weeks and has been treating his sx's with Robitussin DM. Patient states that he has noticed some ShOB since the cold, as well as an ocassional "light heaviness" in his chest at times. Patient denies any radiation or other sx's such as N/V.  I spoke to patient about the use of decongestants in cardiac patients. I told him that he should not use medications with decongestants in them, bc of the effect that it has on his HR. I told him that he should call his PCP to address his cold, especially since it has not gotten any better over the last 2 weeks.   I also spoke to patient about the job of his ICD. I told him that his device would show fast ventricular events, but would not be helpful with determining coronary status. I explained to him the signs that he would need to look of for and when he should seek emergency treatment.   Patient verbalized understanding of all information provided.   Will forward information to Dr.Hilty for further recommendations.

## 2016-12-22 ENCOUNTER — Telehealth: Payer: Self-pay | Admitting: Internal Medicine

## 2016-12-22 NOTE — Telephone Encounter (Signed)
Agree his pain does not sound cardiac. Would encourage compliance with diuretic  Janyra Barillas [place] MD, [facility]

## 2016-12-22 NOTE — Telephone Encounter (Signed)
Jeffery Nelson is calling because he is having some pains around his heart , Not chest pains per say . Please call

## 2016-12-22 NOTE — Telephone Encounter (Signed)
LM with MD advice.  

## 2016-12-22 NOTE — Telephone Encounter (Signed)
Returned call to patient He states he has chest discomfort that feels like gas pockets. He had this 3 weeks ago, took antiacids & it went away. Patient states the discomfort has returned - the pain in his chest is like a "gnawing" pain, sharper when he lies down and when he rolls over - it hurts. Pain is sometimes on side near his pancrease, sometimes "deep" Some nights he has to sleep in a chair related to pain associate with lying down (not r/t SOB) He has no more SOB than his baseline PCP thought it may be something with his ribs - no workup of this was done per patient  Patient states he has bad LE edema - about the same as it has been  Patient has not been as diligent about taking fluid pill - has not weighed self   Advised patient that his chest discomfort does not sound cardiac in nature but I would send the message to the MD to review and advise on if necessary. He voiced understanding & agreed with plan.

## 2017-01-04 ENCOUNTER — Ambulatory Visit (INDEPENDENT_AMBULATORY_CARE_PROVIDER_SITE_OTHER): Payer: Medicare Other | Admitting: *Deleted

## 2017-01-04 ENCOUNTER — Telehealth: Payer: Self-pay | Admitting: Cardiology

## 2017-01-04 DIAGNOSIS — I428 Other cardiomyopathies: Secondary | ICD-10-CM

## 2017-01-04 NOTE — Telephone Encounter (Signed)
Spoke with pt and reminded pt of remote transmission that is due today. Pt verbalized understanding.   

## 2017-01-07 NOTE — Progress Notes (Signed)
Remote ICD transmission.   

## 2017-01-08 LAB — CUP PACEART REMOTE DEVICE CHECK
Battery Remaining Percentage: 94 %
Brady Statistic RV Percent Paced: 1 %
HIGH POWER IMPEDANCE MEASURED VALUE: 81 Ohm
HIGH POWER IMPEDANCE MEASURED VALUE: 81 Ohm
Implantable Lead Implant Date: 20170703
Implantable Lead Location: 753860
Lead Channel Impedance Value: 530 Ohm
Lead Channel Pacing Threshold Amplitude: 1 V
Lead Channel Pacing Threshold Pulse Width: 0.5 ms
Lead Channel Setting Sensing Sensitivity: 0.5 mV
MDC IDC MSMT BATTERY REMAINING LONGEVITY: 96 mo
MDC IDC MSMT BATTERY VOLTAGE: 3.2 V
MDC IDC MSMT LEADCHNL RV SENSING INTR AMPL: 12 mV
MDC IDC PG IMPLANT DT: 20170703
MDC IDC PG SERIAL: 7372545
MDC IDC SESS DTM: 20180116215725
MDC IDC SET LEADCHNL RV PACING AMPLITUDE: 2.5 V
MDC IDC SET LEADCHNL RV PACING PULSEWIDTH: 0.5 ms

## 2017-01-12 ENCOUNTER — Telehealth: Payer: Self-pay | Admitting: Internal Medicine

## 2017-01-12 ENCOUNTER — Encounter: Payer: Self-pay | Admitting: Cardiology

## 2017-01-12 NOTE — Telephone Encounter (Signed)
New Message     Pt c/o swelling: STAT is pt has developed SOB within 24 hours  1. How long have you been experiencing swelling? Couple weeks   2. Where is the swelling located? Legs  And abdomin  3.  Are you currently taking a "fluid pill"? yes  4.  Are you currently SOB? Mild not major  5.  Have you traveled recently?no   Schedule pt with Eulas Post on 01/19/17

## 2017-01-12 NOTE — Telephone Encounter (Signed)
Returned call to patient.He stated he has been having increased swelling in abdomen and lower legs.No sob.Stated he would like to be seen sooner than appointment with PA on 01/19/17.Appointment rescheduled to tomorrow 01/13/17 with Rosaria Ferries PA at 8:30 am.

## 2017-01-13 ENCOUNTER — Encounter: Payer: Self-pay | Admitting: Physician Assistant

## 2017-01-13 ENCOUNTER — Ambulatory Visit (INDEPENDENT_AMBULATORY_CARE_PROVIDER_SITE_OTHER): Payer: Medicare Other | Admitting: Physician Assistant

## 2017-01-13 VITALS — BP 136/79 | HR 76 | Ht 64.0 in | Wt 301.4 lb

## 2017-01-13 DIAGNOSIS — R079 Chest pain, unspecified: Secondary | ICD-10-CM

## 2017-01-13 DIAGNOSIS — I10 Essential (primary) hypertension: Secondary | ICD-10-CM | POA: Diagnosis not present

## 2017-01-13 DIAGNOSIS — I5042 Chronic combined systolic (congestive) and diastolic (congestive) heart failure: Secondary | ICD-10-CM | POA: Diagnosis not present

## 2017-01-13 DIAGNOSIS — I428 Other cardiomyopathies: Secondary | ICD-10-CM

## 2017-01-13 DIAGNOSIS — Z79899 Other long term (current) drug therapy: Secondary | ICD-10-CM

## 2017-01-13 DIAGNOSIS — R0602 Shortness of breath: Secondary | ICD-10-CM

## 2017-01-13 DIAGNOSIS — G4719 Other hypersomnia: Secondary | ICD-10-CM

## 2017-01-13 DIAGNOSIS — R0683 Snoring: Secondary | ICD-10-CM

## 2017-01-13 LAB — BASIC METABOLIC PANEL
BUN: 29 mg/dL — AB (ref 7–25)
CHLORIDE: 102 mmol/L (ref 98–110)
CO2: 29 mmol/L (ref 20–31)
Calcium: 8.7 mg/dL (ref 8.6–10.3)
Creat: 0.77 mg/dL (ref 0.70–1.18)
Glucose, Bld: 100 mg/dL — ABNORMAL HIGH (ref 65–99)
POTASSIUM: 4.2 mmol/L (ref 3.5–5.3)
SODIUM: 138 mmol/L (ref 135–146)

## 2017-01-13 MED ORDER — CARVEDILOL 6.25 MG PO TABS: 9.3750 mg | ORAL_TABLET | Freq: Two times a day (BID) | ORAL | 6 refills | Status: DC

## 2017-01-13 NOTE — Progress Notes (Signed)
Cardiology Office Note   Date:  01/13/2017   ID:  Jeffery Nelson, Jeffery Nelson 21-Nov-1946, MRN [id number]  PCP:  Woody Seller, MD  Cardiologist:  Dr. Debara Pickett 09/07/2016.  EP: Dr Lovena Le 10/05/2016 Rosaria Ferries, PA-C   Chief Complaint  Patient presents with  . Follow-up  . Shortness of Breath    occassionally.  . Chest Pain    sharp.  . Leg Pain    pain and cramping in legs    History of Present Illness: Jeffery Nelson is a 71 y.o. male with a history of LE edema, S-D-CHF w/ EF 35-40% by echo 05/2016, HTN, NICM w/ D1 50% by cath 01/2016, DM, HLD, VT s/p SJM single lead ICD  [name] presents for cardiology evaluation.  Pt admits that he is not consistent with his Lasix or a low-sodium. He does get at least 1 dose of Lasix every day. He has not gotten the Zaroxolyn filled because his insurance will not cover it. He is tracking his weight sometimes, says it went up high but he has lost 6 lbs in the last week.   His legs have improved, they are not weeping any more.   His breathing is ok, he can walk (with a cane) a block or more.   He did not sleep well last pm, not related to breathing. Has not yet had sleep study, is willing to schedule.   He drinks a gallon of water or more daily. He prepares most of his food at home. 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   Sometimes when he turns over, he will get L lateral chest pain under his arm. It may last for a couple of days and then clear up. Pain pills can help. [name] can also help.   He has not had presyncope or syncope, he does get occasional palpitations, but is otherwise asymptomatic.   Past Medical History:  Diagnosis Date  . AICD (automatic cardioverter/defibrillator) present    a. 06/21/2016 SJM single lead AICD (ser # [id number]).  . Benign neoplasm of colon   . Chest pain    From rib fractures  . Chronic combined systolic and diastolic CHF (congestive heart failure) ([place])    a. 01/2016 Echo: EF 20-25%, diff  HK; b. 05/2016 Echo: EF 35-40%, diff HK, gr1 DD, diff HK, mild MR, mod dil LA/RA, mod TR, PASP 96mmHg.  [name] Gout   . Hypertensive heart disease with heart failure ([place])   . Lower extremity edema   . NICM (nonischemic cardiomyopathy) ([place])     01/2016 Echo: EF 20-25%, b. 01/2016 Cath: LM nl, LAD min irregs, D1 50ost, LCX min irregs, RCA min irregs, RPDA/RPL1 nl;  c. 05/2016 Echo: EF 35-40%;  d. 06/2016 s/p SJM single lead AICD (ser # [id number])..  . Non-obstructive CAD    a. 01/2016 Cath: LM nl, LAD min irregs, D1 50ost, LCX min irregs, RCA min irregs, RPDA/RPL1 nl.  . Polyneuropathy in diabetes(357.2)   . Psychosexual dysfunction with inhibited sexual excitement   . Pure hypercholesterolemia   . Restless legs syndrome (RLS)   . Sleep apnea    "probably" (06/21/2016)  . Sleep arousal disorder   . Syncope    a. Presumed to be 2/2 VT in setting of NICM-->s/p single lead SJM AICD in 06/2016.  . Type II diabetes mellitus ([place])   . Unspecified venous (peripheral) insufficiency     Past Surgical History:  Procedure Laterality Date  . CARDIAC CATHETERIZATION N/A 02/12/2016  Procedure: Right/Left Heart Cath and Coronary Angiography;  Surgeon: Pixie Casino, MD;  Location: [place] [facility];  Service: Cardiovascular;  Laterality: N/A;  . CATARACT EXTRACTION W/ INTRAOCULAR LENS  IMPLANT, BILATERAL Bilateral 2009  . EP IMPLANTABLE DEVICE N/A 06/21/2016   Procedure: ICD Implant;  Surgeon: Evans Lance, MD;  Location: [place] [facility];  Service: Cardiovascular;  Laterality: N/A;  . FIXATION KYPHOPLASTY LUMBAR SPINE  2008  . FRACTURE SURGERY    . INTRAMEDULLARY (IM) NAIL INTERTROCHANTERIC Left 09/21/2014   Procedure: left hip intertroch fracture;  Surgeon: [name], MD;  Location: [facility];  Service: Orthopedics;  Laterality: Left;  . LOWER EXTREMITY VENOUS DOPPLER  09/04/2012   Mild valvular insufficiency in the R Common Femoral vein w/ 1.6 sec of duration of refliux  . TRANSTHORACIC ECHOCARDIOGRAM   09/04/2012   EF >55%, normal    Current Outpatient Prescriptions  Medication Sig Dispense Refill  . aspirin EC 81 MG tablet Take 81 mg by mouth daily.    . carvedilol (COREG) 6.25 MG tablet Take 1 tablet (6.25 mg total) by mouth 2 (two) times daily. 180 tablet 1  . colchicine 0.6 MG tablet Take 0.6 mg by mouth as needed (for gout).     . fluocinonide cream (LIDEX) [id number] % Apply 1 application topically as needed (rash).    [name] FLUoxetine (PROZAC) 20 MG tablet Take 20 mg by mouth daily.    . furosemide (LASIX) 40 MG tablet Take 1 tablet (40 mg total) by mouth 2 (two) times daily. 60 tablet 6  . HUMALOG MIX 75/25 (75-25) 100 UNIT/ML SUSP injection Inject 55-70 Units as directed daily. Inject 70 units at breakfast and dinner, and 55 units at lunch time  2  . potassium chloride SA (K-DUR,KLOR-CON) 20 MEQ tablet Take 1 tablet (20 mEq total) by mouth as directed. (Patient taking differently: Take 20 mEq by mouth 2 (two) times daily. ) 90 tablet 3  . quinapril (ACCUPRIL) 20 MG tablet Take 20 mg by mouth 2 (two) times daily.    [name] rOPINIRole (REQUIP) 4 MG tablet Take 4 mg by mouth daily.     . rosuvastatin (CRESTOR) 20 MG tablet Take 20 mg by mouth daily.     No current facility-administered medications for this visit.     Allergies:   Lipitor [atorvastatin] and Lyrica [pregabalin]    Social History:  The patient  reports that he has never smoked. He has never used smokeless tobacco. He reports that he does not drink alcohol or use drugs.   Family History:  The patient's family history includes Cancer in his paternal grandfather; Diabetes in his brother; Heart disease in his father, maternal grandfather, maternal grandmother, and mother; Stroke in his paternal grandmother.    ROS:  Please see the history of present illness. All other systems are reviewed and negative.    PHYSICAL EXAM: VS:  BP 136/79   Pulse 76   Ht 5\' 4"  (1.626 m)   Wt (!) 301 lb 6.4 oz (136.7 kg)   BMI 51.74 kg/m  , BMI  Body mass index is 51.74 kg/m. GEN: Well nourished, well developed, male in no acute distress  HEENT: normal for age  Neck: Minimal JVD, no carotid bruit, no masses Cardiac: RRR; no murmur, no rubs, or gallops Respiratory:  Decreased breath sounds bases bilaterally, normal work of breathing GI: Obese, soft, nontender, nondistended, + BS MS: no deformity or atrophy; 1+ edema; distal pulses are 2+ in upper extremities, difficult  to palpate in the lower extremities due to edema Skin: warm and dry, no rash; thickened skin on both legs from chronic changes Neuro:  Strength and sensation are intact Psych: euthymic mood, full affect   EKG:  EKG is ordered today. The ekg ordered today demonstrates sinus rhythm, heart rate 76, no acute ischemic changes; lateral T waves are similar to July 2017 ECG   Recent Labs: 02/03/2016: ALT 13; Brain Natriuretic Peptide 294.6 02/04/2016: TSH 0.91 06/21/2016: Hemoglobin 13.0; Platelets 146 09/21/2016: BUN 22; Creat 0.84; Potassium 4.4; Sodium 140    Lipid Panel    Component Value Date/Time   CHOL 114 (L) 02/03/2016 1052   TRIG 99 02/03/2016 1052   HDL 42 02/03/2016 1052   CHOLHDL 2.7 02/03/2016 1052   VLDL 20 02/03/2016 1052   LDLCALC 52 02/03/2016 1052     Wt Readings from Last 3 Encounters:  01/13/17 (!) 301 lb 6.4 oz (136.7 kg)  10/05/16 291 lb 3.2 oz (132.1 kg)  09/21/16 287 lb (130.2 kg)     Other studies Reviewed: Additional studies/ records that were reviewed today include: Office notes, hospital records and testing.  ASSESSMENT AND PLAN:  1.  Chronic combined systolic and diastolic CHF: His weight is up 10 pounds from previous and he admits that he has not been compliant with a low-sodium or diabetic diet. He requests information on this and was given a print out. He is encouraged to continue to track daily weights. He is encouraged to limit water to 2 L daily. Since his Optivol settings are improved and he is not describing shortness of  breath, no med changes for now.  He believes he is only taking his quinapril once a day instead of twice a day as directed. He will check that and get back to [place]. As I would like to increase his rate a blocker, I will leave the quinapril at whatever dose he is currently taking.  2. Nonsustained VT: He had 2 self terminating episodes of NSVT recently, with different morphologies. Increase carvedilol. Check BMET  3. Compliance: He was requested to stick to an ADA 2000 mg sodium carb modified diet and limited fluids to 2 L daily. The importance of compliance with medications and dietary restrictions was emphasized.  4. Hyperlipidemia: It is been almost a year since his cholesterol was checked. He wishes to get it done through his family physician and thinks they follow her regularly. He is requested to get this done and then get [place] the records.  5. Diabetes: He admits that his A1c is generally 8 or more. He is encouraged to track this and compliance with a diabetic diet is encouraged.  6. Chest pain: His chest pain is atypical and he has had a negative workup in the past. No further evaluation is indicated at this time.  Current medicines are reviewed at length with the patient today.  The patient does not have concerns regarding medicines.  The following changes have been made:  Increase Coreg, clarify Accupril  Labs/ tests ordered today include:   Orders Placed This Encounter  Procedures  . Basic Metabolic Panel (BMET)  . EKG 12-Lead  . Split night study     Disposition:   FU with Dr. Debara Pickett  Signed, Rosaria Ferries, PA-C  01/13/2017 10:09 AM    [place] Phone: [phone]; Fax: [phone]  This note was written with the assistance of speech recognition software. Please excuse any transcriptional errors.

## 2017-01-13 NOTE — Patient Instructions (Addendum)
Medication Instructions:  INCREASE Coreg to 9.375 mg (1.5 tablets) two times a day.  CALL OUR OFFICE to confirm quanipril dose.  Labwork: Have lab work today-(BMET)  Have cholesterol levels checked with PCP and have them send our office a copy of the results.  Testing/Procedures: Your physician has recommended that you have a sleep study. This test records several body functions during sleep, including: brain activity, eye movement, oxygen and carbon dioxide blood levels, heart rate and rhythm, breathing rate and rhythm, the flow of air through your mouth and nose, snoring, body muscle movements, and chest and belly movement.   Follow-Up: Your physician wants you to follow-up in: 3 months with Dr. Towana Badger You will receive a reminder letter in the mail two months in advance. If you don't receive a letter, please call our office to schedule the follow-up appointment.   Any Other Special Instructions Will Be Listed Below (If Applicable).  Limit fluid intake to 2 liters daily.     If you need a refill on your cardiac medications before your next appointment, please call your pharmacy.   Low-Sodium Eating Plan Sodium raises blood pressure and causes water to be held in the body. Getting less sodium from food will help lower your blood pressure, reduce any swelling, and protect your heart, liver, and kidneys. We get sodium by adding salt (sodium chloride) to food. Most of our sodium comes from canned, boxed, and frozen foods. Restaurant foods, fast foods, and pizza are also very high in sodium. Even if you take medicine to lower your blood pressure or to reduce fluid in your body, getting less sodium from your food is important. What is my plan? Most people should limit their sodium intake to 2,300 mg a day. Your health care provider recommends that you limit your sodium intake to __________ a day. What do I need to know about this eating plan? For the low-sodium eating plan, you will follow  these general guidelines:  Choose foods with a % Daily Value for sodium of less than 5% (as listed on the food label).  Use salt-free seasonings or herbs instead of table salt or sea salt.  Check with your health care provider or pharmacist before using salt substitutes.  Eat fresh foods.  Eat more vegetables and fruits.  Limit canned vegetables. If you do use them, rinse them well to decrease the sodium.  Limit cheese to 1 oz (28 g) per day.  Eat lower-sodium products, often labeled as "lower sodium" or "no salt added."  Avoid foods that contain monosodium glutamate (MSG). MSG is sometimes added to [place] food and some canned foods.  Check food labels (Nutrition Facts labels) on foods to learn how much sodium is in one serving.  Eat more home-cooked food and less restaurant, buffet, and fast food.  When eating at a restaurant, ask that your food be prepared with less salt, or no salt if possible. How do I read food labels for sodium information? The Nutrition Facts label lists the amount of sodium in one serving of the food. If you eat more than one serving, you must multiply the listed amount of sodium by the number of servings. Food labels may also identify foods as:  Sodium free-Less than 5 mg in a serving.  Very low sodium-35 mg or less in a serving.  Low sodium-140 mg or less in a serving.  Light in sodium-50% less sodium in a serving. For example, if a food that usually has 300 mg of  sodium is changed to become light in sodium, it will have 150 mg of sodium.  Reduced sodium-25% less sodium in a serving. For example, if a food that usually has 400 mg of sodium is changed to reduced sodium, it will have 300 mg of sodium. What foods can I eat? Grains  Low-sodium cereals, including oats, puffed wheat and rice, and shredded wheat cereals. Low-sodium crackers. Unsalted rice and pasta. Lower-sodium bread. Vegetables  Frozen or fresh vegetables. Low-sodium or reduced-sodium  canned vegetables. Low-sodium or reduced-sodium tomato sauce and paste. Low-sodium or reduced-sodium tomato and vegetable juices. Fruits  Fresh, frozen, and canned fruit. Fruit juice. Meat and Other Protein Products  Low-sodium canned tuna and salmon. Fresh or frozen meat, poultry, seafood, and fish. Lamb. Unsalted nuts. Dried beans, peas, and lentils without added salt. Unsalted canned beans. Homemade soups without salt. Eggs. Dairy  Milk. Soy milk. Ricotta cheese. Low-sodium or reduced-sodium cheeses. Yogurt. Condiments  Fresh and dried herbs and spices. Salt-free seasonings. Onion and garlic powders. Low-sodium varieties of mustard and ketchup. Fresh or refrigerated horseradish. Lemon juice. Fats and Oils  Reduced-sodium salad dressings. Unsalted butter. Other  Unsalted popcorn and pretzels. The items listed above may not be a complete list of recommended foods or beverages. Contact your dietitian for more options.  What foods are not recommended? Grains  Instant hot cereals. Bread stuffing, pancake, and biscuit mixes. Croutons. Seasoned rice or pasta mixes. Noodle soup cups. Boxed or frozen macaroni and cheese. Self-rising flour. Regular salted crackers. Vegetables  Regular canned vegetables. Regular canned tomato sauce and paste. Regular tomato and vegetable juices. Frozen vegetables in sauces. Salted [place] fries. Olives. Angie Fava. Relishes. Sauerkraut. Salsa. Meat and Other Protein Products  Salted, canned, smoked, spiced, or pickled meats, seafood, or fish. Bacon, ham, sausage, hot dogs, corned beef, chipped beef, and packaged luncheon meats. Salt pork. Jerky. Pickled herring. Anchovies, regular canned tuna, and sardines. Salted nuts. Dairy  Processed cheese and cheese spreads. Cheese curds. Blue cheese and cottage cheese. Buttermilk. Condiments  Onion and garlic salt, seasoned salt, table salt, and sea salt. Canned and packaged gravies. Worcestershire sauce. Tartar sauce. Barbecue  sauce. Teriyaki sauce. Soy sauce, including reduced sodium. Steak sauce. Fish sauce. Oyster sauce. Cocktail sauce. Horseradish that you find on the shelf. Regular ketchup and mustard. Meat flavorings and tenderizers. Bouillon cubes. Hot sauce. Tabasco sauce. Marinades. Taco seasonings. Relishes. Fats and Oils  Regular salad dressings. Salted butter. Margarine. Ghee. Bacon fat. Other  Potato and tortilla chips. Corn chips and puffs. Salted popcorn and pretzels. Canned or dried soups. Pizza. Frozen entrees and pot pies. The items listed above may not be a complete list of foods and beverages to avoid. Contact your dietitian for more information.  This information is not intended to replace advice given to you by your health care provider. Make sure you discuss any questions you have with your health care provider. Document Released: 05/28/2002 Document Revised: 05/13/2016 Document Reviewed: 10/10/2013 Elsevier Interactive Patient Education  2017 [employer] Diabetes Mellitus and Food It is important for you to manage your blood sugar (glucose) level. Your blood glucose level can be greatly affected by what you eat. Eating healthier foods in the appropriate amounts throughout the day at about the same time each day will help you control your blood glucose level. It can also help slow or prevent worsening of your diabetes mellitus. Healthy eating may even help you improve the level of your blood pressure and reach or maintain a healthy weight. General recommendations  for healthful eating and cooking habits include:  Eating meals and snacks regularly. Avoid going long periods of time without eating to lose weight.  Eating a diet that consists mainly of plant-based foods, such as fruits, vegetables, nuts, legumes, and whole grains.  Using low-heat cooking methods, such as baking, instead of high-heat cooking methods, such as deep frying. Work with your dietitian to make sure you understand how to use  the Nutrition Facts information on food labels. How can food affect me? Carbohydrates  Carbohydrates affect your blood glucose level more than any other type of food. Your dietitian will help you determine how many carbohydrates to eat at each meal and teach you how to count carbohydrates. Counting carbohydrates is important to keep your blood glucose at a healthy level, especially if you are using insulin or taking certain medicines for diabetes mellitus. Alcohol  Alcohol can cause sudden decreases in blood glucose (hypoglycemia), especially if you use insulin or take certain medicines for diabetes mellitus. Hypoglycemia can be a life-threatening condition. Symptoms of hypoglycemia (sleepiness, dizziness, and disorientation) are similar to symptoms of having too much alcohol. If your health care provider has given you approval to drink alcohol, do so in moderation and use the following guidelines:  Women should not have more than one drink per day, and men should not have more than two drinks per day. One drink is equal to:  12 oz of beer.  5 oz of wine.  1 oz of hard liquor.  Do not drink on an empty stomach.  Keep yourself hydrated. Have water, diet soda, or unsweetened iced tea.  Regular soda, juice, and other mixers might contain a lot of carbohydrates and should be counted. What foods are not recommended? As you make food choices, it is important to remember that all foods are not the same. Some foods have fewer nutrients per serving than other foods, even though they might have the same number of calories or carbohydrates. It is difficult to get your body what it needs when you eat foods with fewer nutrients. Examples of foods that you should avoid that are high in calories and carbohydrates but low in nutrients include:  Trans fats (most processed foods list trans fats on the Nutrition Facts label).  Regular soda.  Juice.  Candy.  Sweets, such as cake, pie, doughnuts, and  cookies.  Fried foods. What foods can I eat? Eat nutrient-rich foods, which will nourish your body and keep you healthy. The food you should eat also will depend on several factors, including:  The calories you need.  The medicines you take.  Your weight.  Your blood glucose level.  Your blood pressure level.  Your cholesterol level. You should eat a variety of foods, including:  Protein.  Lean cuts of meat.  Proteins low in saturated fats, such as fish, egg whites, and beans. Avoid processed meats.  Fruits and vegetables.  Fruits and vegetables that may help control blood glucose levels, such as apples, mangoes, and yams.  Dairy products.  Choose fat-free or low-fat dairy products, such as milk, yogurt, and cheese.  Grains, bread, pasta, and rice.  Choose whole grain products, such as multigrain bread, whole oats, and brown rice. These foods may help control blood pressure.  Fats.  Foods containing healthful fats, such as nuts, avocado, olive oil, canola oil, and fish. Does everyone with diabetes mellitus have the same meal plan? Because every person with diabetes mellitus is different, there is not one meal plan that  works for everyone. It is very important that you meet with a dietitian who will help you create a meal plan that is just right for you. This information is not intended to replace advice given to you by your health care provider. Make sure you discuss any questions you have with your health care provider. Document Released: 09/02/2005 Document Revised: 05/13/2016 Document Reviewed: 11/02/2013 Elsevier Interactive Patient Education  2017 [employer].

## 2017-01-18 ENCOUNTER — Telehealth: Payer: Self-pay | Admitting: Internal Medicine

## 2017-01-18 NOTE — Telephone Encounter (Signed)
Per result note: Patient of Dr. Debara Pickett Please let him know his BMET is good. His electrolytes are fine. Clarify quinapril dose, is he taking it once or twice a day? Continue taking it however he is taking and we just need to clarify our records. Current compliant with a low-sodium diabetic diet and daily weights as well as medications. Follow-up as scheduled Thanks  Pt notified of above lab result, pt states that he has been taking the quinapril 20mg  BID for 5 days. Will continue as discussed

## 2017-01-18 NOTE — Telephone Encounter (Signed)
Thank you :)

## 2017-01-18 NOTE — Telephone Encounter (Signed)
F/u Message ° °Pt returning RN call. Please call back to discuss  °

## 2017-01-19 ENCOUNTER — Ambulatory Visit: Payer: Medicare Other | Admitting: Physician Assistant

## 2017-01-26 ENCOUNTER — Encounter: Payer: Self-pay | Admitting: Cardiology

## 2017-02-08 ENCOUNTER — Ambulatory Visit: Payer: Medicare Other | Admitting: Internal Medicine

## 2017-03-27 ENCOUNTER — Other Ambulatory Visit: Payer: Self-pay | Admitting: Internal Medicine

## 2017-04-01 ENCOUNTER — Ambulatory Visit (INDEPENDENT_AMBULATORY_CARE_PROVIDER_SITE_OTHER): Payer: Medicare Other | Admitting: Internal Medicine

## 2017-04-01 ENCOUNTER — Encounter: Payer: Self-pay | Admitting: Internal Medicine

## 2017-04-01 VITALS — BP 124/80 | HR 74 | Ht 64.0 in | Wt 301.0 lb

## 2017-04-01 DIAGNOSIS — Z79899 Other long term (current) drug therapy: Secondary | ICD-10-CM

## 2017-04-01 DIAGNOSIS — I5043 Acute on chronic combined systolic (congestive) and diastolic (congestive) heart failure: Secondary | ICD-10-CM | POA: Diagnosis not present

## 2017-04-01 DIAGNOSIS — M7989 Other specified soft tissue disorders: Secondary | ICD-10-CM

## 2017-04-01 DIAGNOSIS — I5042 Chronic combined systolic (congestive) and diastolic (congestive) heart failure: Secondary | ICD-10-CM

## 2017-04-01 DIAGNOSIS — I428 Other cardiomyopathies: Secondary | ICD-10-CM

## 2017-04-01 HISTORY — DX: Other long term (current) drug therapy: Z79.899

## 2017-04-01 MED ORDER — FUROSEMIDE 40 MG PO TABS: 80.0000 mg | ORAL_TABLET | Freq: Two times a day (BID) | ORAL | 6 refills | Status: DC

## 2017-04-01 NOTE — Patient Instructions (Signed)
INCREASE lasix to 80mg  twice daily  Your physician recommends that you return for lab work in: BMET, BNP  Your physician recommends that you schedule a follow-up appointment in 1-2 weeks with Dr. Debara Pickett or PA/NP

## 2017-04-01 NOTE — Progress Notes (Signed)
OFFICE NOTE  Chief Complaint:  Follow-up heart failure  Primary Care Physician: Woody Seller, MD  HPI:  Jeffery Nelson is a 71 year old gentleman referred to me for lower extremity edema and shortness of breath. As you know, he is morbidly obese, almost super morbidly obese with a weight of 330 pounds, BMI of 54. He underwent an echocardiogram which demonstrated mild concentric LVH, EF greater than 55%. The left atrium was mildly dilated. However, there were no significant valvular abnormalities. Pulmonary pressures were not elevated. He also underwent lower extremity Dopplers looking for venous insufficiency. There was a small amount of reflux in the right common femoral vein, which is a deep vein. However, the superficial veins were negative for reflux or treatable venous insufficiency. I reviewed the results with the patient today and I feel that his best option is to continue with lower extremity compression stockings and work on weight loss. He is also at high risk for sleep apnea and I have referred him for a sleep study - unfortunately he never made this appointment.  Currently has no other symptoms other than he related a recent bout of shingles that he underwent. This has since resolved and he was not left with any chronic pain.  I saw Jeffery Nelson back in the office today. Overall he is doing fairly well. He had left hip replacement placement recently due to what sound like a spontaneous fracture. Apparently he has of some degree of osteopenia. He was supposed been medicine for that but is not taking it. Fortunately has had about 30-35 pound weight loss and although he never got his foot split-night sleep study, he denies any new apnea symptoms and feels like he is rested during the day. He has been getting a little more shortness of breath mostly when laying down but that is gotten better and he has no new complaints today.  Jeffery Nelson returns today in the office. He reports that  he's recently had some weight gain and lower extremity swelling. He says it is also has some shortness of breath with exertion. Swelling seems to be a little worse in the right leg than the left. He reports she's been compliant with his current dose of Lasix. He is also taking over-the-counter potassium supplements. He denies any chest pain.  I saw Jeffery Nelson back today in follow-up. He reports he still persistently short of breath. I asked him to increase his Lasix and prescribe some potassium however he said he didn't have the money to get potassium and therefore was nervous about taking extra Lasix so he never increased the dose the medicine. In the interim though he's gained about 6 pounds. He does have lower strandy swelling and persistent shortness of breath. His echocardiogram unfortunate shows a new cardiomyopathy with an EF of 20-25% with global hypokinesis which is severe. Previous echo at least a year ago showed an EF which was normal. He denies any chest pain episodes or recent significant viral illnesses that might explain his reduced LV function. I'm surly concerned about coronary artery disease. He also denies any alcohol or drug use.  Jeffery Nelson returns today for follow-up of his heart catheterization, the results are as follows:   Ost 1st Diag to 1st Diag lesion, 50% stenosed.  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 Moderate pulmonary venous hypertension, reduced cardiac index. Elevated PCWP. Will need additional diuresis and medication adjustment for  non-ischemic cardiomyopathy as an outpatient. I recommended increasing his Lasix to 40 mg twice a day. Since that change she's had significant weight loss. Weight today is 258, which is down from 281. He said no, locations from his radial catheterization site. As mentioned, the plan is to titrate his medical  therapy. He reports his shortness of breath is completely resolved and his leg swelling is improved significantly.  06/03/2016  Jeffery Nelson returns today for follow-up. He has gained about 15 pounds. There is some extra edema but mostly this is weight gain not related to heart failure. However he is not as compliant with twice-daily Lasix. He denies any worsening shortness of breath. He had another episode of syncope which was witnessed. He became apparently pale and unresponsive. By the time EMS arrived he was doing well and declined transport to the hospital. He's had 3 episodes in about 6 months. This was the first episode that was witnessed. I'm concerned about a possible arrhythmia.  09/07/2016  Jeffery Nelson seen today in follow-up. He reports little improvement with increasing his Lasix. Weight is now up to 301 pounds, from 276 lbs approximately 3 months ago. He reports significant lower extremity swelling and weeping edema. 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 He has had no complications from that AICD.  04/01/2017  Jeffery Nelson returns today for follow-up. Again there is been some lack of compliance with medications. He's recently started having some weeping edema again of the right lower extremity. This is been wrapped by his primary care provider. He reports it is only taking Lasix 40 mg twice a day not 80 mg twice a day as previously prescribed. He was also on metolazone but he said he could not afford that as it was not covered. Weight is now back to 301 pounds which it was in September however he did come down significantly to 291 pounds in October.  PMHx:  Past Medical History:  Diagnosis Date  . AICD (automatic cardioverter/defibrillator) present    a. 06/21/2016 SJM single lead AICD (ser # [id number]).  . Benign neoplasm of colon   . Chest pain    From rib fractures  . Chronic combined systolic and diastolic  CHF (congestive heart failure) ([place])    a. 01/2016 Echo: EF 20-25%, diff HK; b. 05/2016 Echo: EF 35-40%, diff HK, gr1 DD, diff HK, mild MR, mod dil LA/RA, mod TR, PASP 48mmHg.  [name] Gout   . Hypertensive heart disease with heart failure ([place])   . Lower extremity edema   . NICM (nonischemic cardiomyopathy) ([place])     01/2016 Echo: EF 20-25%, b. 01/2016 Cath: LM nl, LAD min irregs, D1 50ost, LCX min irregs, RCA min irregs, RPDA/RPL1 nl;  c. 05/2016 Echo: EF 35-40%;  d. 06/2016 s/p SJM single lead AICD (ser # [id number])..  . Non-obstructive CAD    a. 01/2016 Cath: LM nl, LAD min irregs, D1 50ost, LCX min irregs, RCA min irregs, RPDA/RPL1 nl.  . Polyneuropathy in diabetes(357.2)   . Psychosexual dysfunction with inhibited sexual excitement   . Pure hypercholesterolemia   . Restless legs syndrome (RLS)   . Sleep apnea    "probably" (06/21/2016)  . Sleep arousal disorder   . Syncope    a. Presumed to be 2/2 VT in setting of NICM-->s/p single lead SJM AICD in 06/2016.  . Type II diabetes mellitus ([place])   . Unspecified venous (peripheral) insufficiency  Past Surgical History:  Procedure Laterality Date  . CARDIAC CATHETERIZATION N/A 02/12/2016   Procedure: Right/Left Heart Cath and Coronary Angiography;  Surgeon: Pixie Casino, MD;  Location: [facility];  Service: Cardiovascular;  Laterality: N/A;  . CATARACT EXTRACTION W/ INTRAOCULAR LENS  IMPLANT, BILATERAL Bilateral 2009  . EP IMPLANTABLE DEVICE N/A 06/21/2016   Procedure: ICD Implant;  Surgeon: Evans Lance, MD;  Location: [facility];  Service: Cardiovascular;  Laterality: N/A;  . FIXATION KYPHOPLASTY LUMBAR SPINE  2008  . FRACTURE SURGERY    . INTRAMEDULLARY (IM) NAIL INTERTROCHANTERIC Left 09/21/2014   Procedure: left hip intertroch fracture;  Surgeon: [name], MD;  Location: [facility];  Service: Orthopedics;  Laterality: Left;  . LOWER EXTREMITY VENOUS DOPPLER  09/04/2012   Mild valvular insufficiency in the R Common Femoral vein  w/ 1.6 sec of duration of refliux  . TRANSTHORACIC ECHOCARDIOGRAM  09/04/2012   EF >55%, normal    FAMHx:  Family History  Problem Relation Age of Onset  . Heart disease Mother   . Heart disease Father   . Heart disease Maternal Grandmother   . Heart disease Maternal Grandfather   . Stroke Paternal Grandmother   . Cancer Paternal Grandfather     Skin cancer  . Diabetes Brother     SOCHx:   reports that he has never smoked. He has never used smokeless tobacco. He reports that he does not drink alcohol or use drugs.  ALLERGIES:  Allergies  Allergen Reactions  . Lipitor [Atorvastatin] Swelling  . Lyrica [Pregabalin] Swelling    ROS: Pertinent items noted in HPI and remainder of comprehensive ROS otherwise negative.  HOME MEDS: Current Outpatient Prescriptions  Medication Sig Dispense Refill  . aspirin EC 81 MG tablet Take 81 mg by mouth daily.    . carvedilol (COREG) 6.25 MG tablet Take 1.5 tablets (9.375 mg total) by mouth 2 (two) times daily. 90 tablet 6  . cephALEXin (KEFLEX) 500 MG capsule Take 1 capsule by mouth 3 (three) times daily.    . colchicine 0.6 MG tablet Take 0.6 mg by mouth as needed (for gout).     . fluocinonide cream (LIDEX) 1.44 % Apply 1 application topically as needed (rash).    [name] FLUoxetine (PROZAC) 20 MG tablet Take 20 mg by mouth daily.    . furosemide (LASIX) 40 MG tablet Take 1 tablet (40 mg total) by mouth 2 (two) times daily. 60 tablet 6  . furosemide (LASIX) 40 MG tablet TAKE TWO TABLETS BY MOUTH TWICE DAILY 60 tablet 6  . HUMALOG MIX 75/25 (75-25) 100 UNIT/ML SUSP injection Inject 55-70 Units as directed daily. Inject 70 units at breakfast and dinner, and 55 units at lunch time  2  . potassium chloride SA (K-DUR,KLOR-CON) 20 MEQ tablet Take 1 tablet (20 mEq total) by mouth as directed. (Patient taking differently: Take 20 mEq by mouth 2 (two) times daily. ) 90 tablet 3  . quinapril (ACCUPRIL) 20 MG tablet Take 20 mg by mouth 2 (two) times daily.     [name] rOPINIRole (REQUIP) 4 MG tablet Take 4 mg by mouth daily.     . rosuvastatin (CRESTOR) 20 MG tablet Take 20 mg by mouth daily.     No current facility-administered medications for this visit.     LABS/IMAGING: No results found for this or any previous visit (from the past 48 hour(s)). No results found.  VITALS: BP 124/80 (BP Location: Left Arm, Patient Position: Sitting, Cuff  Size: Large)   Pulse 74   Ht 5\' 4"  (1.626 m)   Wt (!) 301 lb (136.5 kg)   BMI 51.67 kg/m   EXAM: General appearance: alert and no distress Lungs: clear to auscultation bilaterally Heart: regular rate and rhythm, S1, S2 normal, no murmur, click, rub or gallop Extremities: weeeping 3+ bilateral edema - compression stockings in place Skin: Skin color, texture, turgor normal. No rashes or lesions Neurologic: Grossly normal  EKG: Normal sinus rhythm 74, LVH with repolarization abnormality  ASSESSMENT: 1. New systolic congestive heart failure with EF 20-25% - nonischemic cardiomyopathy with mild CAD 2. Dyspnea, weight gain, leg swelling 3. Morbid obesity 4. Bilateral deep vein reflux with edema, right greater than left 5. Probable obstructive sleep apnea - awaiting sleep study 6. Recent total L hip arthroplasty 7. 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   Jeffery Nelson again presents with volume overload and lower extremity weeping edema. Weight is back up to 3 and 1 pounds however was 10 pounds lighter in October. We again need to increase diuresis. He's been noncompliant with the dose that was recommended. He says he cannot get metolazone due to cost issues. We'll go ahead and increase Lasix up to 80 mg twice a day. Repeat a metabolic profile and BMP next week and follow-up with [place] in 1 week's time to see if he's diureses adequately.  Pixie Casino, MD, [facility] Attending Cardiologist [place] Hilty 04/01/2017, 10:11 AM

## 2017-04-02 ENCOUNTER — Other Ambulatory Visit: Payer: Self-pay | Admitting: Internal Medicine

## 2017-04-05 ENCOUNTER — Ambulatory Visit (INDEPENDENT_AMBULATORY_CARE_PROVIDER_SITE_OTHER): Payer: Medicare Other | Admitting: *Deleted

## 2017-04-05 DIAGNOSIS — I428 Other cardiomyopathies: Secondary | ICD-10-CM | POA: Diagnosis not present

## 2017-04-05 NOTE — Progress Notes (Signed)
Remote ICD transmission.   

## 2017-04-07 LAB — CUP PACEART REMOTE DEVICE CHECK
Battery Remaining Longevity: 95 mo
Brady Statistic RV Percent Paced: 1 %
Date Time Interrogation Session: 20180417135508
HIGH POWER IMPEDANCE MEASURED VALUE: 86 Ohm
HighPow Impedance: 86 Ohm
Implantable Lead Implant Date: 20170703
Implantable Lead Location: 753860
Implantable Lead Model: 7122
Lead Channel Pacing Threshold Amplitude: 1 V
Lead Channel Pacing Threshold Pulse Width: 0.5 ms
Lead Channel Setting Pacing Pulse Width: 0.5 ms
MDC IDC MSMT BATTERY REMAINING PERCENTAGE: 92 %
MDC IDC MSMT BATTERY VOLTAGE: 3.17 V
MDC IDC MSMT LEADCHNL RV IMPEDANCE VALUE: 540 Ohm
MDC IDC MSMT LEADCHNL RV SENSING INTR AMPL: 12 mV
MDC IDC PG IMPLANT DT: 20170703
MDC IDC PG SERIAL: 7372545
MDC IDC SET LEADCHNL RV PACING AMPLITUDE: 2.5 V
MDC IDC SET LEADCHNL RV SENSING SENSITIVITY: 0.5 mV

## 2017-04-08 ENCOUNTER — Encounter: Payer: Self-pay | Admitting: Cardiology

## 2017-04-08 ENCOUNTER — Other Ambulatory Visit: Payer: Self-pay

## 2017-04-08 MED ORDER — POTASSIUM CHLORIDE CRYS ER 20 MEQ PO TBCR: 20.0000 meq | EXTENDED_RELEASE_TABLET | Freq: Two times a day (BID) | ORAL | 3 refills | Status: DC

## 2017-04-08 NOTE — Telephone Encounter (Signed)
Rx(s) sent to pharmacy electronically.  

## 2017-04-14 LAB — BASIC METABOLIC PANEL
BUN: 19 mg/dL (ref 7–25)
CO2: 31 mmol/L (ref 20–31)
Calcium: 8.5 mg/dL — ABNORMAL LOW (ref 8.6–10.3)
Chloride: 95 mmol/L — ABNORMAL LOW (ref 98–110)
Creat: 0.89 mg/dL (ref 0.70–1.18)
Glucose, Bld: 222 mg/dL — ABNORMAL HIGH (ref 65–99)
POTASSIUM: 4.5 mmol/L (ref 3.5–5.3)
Sodium: 137 mmol/L (ref 135–146)

## 2017-04-14 LAB — BRAIN NATRIURETIC PEPTIDE: Brain Natriuretic Peptide: 108.8 pg/mL — ABNORMAL HIGH (ref <100)

## 2017-04-17 NOTE — Progress Notes (Signed)
Cardiology Office Note    Date:  04/18/2017   ID:  Jeffery, Nelson August 21, 1946, MRN [id number]  PCP:  Woody Seller, MD  Cardiologist: Dr. Debara Pickett   Chief Complaint  Patient presents with  . Follow-up    2 weeks    History of Present Illness:    Jeffery Nelson is a 71 y.o. male with past medical history of nonischemic cardiomyopathy (s/p AICD placement in 06/2016), chronic combined systolic and diastolic CHF (EF previously 20%, improved to 35-40% by echo in 05/2016), HTN, HLD, and morbid obesity who presents to the office today for 2-week follow-up.   He was recently examined by Dr. Debara Pickett on 04/01/2017 and reported worsening weeping lower extremity edema in the setting of medication noncompliance as he reported taking Lasix 40mg  BID instead of 80mg  BID. Unable to afford Metolazone as it was not covered by his insurance. Weight was at 301 lbs, up from 291 lbs in 09/2016, therefore he was instructed to increase his Lasix to the originally prescribed dose of 80mg  BID. BMET recently checked on 4/25 and showed a stable creatinine of 0.89 and K+ at 4.5. BNP only minimally elevated at 108.  In talking with the patient today, he reports significant improvement in his dyspnea and lower extremity edema since his last office visit. Weight is down 22 pounds, at 279 pounds today. Reports similar weights on his home scales. Reports frequent urination with this current Lasix dosing. He denies any recent chest discomfort, palpitations, orthopnea, or PND.  BP is elevated to 166/79 this morning, at 156/84 on recheck. He reports having not yet taken his morning medications.   Past Medical History:  Diagnosis Date  . AICD (automatic cardioverter/defibrillator) present    a. 06/21/2016 SJM single lead AICD (ser # [id number]).  . Benign neoplasm of colon   . Chest pain    From rib fractures  . Chronic combined systolic and diastolic CHF (congestive heart failure) ([place])    a. 01/2016 Echo: EF 20-25%,  diff HK; b. 05/2016 Echo: EF 35-40%, diff HK, gr1 DD, diff HK, mild MR, mod dil LA/RA, mod TR, PASP 58mmHg.  [name] Gout   . Hypertensive heart disease with heart failure ([place])   . Lower extremity edema   . NICM (nonischemic cardiomyopathy) ([place])     01/2016 Echo: EF 20-25%, b. 01/2016 Cath: LM nl, LAD min irregs, D1 50ost, LCX min irregs, RCA min irregs, RPDA/RPL1 nl;  c. 05/2016 Echo: EF 35-40%;  d. 06/2016 s/p SJM single lead AICD (ser # [id number])..  . Non-obstructive CAD    a. 01/2016 Cath: LM nl, LAD min irregs, D1 50ost, LCX min irregs, RCA min irregs, RPDA/RPL1 nl.  . Polyneuropathy in diabetes(357.2)   . Psychosexual dysfunction with inhibited sexual excitement   . Pure hypercholesterolemia   . Restless legs syndrome (RLS)   . Sleep apnea    "probably" (06/21/2016)  . Sleep arousal disorder   . Syncope    a. Presumed to be 2/2 VT in setting of NICM-->s/p single lead SJM AICD in 06/2016.  . Type II diabetes mellitus ([place])   . Unspecified venous (peripheral) insufficiency     Past Surgical History:  Procedure Laterality Date  . CARDIAC CATHETERIZATION N/A 02/12/2016   Procedure: Right/Left Heart Cath and Coronary Angiography;  Surgeon: Pixie Casino, MD;  Location: [facility];  Service: Cardiovascular;  Laterality: N/A;  . CATARACT EXTRACTION W/ INTRAOCULAR LENS  IMPLANT, BILATERAL Bilateral 2009  . EP IMPLANTABLE DEVICE  N/A 06/21/2016   Procedure: ICD Implant;  Surgeon: Evans Lance, MD;  Location: [facility];  Service: Cardiovascular;  Laterality: N/A;  . FIXATION KYPHOPLASTY LUMBAR SPINE  2008  . FRACTURE SURGERY    . INTRAMEDULLARY (IM) NAIL INTERTROCHANTERIC Left 09/21/2014   Procedure: left hip intertroch fracture;  Surgeon: [name], MD;  Location: [facility];  Service: Orthopedics;  Laterality: Left;  . LOWER EXTREMITY VENOUS DOPPLER  09/04/2012   Mild valvular insufficiency in the R Common Femoral vein w/ 1.6 sec of duration of refliux  . TRANSTHORACIC ECHOCARDIOGRAM   09/04/2012   EF >55%, normal    Current Medications: Outpatient Medications Prior to Visit  Medication Sig Dispense Refill  . aspirin EC 81 MG tablet Take 81 mg by mouth daily.    . carvedilol (COREG) 6.25 MG tablet Take 1.5 tablets (9.375 mg total) by mouth 2 (two) times daily. 90 tablet 6  . cephALEXin (KEFLEX) 500 MG capsule Take 1 capsule by mouth 3 (three) times daily.    . colchicine 0.6 MG tablet Take 0.6 mg by mouth as needed (for gout).     . fluocinonide cream (LIDEX) 1.32 % Apply 1 application topically as needed (rash).    [name] FLUoxetine (PROZAC) 20 MG tablet Take 20 mg by mouth daily.    [name] HUMALOG MIX 75/25 (75-25) 100 UNIT/ML SUSP injection Inject 55-70 Units as directed daily. Inject 70 units at breakfast and dinner, and 55 units at lunch time  2  . potassium chloride SA (KLOR-CON M20) 20 MEQ tablet Take 1 tablet (20 mEq total) by mouth 2 (two) times daily. 180 tablet 3  . quinapril (ACCUPRIL) 20 MG tablet Take 20 mg by mouth 2 (two) times daily.    [name] rOPINIRole (REQUIP) 4 MG tablet Take 4 mg by mouth daily.     . rosuvastatin (CRESTOR) 20 MG tablet Take 20 mg by mouth daily.    . furosemide (LASIX) 40 MG tablet Take 2 tablets (80 mg total) by mouth 2 (two) times daily. 120 tablet 6   No facility-administered medications prior to visit.      Allergies:   Doxycycline; Lipitor [atorvastatin]; and Lyrica [pregabalin]   Social History   Social History  . Marital status: Widowed    Spouse name: N/A  . Number of children: N/A  . Years of education: N/A   Social History Main Topics  . Smoking status: Never Smoker  . Smokeless tobacco: Never Used  . Alcohol use No  . Drug use: No  . Sexual activity: Yes   Other Topics Concern  . Not on file   Social History Narrative  . No narrative on file     Family History:  The patient's family history includes Cancer in his paternal grandfather; Diabetes in his brother; Heart disease in his father, maternal grandfather,  maternal grandmother, and mother; Stroke in his paternal grandmother.   Review of Systems:   Please see the history of present illness.     General:  No chills, fever, night sweats or weight changes.  Cardiovascular:  No chest pain, dyspnea on exertion, orthopnea, palpitations, paroxysmal nocturnal dyspnea. Positive for edema.  Dermatological: No rash, lesions/masses Respiratory: No cough, dyspnea Urologic: No hematuria, dysuria Abdominal:   No nausea, vomiting, diarrhea, bright red blood per rectum, melena, or hematemesis Neurologic:  No visual changes, wkns, changes in mental status. All other systems reviewed and are otherwise negative except as noted above.   Physical Exam:  VS:  BP (!) 166/79   Pulse 63   Ht 5\' 4"  (1.626 m)   Wt 279 lb (126.6 kg)   BMI 47.89 kg/m    General: Well developed, obese Caucasian male appearing in no acute distress. Head: Normocephalic, atraumatic, sclera non-icteric, no xanthomas, nares are without discharge.  Neck: No carotid bruits. JVD difficult to assess secondary to body habitus. Lungs: Respirations regular and unlabored, without wheezes or rales.  Heart: Regular rate and rhythm. No S3 or S4.  No murmur, no rubs, or gallops appreciated. Abdomen: Soft, non-tender, non-distended with normoactive bowel sounds. No hepatomegaly. No rebound/guarding. No obvious abdominal masses. Msk:  Strength and tone appear normal for age. No joint deformities or effusions. Extremities: No clubbing or cyanosis. 1+ pitting edema on right, trace along left lower extremity.  Distal pedal pulses are 2+ bilaterally. Neuro: Alert and oriented X 3. Moves all extremities spontaneously. No focal deficits noted. Psych:  Responds to questions appropriately with a normal affect. Skin: No rashes or lesions noted  Wt Readings from Last 3 Encounters:  04/18/17 279 lb (126.6 kg)  04/01/17 (!) 301 lb (136.5 kg)  01/13/17 (!) 301 lb 6.4 oz (136.7 kg)     Studies/Labs  Reviewed:   EKG:  EKG is not ordered today.   Recent Labs: 06/21/2016: Hemoglobin 13.0; Platelets 146 04/13/2017: Brain Natriuretic Peptide 108.8; BUN 19; Creat 0.89; Potassium 4.5; Sodium 137   Lipid Panel    Component Value Date/Time   CHOL 114 (L) 02/03/2016 1052   TRIG 99 02/03/2016 1052   HDL 42 02/03/2016 1052   CHOLHDL 2.7 02/03/2016 1052   VLDL 20 02/03/2016 1052   LDLCALC 52 02/03/2016 1052    Additional studies/ records that were reviewed today include:   Echocardiogram: 05/2016 Study Conclusions  - Left ventricle: The cavity size was normal. Wall thickness was   increased in a pattern of mild LVH. Systolic function was   moderately reduced. The estimated ejection fraction was in the   range of 35% to 40%. Diffuse hypokinesis. Doppler parameters are   consistent with abnormal left ventricular relaxation (grade 1   diastolic dysfunction). The E/e&' ratio is >15, suggesting   elevated LV filling pressure. Ejection fraction (MOD, 2-plane):   38%. - Mitral valve: Mildly thickened leaflets . There was mild   regurgitation. - Left atrium: Moderately dilated. - Right ventricle: The cavity size was normal. Systolic function   was normal. Lateral annulus peak S velocity: 17.2 cm/s. - Right atrium: The atrium was mildly dilated. - Tricuspid valve: There was moderate regurgitation. - Pulmonary arteries: PA peak pressure: 40 mm Hg (S). - Inferior vena cava: The vessel was normal in size. The   respirophasic diameter changes were in the normal range (>= 50%),   consistent with normal central venous pressure.  Impressions:  - Compared to the prior study in 01/2016, the LVEF has improved to   35-40%.  Assessment:    1. Chronic combined systolic and diastolic CHF, NYHA class 2 ([place])   2. NICM (nonischemic cardiomyopathy) ([place])   3. Essential hypertension   4. Mixed hyperlipidemia      Plan:   In order of problems listed above:  1. Chronic Combined Systolic and  Diastolic CHF - EF previously 20%, improved to 35-40% by echo in 05/2016.  - was seen in the office on 4/13 and noted to be volume overloaded with weight up to 301 lbs. Lasix was increased from 40mg  BID to 80mg  BID. At today's visit,  weight is down 22 pounds, at 279 lbs. He reports significant improvement in his symptoms. Has trace edema on examination. Lungs are clear.  - will reduce Lasix dosing to 80mg  in AM and 40mg  in PM. Instructed to take an additional tablet in the evening for weight gain > 3 lbs overnight or > 5 lbs in 1 week. Continue BB and ACE-I.   2. Nonischemic cardiomyopathy - s/p AICD placement in 06/2016 - followed by Dr. Lovena Le with recent device check on 4/19 showing normal device function and stable battery life.   3. HTN - BP initially elevated at 166/79, at 156/84 on recheck. Reports he has not yet taken his morning medications. - BP well-controlled at home. - Continue Coreg 9.375mg  BID and Accupril 20mg  BID.   4. HLD - Lipid Panel in 01/2016 showed total cholesterol 114, HDL 42, and LDL 52. - continue Crestor 20mg  daily.     Medication Adjustments/Labs and Tests Ordered: Current medicines are reviewed at length with the patient today.  Concerns regarding medicines are outlined above.  Medication changes, Labs and Tests ordered today are listed in the Patient Instructions below. Patient Instructions  Medication Instructions:  DECREASE- Lasix 80 mg in morning and 40 mg at night  Labwork: None Ordered  Testing/Procedures: None Ordered  Follow-Up: Your physician recommends that you schedule a follow-up appointment in: 2 Months with Dr Debara Pickett  Any Other Special Instructions Will Be Listed Below (If Applicable).  If you need a refill on your cardiac medications before your next appointment, please call your pharmacy.  Signed, Erma Heritage, PA-C  04/18/2017 4:30 PM    [facility], [place], [place]   [address] Phone: [phone]; Fax: [phone]  [address], [place], [place] [address] Phone: [phone]

## 2017-04-18 ENCOUNTER — Encounter: Payer: Self-pay | Admitting: Student

## 2017-04-18 ENCOUNTER — Ambulatory Visit (INDEPENDENT_AMBULATORY_CARE_PROVIDER_SITE_OTHER): Payer: Medicare Other | Admitting: Student

## 2017-04-18 VITALS — BP 166/79 | HR 63 | Ht 64.0 in | Wt 279.0 lb

## 2017-04-18 DIAGNOSIS — I5042 Chronic combined systolic (congestive) and diastolic (congestive) heart failure: Secondary | ICD-10-CM

## 2017-04-18 DIAGNOSIS — I10 Essential (primary) hypertension: Secondary | ICD-10-CM | POA: Diagnosis not present

## 2017-04-18 DIAGNOSIS — I428 Other cardiomyopathies: Secondary | ICD-10-CM

## 2017-04-18 DIAGNOSIS — E782 Mixed hyperlipidemia: Secondary | ICD-10-CM | POA: Diagnosis not present

## 2017-04-18 NOTE — Patient Instructions (Addendum)
Medication Instructions:  DECREASE- Lasix 80 mg in morning and 40 mg at night  Labwork: None Ordered  Testing/Procedures: None Ordered  Follow-Up: Your physician recommends that you schedule a follow-up appointment in: 2 Months with Dr Debara Pickett   Any Other Special Instructions Will Be Listed Below (If Applicable).   If you need a refill on your cardiac medications before your next appointment, please call your pharmacy.

## 2017-04-26 ENCOUNTER — Encounter: Payer: Self-pay | Admitting: Cardiology

## 2017-06-23 ENCOUNTER — Ambulatory Visit: Payer: Medicare Other | Admitting: Internal Medicine

## 2017-06-28 ENCOUNTER — Telehealth: Payer: Self-pay | Admitting: Internal Medicine

## 2017-06-28 NOTE — Telephone Encounter (Signed)
Meloxicam is NSAID. Can increase risk of bleeding and carries some cardiovascular risk. Would recommend smallest dose to control pain for shortest duration.

## 2017-06-28 NOTE — Telephone Encounter (Signed)
New message   Pt is calling asking for a call back about medications. He said his PCP wants to give him a muscle relaxer but they want to be sure it's ok with his heart condition.

## 2017-06-28 NOTE — Telephone Encounter (Signed)
Returned call to patient- reports PCP would like to but him on meloxicam for pain.  Doctor suggested he verify this is okay with his cardiologist.  Advised I would route to pharmacy for review.     Patient verbalized understanding.

## 2017-06-28 NOTE — Telephone Encounter (Signed)
Patient made aware of recommendations. Verbalized understanding.

## 2017-07-05 ENCOUNTER — Ambulatory Visit (INDEPENDENT_AMBULATORY_CARE_PROVIDER_SITE_OTHER): Payer: Medicare Other | Admitting: *Deleted

## 2017-07-05 DIAGNOSIS — I428 Other cardiomyopathies: Secondary | ICD-10-CM

## 2017-07-05 NOTE — Progress Notes (Signed)
Remote ICD transmission.   

## 2017-07-06 ENCOUNTER — Encounter: Payer: Self-pay | Admitting: Cardiology

## 2017-07-06 LAB — CUP PACEART REMOTE DEVICE CHECK
Battery Remaining Percentage: 90 %
Brady Statistic RV Percent Paced: 1 %
HIGH POWER IMPEDANCE MEASURED VALUE: 82 Ohm
HighPow Impedance: 82 Ohm
Implantable Lead Implant Date: 20170703
Implantable Lead Location: 753860
Lead Channel Impedance Value: 560 Ohm
Lead Channel Pacing Threshold Amplitude: 1 V
Lead Channel Pacing Threshold Pulse Width: 0.5 ms
Lead Channel Setting Pacing Pulse Width: 0.5 ms
MDC IDC MSMT BATTERY REMAINING LONGEVITY: 92 mo
MDC IDC MSMT BATTERY VOLTAGE: 3.14 V
MDC IDC MSMT LEADCHNL RV SENSING INTR AMPL: 12 mV
MDC IDC PG IMPLANT DT: 20170703
MDC IDC SESS DTM: 20180717103355
MDC IDC SET LEADCHNL RV PACING AMPLITUDE: 2.5 V
MDC IDC SET LEADCHNL RV SENSING SENSITIVITY: 0.5 mV
Pulse Gen Serial Number: 7372545

## 2017-07-12 ENCOUNTER — Ambulatory Visit (INDEPENDENT_AMBULATORY_CARE_PROVIDER_SITE_OTHER): Payer: Medicare Other | Admitting: Internal Medicine

## 2017-07-12 ENCOUNTER — Encounter: Payer: Self-pay | Admitting: Internal Medicine

## 2017-07-12 VITALS — BP 132/82 | HR 71 | Ht 64.0 in | Wt 282.2 lb

## 2017-07-12 DIAGNOSIS — I5042 Chronic combined systolic (congestive) and diastolic (congestive) heart failure: Secondary | ICD-10-CM | POA: Diagnosis not present

## 2017-07-12 DIAGNOSIS — I428 Other cardiomyopathies: Secondary | ICD-10-CM

## 2017-07-12 DIAGNOSIS — I5023 Acute on chronic systolic (congestive) heart failure: Secondary | ICD-10-CM | POA: Diagnosis not present

## 2017-07-12 NOTE — Progress Notes (Signed)
HPI Jeffery Nelson returns today for ongoing evaluation of syncope, LV dysfunction, chronic systolic heart failure and is s/p ICD implantation. He has been stable in the interim with no ICD shock. No palpitations. He remains unable to lose weight. No edema. He has not had syncope. He does note some problems with the arch of his foot on the right.  Allergies  Allergen Reactions  . Doxycycline     RECTAL BLEEDING  . Lipitor [Atorvastatin] Swelling  . Lyrica [Pregabalin] Swelling     Current Outpatient Prescriptions  Medication Sig Dispense Refill  . aspirin EC 81 MG tablet Take 81 mg by mouth daily.    . carvedilol (COREG) 6.25 MG tablet Take 1.5 tablets (9.375 mg total) by mouth 2 (two) times daily. 90 tablet 6  . colchicine 0.6 MG tablet Take 0.6 mg by mouth as needed (for gout).     . fluocinonide cream (LIDEX) 6.16 % Apply 1 application topically as needed (rash).    [name] FLUoxetine (PROZAC) 20 MG tablet Take 20 mg by mouth daily.    . furosemide (LASIX) 40 MG tablet Take 2 tablets(80 mg) in the Morning and 1 tablets(40 mg) in the Evening    . HUMALOG MIX 75/25 (75-25) 100 UNIT/ML SUSP injection Inject 55-70 Units as directed daily. Inject 70 units at breakfast and dinner, and 55 units at lunch time  2  . potassium chloride SA (KLOR-CON M20) 20 MEQ tablet Take 1 tablet (20 mEq total) by mouth 2 (two) times daily. 180 tablet 3  . quinapril (ACCUPRIL) 20 MG tablet Take 20 mg by mouth 2 (two) times daily.    [name] rOPINIRole (REQUIP) 4 MG tablet Take 4 mg by mouth daily.     . rosuvastatin (CRESTOR) 20 MG tablet Take 20 mg by mouth daily.     No current facility-administered medications for this visit.      Past Medical History:  Diagnosis Date  . AICD (automatic cardioverter/defibrillator) present    a. 06/21/2016 SJM single lead AICD (ser # [id number]).  . Benign neoplasm of colon   . Chest pain    From rib fractures  . Chronic combined systolic and diastolic CHF (congestive heart  failure) ([place])    a. 01/2016 Echo: EF 20-25%, diff HK; b. 05/2016 Echo: EF 35-40%, diff HK, gr1 DD, diff HK, mild MR, mod dil LA/RA, mod TR, PASP 73mmHg.  [name] Gout   . Hypertensive heart disease with heart failure ([place])   . Lower extremity edema   . NICM (nonischemic cardiomyopathy) ([place])     01/2016 Echo: EF 20-25%, b. 01/2016 Cath: LM nl, LAD min irregs, D1 50ost, LCX min irregs, RCA min irregs, RPDA/RPL1 nl;  c. 05/2016 Echo: EF 35-40%;  d. 06/2016 s/p SJM single lead AICD (ser # [id number])..  . Non-obstructive CAD    a. 01/2016 Cath: LM nl, LAD min irregs, D1 50ost, LCX min irregs, RCA min irregs, RPDA/RPL1 nl.  . Polyneuropathy in diabetes(357.2)   . Psychosexual dysfunction with inhibited sexual excitement   . Pure hypercholesterolemia   . Restless legs syndrome (RLS)   . Sleep apnea    "probably" (06/21/2016)  . Sleep arousal disorder   . Syncope    a. Presumed to be 2/2 VT in setting of NICM-->s/p single lead SJM AICD in 06/2016.  . Type II diabetes mellitus ([place])   . Unspecified venous (peripheral) insufficiency     ROS:   All systems reviewed and negative except as noted  in the HPI.   Past Surgical History:  Procedure Laterality Date  . CARDIAC CATHETERIZATION N/A 02/12/2016   Procedure: Right/Left Heart Cath and Coronary Angiography;  Surgeon: Pixie Casino, MD;  Location: [facility];  Service: Cardiovascular;  Laterality: N/A;  . CATARACT EXTRACTION W/ INTRAOCULAR LENS  IMPLANT, BILATERAL Bilateral 2009  . EP IMPLANTABLE DEVICE N/A 06/21/2016   Procedure: ICD Implant;  Surgeon: Evans Lance, MD;  Location: [facility];  Service: Cardiovascular;  Laterality: N/A;  . FIXATION KYPHOPLASTY LUMBAR SPINE  2008  . FRACTURE SURGERY    . INTRAMEDULLARY (IM) NAIL INTERTROCHANTERIC Left 09/21/2014   Procedure: left hip intertroch fracture;  Surgeon: [name], MD;  Location: [facility];  Service: Orthopedics;  Laterality: Left;  . LOWER EXTREMITY VENOUS DOPPLER  09/04/2012   Mild  valvular insufficiency in the R Common Femoral vein w/ 1.6 sec of duration of refliux  . TRANSTHORACIC ECHOCARDIOGRAM  09/04/2012   EF >55%, normal     Family History  Problem Relation Age of Onset  . Heart disease Mother   . Heart disease Father   . Heart disease Maternal Grandmother   . Heart disease Maternal Grandfather   . Stroke Paternal Grandmother   . Cancer Paternal Grandfather        Skin cancer  . Diabetes Brother      Social History   Social History  . Marital status: Widowed    Spouse name: N/A  . Number of children: N/A  . Years of education: N/A   Occupational History  . Not on file.   Social History Main Topics  . Smoking status: Never Smoker  . Smokeless tobacco: Never Used  . Alcohol use No  . Drug use: No  . Sexual activity: Yes   Other Topics Concern  . Not on file   Social History Narrative  . No narrative on file     BP 132/82   Pulse 71   Ht 5\' 4"  (1.626 m)   Wt 282 lb 3.2 oz (128 kg)   SpO2 96%   BMI 48.44 kg/m   Physical Exam:  Massively obese appearing 71 yo man, NAD HEENT: Unremarkable Neck:  Unable to assess JVD, no thyromegally Lymphatics:  No adenopathy Back:  No CVA tenderness Lungs:  Clear with no wheezes, well healed ICD incision. HEART:  Regular rate rhythm, no murmurs, no rubs, no clicks Abd:  soft, positive bowel sounds, no organomegally, no rebound, no guarding Ext:  2 plus pulses, no edema, no cyanosis, no clubbing Skin:  No rashes no nodules Neuro:  CN II through XII intact, motor grossly intact  DEVICE  Normal device function.  See PaceArt for details.   Assess/Plan: 1. Chronic systolic heart failure - despite his obesity and LV dysfunction, he has maintained class 2 symptoms. Will follow. 2. Obesity - he has actually lost a few pounds. We discussed the importance of weight loss. 3. HTN - his blood pressure is up. I have asked him to reduce his salt intake. He admits to dietary indiscretion. 4. ICD - his  [employer] device is working normally. Will follow.  Mikle Bosworth.D.

## 2017-07-12 NOTE — Patient Instructions (Addendum)
Medication Instructions:  Your physician recommends that you continue on your current medications as directed. Please refer to the Current Medication list given to you today.   Labwork: None ordered.   Testing/Procedures: None ordered.   Follow-Up: Your physician wants you to follow-up in: one year with Dr. Lovena Le.  You will receive a reminder letter in the mail two months in advance. If you don't receive a letter, please call our office to schedule the follow-up appointment.  Remote monitoring is used to monitor your ICD from home. This monitoring reduces the number of office visits required to check your device to one time per year. It allows [place] to keep an eye on the functioning of your device to ensure it is working properly. You are scheduled for a device check from home on 10/04/2017. You may send your transmission at any time that day. If you have a wireless device, the transmission will be sent automatically. After your physician reviews your transmission, you will receive a postcard with your next transmission date.    Any Other Special Instructions Will Be Listed Below (If Applicable).     If you need a refill on your cardiac medications before your next appointment, please call your pharmacy.

## 2017-07-18 ENCOUNTER — Encounter: Payer: Self-pay | Admitting: *Deleted

## 2017-07-18 LAB — CUP PACEART INCLINIC DEVICE CHECK
Battery Remaining Longevity: 92 mo
Brady Statistic RV Percent Paced: 0.02 %
Date Time Interrogation Session: 20180724190053
HighPow Impedance: 81 Ohm
Implantable Lead Implant Date: 20170703
Implantable Lead Location: 753860
Lead Channel Pacing Threshold Amplitude: 1 V
Lead Channel Setting Pacing Amplitude: 2.5 V
MDC IDC MSMT LEADCHNL RV IMPEDANCE VALUE: 512.5 Ohm
MDC IDC MSMT LEADCHNL RV PACING THRESHOLD PULSEWIDTH: 0.5 ms
MDC IDC MSMT LEADCHNL RV SENSING INTR AMPL: 12 mV
MDC IDC PG IMPLANT DT: 20170703
MDC IDC SET LEADCHNL RV PACING PULSEWIDTH: 0.5 ms
MDC IDC SET LEADCHNL RV SENSING SENSITIVITY: 0.5 mV
Pulse Gen Serial Number: 7372545

## 2017-07-28 ENCOUNTER — Ambulatory Visit: Payer: Medicare Other | Admitting: Internal Medicine

## 2017-08-23 ENCOUNTER — Encounter: Payer: Self-pay | Admitting: Internal Medicine

## 2017-08-23 ENCOUNTER — Ambulatory Visit (INDEPENDENT_AMBULATORY_CARE_PROVIDER_SITE_OTHER): Payer: Medicare Other | Admitting: Internal Medicine

## 2017-08-23 VITALS — BP 163/84 | HR 78 | Ht 64.0 in | Wt 299.4 lb

## 2017-08-23 DIAGNOSIS — I10 Essential (primary) hypertension: Secondary | ICD-10-CM | POA: Diagnosis not present

## 2017-08-23 DIAGNOSIS — Z9114 Patient's other noncompliance with medication regimen: Secondary | ICD-10-CM | POA: Diagnosis not present

## 2017-08-23 DIAGNOSIS — Z91148 Patient's other noncompliance with medication regimen for other reason: Secondary | ICD-10-CM

## 2017-08-23 DIAGNOSIS — I5023 Acute on chronic systolic (congestive) heart failure: Secondary | ICD-10-CM | POA: Diagnosis not present

## 2017-08-23 HISTORY — DX: Patient's other noncompliance with medication regimen: Z91.14

## 2017-08-23 HISTORY — DX: Patient's other noncompliance with medication regimen for other reason: Z91.148

## 2017-08-23 MED ORDER — QUINAPRIL HCL 20 MG PO TABS: 20.0000 mg | ORAL_TABLET | Freq: Two times a day (BID) | ORAL | 5 refills | Status: DC

## 2017-08-23 MED ORDER — FUROSEMIDE 40 MG PO TABS: 80.0000 mg | ORAL_TABLET | Freq: Two times a day (BID) | ORAL | 5 refills | Status: DC

## 2017-08-23 NOTE — Patient Instructions (Addendum)
Your physician recommends that you schedule a follow-up appointment in: [place] with Dr. Debara Pickett  Your physician has recommended you make the following change in your medication:  -- RESUME lasix 80mg  twice daily -- RESUME quinapril (prescriptions have been sent to pharmacy)

## 2017-08-23 NOTE — Progress Notes (Signed)
[name]    OFFICE NOTE  Chief Complaint:  Follow-up heart failure  Primary Care Physician: [name], MD  HPI:  Jeffery Nelson is a 71 year old gentleman referred to me for lower extremity edema and shortness of breath. As you know, he is morbidly obese, almost super morbidly obese with a weight of 330 pounds, BMI of 54. He underwent an echocardiogram which demonstrated mild concentric LVH, EF greater than 55%. The left atrium was mildly dilated. However, there were no significant valvular abnormalities. Pulmonary pressures were not elevated. He also underwent lower extremity Dopplers looking for venous insufficiency. There was a small amount of reflux in the right common femoral vein, which is a deep vein. However, the superficial veins were negative for reflux or treatable venous insufficiency. I reviewed the results with the patient today and I feel that his best option is to continue with lower extremity compression stockings and work on weight loss. He is also at high risk for sleep apnea and I have referred him for a sleep study - unfortunately he never made this appointment.  Currently has no other symptoms other than he related a recent bout of shingles that he underwent. This has since resolved and he was not left with any chronic pain.  I saw Jeffery Nelson back in the office today. Overall he is doing fairly well. He had left hip replacement placement recently due to what sound like a spontaneous fracture. Apparently he has of some degree of osteopenia. He was supposed been medicine for that but is not taking it. Fortunately has had about 30-35 pound weight loss and although he never got his foot split-night sleep study, he denies any new apnea symptoms and feels like he is rested during the day. He has been getting a little more shortness of breath mostly when laying down but that is gotten better and he has no new complaints today.  Jeffery Nelson returns today in the office. He reports that  he's recently had some weight gain and lower extremity swelling. He says it is also has some shortness of breath with exertion. Swelling seems to be a little worse in the right leg than the left. He reports she's been compliant with his current dose of Lasix. He is also taking over-the-counter potassium supplements. He denies any chest pain.  I saw Jeffery Nelson back today in follow-up. He reports he still persistently short of breath. I asked him to increase his Lasix and prescribe some potassium however he said he didn't have the money to get potassium and therefore was nervous about taking extra Lasix so he never increased the dose the medicine. In the interim though he's gained about 6 pounds. He does have lower strandy swelling and persistent shortness of breath. His echocardiogram unfortunate shows a new cardiomyopathy with an EF of 20-25% with global hypokinesis which is severe. Previous echo at least a year ago showed an EF which was normal. He denies any chest pain episodes or recent significant viral illnesses that might explain his reduced LV function. I'm surly concerned about coronary artery disease. He also denies any alcohol or drug use.  Jeffery Nelson returns today for follow-up of his heart catheterization, the results are as follows:   Ost 1st Diag to 1st Diag lesion, 50% stenosed.  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 Moderate pulmonary venous hypertension, reduced cardiac index. Elevated PCWP. Will need additional diuresis and  medication adjustment for non-ischemic cardiomyopathy as an outpatient. I recommended increasing his Lasix to 40 mg twice a day. Since that change she's had significant weight loss. Weight today is 258, which is down from 281. He said no, locations from his radial catheterization site. As mentioned, the plan is to titrate his medical  therapy. He reports his shortness of breath is completely resolved and his leg swelling is improved significantly.  06/03/2016  Jeffery Nelson returns today for follow-up. He has gained about 15 pounds. There is some extra edema but mostly this is weight gain not related to heart failure. However he is not as compliant with twice-daily Lasix. He denies any worsening shortness of breath. He had another episode of syncope which was witnessed. He became apparently pale and unresponsive. By the time EMS arrived he was doing well and declined transport to the hospital. He's had 3 episodes in about 6 months. This was the first episode that was witnessed. I'm concerned about a possible arrhythmia.  09/07/2016  Jeffery Nelson seen today in follow-up. He reports little improvement with increasing his Lasix. Weight is now up to 301 pounds, from 276 lbs approximately 3 months ago. He reports significant lower extremity swelling and weeping edema. 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 He has had no complications from that AICD.  04/01/2017  Jeffery Nelson returns today for follow-up. Again there is been some lack of compliance with medications. He's recently started having some weeping edema again of the right lower extremity. This is been wrapped by his primary care provider. He reports it is only taking Lasix 40 mg twice a day not 80 mg twice a day as previously prescribed. He was also on metolazone but he said he could not afford that as it was not covered. Weight is now back to 301 pounds which it was in September however he did come down significantly to 291 pounds in October.  08/23/2017  Jeffery Nelson seen today in follow-up. Unfortunately he's been off of his medications for about 2 weeks due to cost issues. He says is in the donut hole and had to have his insulin readjusted. This morning he had a hypoglycemic episode. He was aware of and had a  diaphoresis and shakiness. He took some sugar and improved. He says he's not taken his diuretic and his weight is creeping back up to where was a few months ago. When he was previously taken his 80 mg of Lasix twice daily his BMP and lowered to 108 and his weeping edema had improved. Today he has weeping edema again with asymmetric edema the right lower extremity chronic venous stasis changes. Eyes chest pain or worsening shortness of breath. Blood pressure is elevated but again he has not taken his quinapril. He had AICD follow-up with Dr. Lovena Le in July and he felt that everything was normal with the device.  PMHx:  Past Medical History:  Diagnosis Date  . AICD (automatic cardioverter/defibrillator) present    a. 06/21/2016 SJM single lead AICD (ser # [id number]).  . Benign neoplasm of colon   . Chest pain    From rib fractures  . Chronic combined systolic and diastolic CHF (congestive heart failure) ([place])    a. 01/2016 Echo: EF 20-25%, diff HK; b. 05/2016 Echo: EF 35-40%, diff HK, gr1 DD, diff HK, mild MR, mod dil LA/RA, mod TR, PASP 60mmHg.  [name] Gout   . Hypertensive  heart disease with heart failure ([place])   . Lower extremity edema   . NICM (nonischemic cardiomyopathy) ([place])     01/2016 Echo: EF 20-25%, b. 01/2016 Cath: LM nl, LAD min irregs, D1 50ost, LCX min irregs, RCA min irregs, RPDA/RPL1 nl;  c. 05/2016 Echo: EF 35-40%;  d. 06/2016 s/p SJM single lead AICD (ser # [id number])..  . Non-obstructive CAD    a. 01/2016 Cath: LM nl, LAD min irregs, D1 50ost, LCX min irregs, RCA min irregs, RPDA/RPL1 nl.  . Polyneuropathy in diabetes(357.2)   . Psychosexual dysfunction with inhibited sexual excitement   . Pure hypercholesterolemia   . Restless legs syndrome (RLS)   . Sleep apnea    "probably" (06/21/2016)  . Sleep arousal disorder   . Syncope    a. Presumed to be 2/2 VT in setting of NICM-->s/p single lead SJM AICD in 06/2016.  . Type II diabetes mellitus ([place])   . Unspecified venous (peripheral)  insufficiency     Past Surgical History:  Procedure Laterality Date  . CARDIAC CATHETERIZATION N/A 02/12/2016   Procedure: Right/Left Heart Cath and Coronary Angiography;  Surgeon: Pixie Casino, MD;  Location: [facility];  Service: Cardiovascular;  Laterality: N/A;  . CATARACT EXTRACTION W/ INTRAOCULAR LENS  IMPLANT, BILATERAL Bilateral 2009  . EP IMPLANTABLE DEVICE N/A 06/21/2016   Procedure: ICD Implant;  Surgeon: Evans Lance, MD;  Location: [place] [facility];  Service: Cardiovascular;  Laterality: N/A;  . FIXATION KYPHOPLASTY LUMBAR SPINE  2008  . FRACTURE SURGERY    . INTRAMEDULLARY (IM) NAIL INTERTROCHANTERIC Left 09/21/2014   Procedure: left hip intertroch fracture;  Surgeon: [name], MD;  Location: [facility];  Service: Orthopedics;  Laterality: Left;  . LOWER EXTREMITY VENOUS DOPPLER  09/04/2012   Mild valvular insufficiency in the R Common Femoral vein w/ 1.6 sec of duration of refliux  . TRANSTHORACIC ECHOCARDIOGRAM  09/04/2012   EF >55%, normal    FAMHx:  Family History  Problem Relation Age of Onset  . Heart disease Mother   . Heart disease Father   . Heart disease Maternal Grandmother   . Heart disease Maternal Grandfather   . Stroke Paternal Grandmother   . Cancer Paternal Grandfather        Skin cancer  . Diabetes Brother     SOCHx:   reports that he has never smoked. He has never used smokeless tobacco. He reports that he does not drink alcohol or use drugs.  ALLERGIES:  Allergies  Allergen Reactions  . Doxycycline     RECTAL BLEEDING  . Lipitor [Atorvastatin] Swelling  . Lyrica [Pregabalin] Swelling    ROS: Pertinent items noted in HPI and remainder of comprehensive ROS otherwise negative.  HOME MEDS: Current Outpatient Prescriptions  Medication Sig Dispense Refill  . aspirin EC 81 MG tablet Take 81 mg by mouth daily.    . carvedilol (COREG) 6.25 MG tablet Take 1.5 tablets (9.375 mg total) by mouth 2 (two) times daily. 90 tablet 6  .  colchicine 0.6 MG tablet Take 0.6 mg by mouth as needed (for gout).     . fluocinonide cream (LIDEX) 6.07 % Apply 1 application topically as needed (rash).    [name] FLUoxetine (PROZAC) 20 MG tablet Take 20 mg by mouth daily.    . furosemide (LASIX) 40 MG tablet Take 2 tablets (80 mg total) by mouth 2 (two) times daily. 120 tablet 5  . HUMALOG MIX 75/25 (75-25) 100 UNIT/ML SUSP injection Inject 55-70  Units as directed daily. Inject 70 units at breakfast and dinner, and 55 units at lunch time  2  . potassium chloride SA (KLOR-CON M20) 20 MEQ tablet Take 1 tablet (20 mEq total) by mouth 2 (two) times daily. 180 tablet 3  . quinapril (ACCUPRIL) 20 MG tablet Take 1 tablet (20 mg total) by mouth 2 (two) times daily. 30 tablet 5  . rOPINIRole (REQUIP) 4 MG tablet Take 4 mg by mouth daily.     . rosuvastatin (CRESTOR) 20 MG tablet Take 20 mg by mouth daily.     No current facility-administered medications for this visit.     LABS/IMAGING: No results found for this or any previous visit (from the past 48 hour(s)). No results found.  VITALS: BP (!) 163/84   Pulse 78   Ht 5\' 4"  (1.626 m)   Wt 299 lb 6.4 oz (135.8 kg)   BMI 51.39 kg/m  General appearance: alert and no distress Neck: no carotid bruit, no JVD and thyroid not enlarged, symmetric, no tenderness/mass/nodules Lungs: clear to auscultation bilaterally Heart: regular rate and rhythm Abdomen: morbidly obese Extremities: edema 3+ right lower extremity and 2+ left lower extremity stasis edema with erythema, blisters and venous congestion Pulses: Diminished pulses Skin: Peripheral erythema Woody stasis edema Neurologic: Grossly normal Psych: Mildly depressed   EKG: Normal sinus rhythm at 78, LVH with repolarization abnormality-personally reviewed  ASSESSMENT: 1. New systolic congestive heart failure with EF 20-25% - nonischemic cardiomyopathy with mild CAD 2. Dyspnea, weight gain, leg swelling 3. Morbid obesity 4. Bilateral deep vein  reflux with edema, right greater than left 5. Probable obstructive sleep apnea - awaiting sleep study 6. Recent total L hip arthroplasty 7. 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   Jeffery Nelson unfortunately is gaining fluid back in this is likely related to noncompliance with his medications. He says he's been off of his Lasix for 2 weeks. He is not taking his quinapril. Blood pressure is elevated. He should restart Lasix 80 mg twice daily and his quinapril and I provided prescriptions for that. The main issue is that he is currently in the donut hole. He is also having problems with his right foot which is now externally rotated. He's working with [facility] for brace to help him walk a little better. Follow-up with me in 3 months for ongoing management of his heart failure.  Pixie Casino, MD, [facility] Attending Cardiologist [place] 08/23/2017, 9:20 AM

## 2017-08-25 ENCOUNTER — Other Ambulatory Visit: Payer: Self-pay

## 2017-08-25 MED ORDER — QUINAPRIL HCL 20 MG PO TABS: 20.0000 mg | ORAL_TABLET | Freq: Two times a day (BID) | ORAL | 3 refills | Status: DC

## 2017-10-04 ENCOUNTER — Ambulatory Visit (INDEPENDENT_AMBULATORY_CARE_PROVIDER_SITE_OTHER): Payer: Medicare Other | Admitting: *Deleted

## 2017-10-04 DIAGNOSIS — I428 Other cardiomyopathies: Secondary | ICD-10-CM

## 2017-10-04 NOTE — Progress Notes (Signed)
Remote ICD transmission.   

## 2017-10-07 ENCOUNTER — Encounter: Payer: Self-pay | Admitting: Cardiology

## 2017-10-24 LAB — CUP PACEART REMOTE DEVICE CHECK
Battery Voltage: 3.07 V
Brady Statistic RV Percent Paced: 1 %
HighPow Impedance: 81 Ohm
HighPow Impedance: 81 Ohm
Implantable Lead Implant Date: 20170703
Implantable Lead Model: 7122
Lead Channel Impedance Value: 550 Ohm
Lead Channel Pacing Threshold Amplitude: 1 V
Lead Channel Sensing Intrinsic Amplitude: 12 mV
MDC IDC LEAD LOCATION: 753860
MDC IDC MSMT BATTERY REMAINING LONGEVITY: 90 mo
MDC IDC MSMT BATTERY REMAINING PERCENTAGE: 88 %
MDC IDC MSMT LEADCHNL RV PACING THRESHOLD PULSEWIDTH: 0.5 ms
MDC IDC PG IMPLANT DT: 20170703
MDC IDC SESS DTM: 20181016090508
MDC IDC SET LEADCHNL RV PACING AMPLITUDE: 2.5 V
MDC IDC SET LEADCHNL RV PACING PULSEWIDTH: 0.5 ms
MDC IDC SET LEADCHNL RV SENSING SENSITIVITY: 0.5 mV
Pulse Gen Serial Number: 7372545

## 2017-11-28 ENCOUNTER — Ambulatory Visit: Payer: Medicare Other | Admitting: Internal Medicine

## 2018-01-02 ENCOUNTER — Ambulatory Visit: Payer: Medicare Other | Admitting: Internal Medicine

## 2018-01-03 ENCOUNTER — Ambulatory Visit (INDEPENDENT_AMBULATORY_CARE_PROVIDER_SITE_OTHER): Payer: Medicare Other | Admitting: *Deleted

## 2018-01-03 DIAGNOSIS — I428 Other cardiomyopathies: Secondary | ICD-10-CM | POA: Diagnosis not present

## 2018-01-04 NOTE — Progress Notes (Signed)
Remote ICD transmission.   

## 2018-01-05 LAB — CUP PACEART REMOTE DEVICE CHECK
Battery Remaining Percentage: 86 %
Battery Voltage: 3.04 V
Date Time Interrogation Session: 20190115070016
HIGH POWER IMPEDANCE MEASURED VALUE: 83 Ohm
HIGH POWER IMPEDANCE MEASURED VALUE: 83 Ohm
Implantable Lead Location: 753860
Implantable Lead Model: 7122
Implantable Pulse Generator Implant Date: 20170703
Lead Channel Impedance Value: 530 Ohm
Lead Channel Pacing Threshold Pulse Width: 0.5 ms
Lead Channel Sensing Intrinsic Amplitude: 12 mV
Lead Channel Setting Pacing Amplitude: 2.5 V
Lead Channel Setting Pacing Pulse Width: 0.5 ms
MDC IDC LEAD IMPLANT DT: 20170703
MDC IDC MSMT BATTERY REMAINING LONGEVITY: 88 mo
MDC IDC MSMT LEADCHNL RV PACING THRESHOLD AMPLITUDE: 1 V
MDC IDC PG SERIAL: 7372545
MDC IDC SET LEADCHNL RV SENSING SENSITIVITY: 0.5 mV
MDC IDC STAT BRADY RV PERCENT PACED: 1 %

## 2018-01-06 ENCOUNTER — Encounter: Payer: Self-pay | Admitting: Cardiology

## 2018-02-14 ENCOUNTER — Telehealth: Payer: Self-pay | Admitting: Cardiovascular Disease

## 2018-02-14 NOTE — Telephone Encounter (Signed)
Brief Cardiology Note  Received telephone call from Mr. Jeffery Nelson.  He fell this evening while walking in his kitchen.  Did not lose consciousness or sustain significant trauma.  He fell onto his chest.  He called to ask whether his defibrillator should be evaluated after the fall.  Per the patient there is no visible trauma to the device.  Based on the information he provided, I think it is very unlikely that the device or lead has been damaged.  Therefore, I recommended he not take any further action regarding this. His device is for primary prevention, and he does not have a history of VAs or ICD shocks.  He will see Dr. Debara Pickett on 02/28/2018.  Garrison Columbus, MD Cardiology

## 2018-02-15 ENCOUNTER — Telehealth: Payer: Self-pay | Admitting: Internal Medicine

## 2018-02-15 NOTE — Telephone Encounter (Signed)
Patient states that he fell last night and he not sure what caused the fall.  Patient is a little sore in the chest and he would like to discuss the next steps

## 2018-02-15 NOTE — Telephone Encounter (Signed)
Spoke with pt, had him send in a transmission, there were no episodes that correlated with time of fall last night. Pt stated that he was a little bit SOB, informed pt that the transmission indicated that he was building up fluid. Asked pt if he was taking his Lasix 40mg  twice a day as prescribed, pt stated that he hadn't been taking it like it he should informed pt that he need to start taking the Lasix as prescribed and it would help with his SOB, pt voiced understanding. Encouraged pt to f/u with PCP about fall pt voiced understanding.

## 2018-02-17 ENCOUNTER — Other Ambulatory Visit: Payer: Self-pay

## 2018-02-17 ENCOUNTER — Emergency Department (HOSPITAL_COMMUNITY): Payer: Medicare Other

## 2018-02-17 ENCOUNTER — Encounter (HOSPITAL_COMMUNITY): Payer: Self-pay | Admitting: *Deleted

## 2018-02-17 ENCOUNTER — Inpatient Hospital Stay (HOSPITAL_COMMUNITY)
Admission: EM | Admit: 2018-02-17 | Discharge: 2018-02-22 | DRG: 292 | Disposition: A | Discharge status: Home Health | Payer: Medicare Other | Attending: Internal Medicine | Admitting: Internal Medicine

## 2018-02-17 DIAGNOSIS — R739 Hyperglycemia, unspecified: Secondary | ICD-10-CM | POA: Diagnosis not present

## 2018-02-17 DIAGNOSIS — I878 Other specified disorders of veins: Secondary | ICD-10-CM | POA: Diagnosis not present

## 2018-02-17 DIAGNOSIS — Z823 Family history of stroke: Secondary | ICD-10-CM | POA: Diagnosis not present

## 2018-02-17 DIAGNOSIS — E119 Type 2 diabetes mellitus without complications: Secondary | ICD-10-CM

## 2018-02-17 DIAGNOSIS — G4730 Sleep apnea, unspecified: Secondary | ICD-10-CM | POA: Diagnosis present

## 2018-02-17 DIAGNOSIS — Z833 Family history of diabetes mellitus: Secondary | ICD-10-CM | POA: Diagnosis not present

## 2018-02-17 DIAGNOSIS — Z8249 Family history of ischemic heart disease and other diseases of the circulatory system: Secondary | ICD-10-CM

## 2018-02-17 DIAGNOSIS — I110 Hypertensive heart disease with heart failure: Secondary | ICD-10-CM | POA: Diagnosis not present

## 2018-02-17 DIAGNOSIS — M109 Gout, unspecified: Secondary | ICD-10-CM | POA: Diagnosis present

## 2018-02-17 DIAGNOSIS — T502X5A Adverse effect of carbonic-anhydrase inhibitors, benzothiadiazides and other diuretics, initial encounter: Secondary | ICD-10-CM | POA: Diagnosis not present

## 2018-02-17 DIAGNOSIS — I10 Essential (primary) hypertension: Secondary | ICD-10-CM | POA: Diagnosis not present

## 2018-02-17 DIAGNOSIS — I872 Venous insufficiency (chronic) (peripheral): Secondary | ICD-10-CM

## 2018-02-17 DIAGNOSIS — Z794 Long term (current) use of insulin: Secondary | ICD-10-CM

## 2018-02-17 DIAGNOSIS — I509 Heart failure, unspecified: Secondary | ICD-10-CM

## 2018-02-17 DIAGNOSIS — I5043 Acute on chronic combined systolic (congestive) and diastolic (congestive) heart failure: Secondary | ICD-10-CM

## 2018-02-17 DIAGNOSIS — I5021 Acute systolic (congestive) heart failure: Secondary | ICD-10-CM

## 2018-02-17 DIAGNOSIS — Z6841 Body Mass Index (BMI) 40.0 and over, adult: Secondary | ICD-10-CM | POA: Diagnosis not present

## 2018-02-17 DIAGNOSIS — Z9842 Cataract extraction status, left eye: Secondary | ICD-10-CM | POA: Diagnosis not present

## 2018-02-17 DIAGNOSIS — Z808 Family history of malignant neoplasm of other organs or systems: Secondary | ICD-10-CM | POA: Diagnosis not present

## 2018-02-17 DIAGNOSIS — R0602 Shortness of breath: Secondary | ICD-10-CM | POA: Diagnosis present

## 2018-02-17 DIAGNOSIS — I2510 Atherosclerotic heart disease of native coronary artery without angina pectoris: Secondary | ICD-10-CM | POA: Diagnosis present

## 2018-02-17 DIAGNOSIS — Z961 Presence of intraocular lens: Secondary | ICD-10-CM | POA: Diagnosis present

## 2018-02-17 DIAGNOSIS — I428 Other cardiomyopathies: Secondary | ICD-10-CM

## 2018-02-17 DIAGNOSIS — Z95810 Presence of automatic (implantable) cardiac defibrillator: Secondary | ICD-10-CM | POA: Diagnosis not present

## 2018-02-17 DIAGNOSIS — L03115 Cellulitis of right lower limb: Secondary | ICD-10-CM

## 2018-02-17 DIAGNOSIS — Z9114 Patient's other noncompliance with medication regimen: Secondary | ICD-10-CM

## 2018-02-17 DIAGNOSIS — G2581 Restless legs syndrome: Secondary | ICD-10-CM | POA: Diagnosis present

## 2018-02-17 DIAGNOSIS — Z9841 Cataract extraction status, right eye: Secondary | ICD-10-CM | POA: Diagnosis not present

## 2018-02-17 DIAGNOSIS — E871 Hypo-osmolality and hyponatremia: Secondary | ICD-10-CM | POA: Diagnosis not present

## 2018-02-17 DIAGNOSIS — E1142 Type 2 diabetes mellitus with diabetic polyneuropathy: Secondary | ICD-10-CM | POA: Diagnosis present

## 2018-02-17 DIAGNOSIS — E785 Hyperlipidemia, unspecified: Secondary | ICD-10-CM | POA: Diagnosis present

## 2018-02-17 DIAGNOSIS — Z7982 Long term (current) use of aspirin: Secondary | ICD-10-CM | POA: Diagnosis not present

## 2018-02-17 DIAGNOSIS — Z881 Allergy status to other antibiotic agents status: Secondary | ICD-10-CM | POA: Diagnosis not present

## 2018-02-17 DIAGNOSIS — E7800 Pure hypercholesterolemia, unspecified: Secondary | ICD-10-CM | POA: Diagnosis present

## 2018-02-17 DIAGNOSIS — R0902 Hypoxemia: Secondary | ICD-10-CM | POA: Diagnosis present

## 2018-02-17 DIAGNOSIS — I8310 Varicose veins of unspecified lower extremity with inflammation: Secondary | ICD-10-CM

## 2018-02-17 DIAGNOSIS — E1165 Type 2 diabetes mellitus with hyperglycemia: Secondary | ICD-10-CM | POA: Diagnosis present

## 2018-02-17 DIAGNOSIS — Z888 Allergy status to other drugs, medicaments and biological substances status: Secondary | ICD-10-CM

## 2018-02-17 DIAGNOSIS — Z9119 Patient's noncompliance with other medical treatment and regimen: Secondary | ICD-10-CM

## 2018-02-17 HISTORY — DX: Venous insufficiency (chronic) (peripheral): I87.2

## 2018-02-17 HISTORY — DX: Heart failure, unspecified: I50.9

## 2018-02-17 HISTORY — DX: Cellulitis of right lower limb: L03.115

## 2018-02-17 LAB — CBC WITH DIFFERENTIAL/PLATELET
BASOS PCT: 0 %
Basophils Absolute: 0 10*3/uL (ref 0.0–0.1)
EOS PCT: 3 %
Eosinophils Absolute: 0.2 10*3/uL (ref 0.0–0.7)
HEMATOCRIT: 34.3 % — AB (ref 39.0–52.0)
Hemoglobin: 10.8 g/dL — ABNORMAL LOW (ref 13.0–17.0)
Lymphocytes Relative: 12 %
Lymphs Abs: 1 10*3/uL (ref 0.7–4.0)
MCH: 26.1 pg (ref 26.0–34.0)
MCHC: 31.5 g/dL (ref 30.0–36.0)
MCV: 82.9 fL (ref 78.0–100.0)
MONO ABS: 1.1 10*3/uL — AB (ref 0.1–1.0)
MONOS PCT: 14 %
NEUTROS ABS: 5.8 10*3/uL (ref 1.7–7.7)
Neutrophils Relative %: 71 %
PLATELETS: 169 10*3/uL (ref 150–400)
RBC: 4.14 MIL/uL — ABNORMAL LOW (ref 4.22–5.81)
RDW: 15 % (ref 11.5–15.5)
WBC: 8.1 10*3/uL (ref 4.0–10.5)

## 2018-02-17 LAB — BASIC METABOLIC PANEL
Anion gap: 12 (ref 5–15)
BUN: 25 mg/dL — AB (ref 6–20)
CO2: 25 mmol/L (ref 22–32)
CREATININE: 0.74 mg/dL (ref 0.61–1.24)
Calcium: 8.2 mg/dL — ABNORMAL LOW (ref 8.9–10.3)
Chloride: 99 mmol/L — ABNORMAL LOW (ref 101–111)
GFR calc Af Amer: >60 mL/min (ref >60)
GLUCOSE: 219 mg/dL — AB (ref 65–99)
Potassium: 4 mmol/L (ref 3.5–5.1)
Sodium: 136 mmol/L (ref 135–145)

## 2018-02-17 LAB — CBG MONITORING, ED: Glucose-Capillary: 201 mg/dL — ABNORMAL HIGH (ref 65–99)

## 2018-02-17 LAB — HEMOGLOBIN A1C
Hgb A1c MFr Bld: 8.2 % — ABNORMAL HIGH (ref 4.8–5.6)
Mean Plasma Glucose: 188.64 mg/dL

## 2018-02-17 LAB — TSH: TSH: 3.236 u[IU]/mL (ref 0.350–4.500)

## 2018-02-17 LAB — MRSA PCR SCREENING: MRSA by PCR: NEGATIVE

## 2018-02-17 LAB — BRAIN NATRIURETIC PEPTIDE: B Natriuretic Peptide: 439 pg/mL — ABNORMAL HIGH (ref 0.0–100.0)

## 2018-02-17 LAB — TROPONIN I: Troponin I: <0.03 ng/mL (ref <0.03)

## 2018-02-17 LAB — GLUCOSE, CAPILLARY
GLUCOSE-CAPILLARY: 79 mg/dL (ref 65–99)
GLUCOSE-CAPILLARY: 72 mg/dL (ref 65–99)
GLUCOSE-CAPILLARY: 127 mg/dL — AB (ref 65–99)

## 2018-02-17 MED ORDER — ROPINIROLE HCL 1 MG PO TABS
4.0000 mg | ORAL_TABLET | Freq: Once | ORAL | Status: AC
  Administered 2018-02-17: 4 mg via ORAL
  Filled 2018-02-17 (×2): qty 4

## 2018-02-17 MED ORDER — ACETAMINOPHEN 650 MG RE SUPP: 650.0000 mg | Freq: Four times a day (QID) | RECTAL | Status: DC | PRN

## 2018-02-17 MED ORDER — IPRATROPIUM-ALBUTEROL 0.5-2.5 (3) MG/3ML IN SOLN
3.0000 mL | Freq: Four times a day (QID) | RESPIRATORY_TRACT | Status: DC | PRN
  Administered 2018-02-18 – 2018-02-19 (×2): 3 mL via RESPIRATORY_TRACT
  Filled 2018-02-17 (×2): qty 3

## 2018-02-17 MED ORDER — FUROSEMIDE 10 MG/ML IJ SOLN: 40.0000 mg | Freq: Once | INTRAMUSCULAR | Status: DC

## 2018-02-17 MED ORDER — VANCOMYCIN HCL IN DEXTROSE 1-5 GM/200ML-% IV SOLN: 1000.0000 mg | Freq: Two times a day (BID) | INTRAVENOUS | Status: DC

## 2018-02-17 MED ORDER — ACETAMINOPHEN 325 MG PO TABS: 650.0000 mg | ORAL_TABLET | Freq: Four times a day (QID) | ORAL | Status: DC | PRN

## 2018-02-17 MED ORDER — FUROSEMIDE 10 MG/ML IJ SOLN
80.0000 mg | Freq: Two times a day (BID) | INTRAMUSCULAR | Status: DC
  Administered 2018-02-17: 80 mg via INTRAVENOUS

## 2018-02-17 MED ORDER — FUROSEMIDE 10 MG/ML IJ SOLN
40.0000 mg | Freq: Two times a day (BID) | INTRAMUSCULAR | Status: DC
  Administered 2018-02-17 – 2018-02-18 (×3): 40 mg via INTRAVENOUS
  Filled 2018-02-17 (×3): qty 4

## 2018-02-17 MED ORDER — LORAZEPAM 2 MG/ML IJ SOLN
1.0000 mg | Freq: Once | INTRAMUSCULAR | Status: AC
  Administered 2018-02-17: 1 mg via INTRAVENOUS
  Filled 2018-02-17: qty 1

## 2018-02-17 MED ORDER — VANCOMYCIN HCL 10 G IV SOLR
2000.0000 mg | Freq: Once | INTRAVENOUS | Status: AC
  Administered 2018-02-17: 2000 mg via INTRAVENOUS
  Filled 2018-02-17: qty 2000

## 2018-02-17 MED ORDER — INSULIN ASPART 100 UNIT/ML ~~LOC~~ SOLN
0.0000 [IU] | Freq: Three times a day (TID) | SUBCUTANEOUS | Status: DC
  Administered 2018-02-17: 7 [IU] via SUBCUTANEOUS
  Administered 2018-02-19 – 2018-02-20 (×4): 4 [IU] via SUBCUTANEOUS
  Administered 2018-02-20: 3 [IU] via SUBCUTANEOUS
  Filled 2018-02-17: qty 1

## 2018-02-17 MED ORDER — HYDROCERIN EX CREA
TOPICAL_CREAM | Freq: Every day | CUTANEOUS | Status: DC
  Administered 2018-02-17 – 2018-02-22 (×6): via TOPICAL
  Filled 2018-02-17: qty 113

## 2018-02-17 MED ORDER — BENAZEPRIL HCL 10 MG PO TABS
20.0000 mg | ORAL_TABLET | Freq: Two times a day (BID) | ORAL | Status: DC
  Administered 2018-02-17: 20 mg via ORAL
  Filled 2018-02-17: qty 2
  Filled 2018-02-17: qty 1
  Filled 2018-02-17 (×5): qty 2

## 2018-02-17 MED ORDER — FLUOCINONIDE 0.05 % EX CREA
1.0000 "application " | TOPICAL_CREAM | CUTANEOUS | Status: DC | PRN
  Filled 2018-02-17: qty 30

## 2018-02-17 MED ORDER — ALBUTEROL SULFATE (2.5 MG/3ML) 0.083% IN NEBU
5.0000 mg | INHALATION_SOLUTION | Freq: Once | RESPIRATORY_TRACT | Status: AC
  Administered 2018-02-17: 5 mg via RESPIRATORY_TRACT
  Filled 2018-02-17: qty 6

## 2018-02-17 MED ORDER — CARVEDILOL 3.125 MG PO TABS
3.1250 mg | ORAL_TABLET | Freq: Two times a day (BID) | ORAL | Status: DC
  Administered 2018-02-17 – 2018-02-22 (×10): 3.125 mg via ORAL
  Filled 2018-02-17 (×11): qty 1

## 2018-02-17 MED ORDER — POTASSIUM CHLORIDE CRYS ER 20 MEQ PO TBCR: 20.0000 meq | EXTENDED_RELEASE_TABLET | Freq: Two times a day (BID) | ORAL | Status: DC

## 2018-02-17 MED ORDER — SODIUM CHLORIDE 0.9% FLUSH: 3.0000 mL | INTRAVENOUS | Status: DC | PRN

## 2018-02-17 MED ORDER — INSULIN ASPART PROT & ASPART (70-30 MIX) 100 UNIT/ML ~~LOC~~ SUSP: 55.0000 [IU] | Freq: Three times a day (TID) | SUBCUTANEOUS | Status: DC

## 2018-02-17 MED ORDER — FUROSEMIDE 10 MG/ML IJ SOLN: 80.0000 mg | Freq: Two times a day (BID) | INTRAMUSCULAR | Status: DC

## 2018-02-17 MED ORDER — ONDANSETRON HCL 4 MG/2ML IJ SOLN: 4.0000 mg | Freq: Four times a day (QID) | INTRAMUSCULAR | Status: DC | PRN

## 2018-02-17 MED ORDER — ONDANSETRON HCL 4 MG PO TABS: 4.0000 mg | ORAL_TABLET | Freq: Four times a day (QID) | ORAL | Status: DC | PRN

## 2018-02-17 MED ORDER — ROPINIROLE HCL 1 MG PO TABS
4.0000 mg | ORAL_TABLET | Freq: Every day | ORAL | Status: DC
  Administered 2018-02-17 – 2018-02-22 (×6): 4 mg via ORAL
  Filled 2018-02-17 (×8): qty 4

## 2018-02-17 MED ORDER — FUROSEMIDE 10 MG/ML IJ SOLN
80.0000 mg | Freq: Once | INTRAMUSCULAR | Status: AC
  Administered 2018-02-17: 80 mg via INTRAVENOUS
  Filled 2018-02-17: qty 8

## 2018-02-17 MED ORDER — SODIUM CHLORIDE 0.9 % IV SOLN: 250.0000 mL | INTRAVENOUS | Status: DC | PRN

## 2018-02-17 MED ORDER — ENOXAPARIN SODIUM 40 MG/0.4ML ~~LOC~~ SOLN
40.0000 mg | SUBCUTANEOUS | Status: DC
  Administered 2018-02-17 – 2018-02-22 (×6): 40 mg via SUBCUTANEOUS
  Filled 2018-02-17 (×6): qty 0.4

## 2018-02-17 MED ORDER — SODIUM CHLORIDE 0.9% FLUSH
3.0000 mL | Freq: Two times a day (BID) | INTRAVENOUS | Status: DC
  Administered 2018-02-17 – 2018-02-22 (×10): 3 mL via INTRAVENOUS

## 2018-02-17 MED ORDER — FLUOXETINE HCL 20 MG PO CAPS
20.0000 mg | ORAL_CAPSULE | Freq: Every day | ORAL | Status: DC
  Administered 2018-02-17 – 2018-02-22 (×6): 20 mg via ORAL
  Filled 2018-02-17 (×6): qty 1

## 2018-02-17 MED ORDER — COLCHICINE 0.6 MG PO TABS: 0.6000 mg | ORAL_TABLET | ORAL | Status: DC | PRN

## 2018-02-17 MED ORDER — ROSUVASTATIN CALCIUM 20 MG PO TABS
20.0000 mg | ORAL_TABLET | Freq: Every day | ORAL | Status: DC
  Administered 2018-02-17 – 2018-02-22 (×6): 20 mg via ORAL
  Filled 2018-02-17 (×9): qty 1

## 2018-02-17 MED ORDER — NITROGLYCERIN 2 % TD OINT
1.0000 [in_us] | TOPICAL_OINTMENT | Freq: Once | TRANSDERMAL | Status: AC
  Administered 2018-02-17: 1 [in_us] via TOPICAL
  Filled 2018-02-17: qty 1

## 2018-02-17 MED ORDER — INSULIN ASPART PROT & ASPART (70-30 MIX) 100 UNIT/ML ~~LOC~~ SUSP
35.0000 [IU] | Freq: Two times a day (BID) | SUBCUTANEOUS | Status: DC
  Administered 2018-02-20 – 2018-02-21 (×4): 35 [IU] via SUBCUTANEOUS
  Filled 2018-02-17: qty 10

## 2018-02-17 MED ORDER — POTASSIUM CHLORIDE CRYS ER 20 MEQ PO TBCR
30.0000 meq | EXTENDED_RELEASE_TABLET | Freq: Two times a day (BID) | ORAL | Status: DC
  Administered 2018-02-17 – 2018-02-22 (×11): 30 meq via ORAL
  Filled 2018-02-17: qty 2
  Filled 2018-02-17 (×10): qty 1

## 2018-02-17 MED ORDER — CARVEDILOL 3.125 MG PO TABS
9.3750 mg | ORAL_TABLET | Freq: Two times a day (BID) | ORAL | Status: DC
  Administered 2018-02-17: 9.375 mg via ORAL
  Filled 2018-02-17 (×7): qty 3

## 2018-02-17 MED ORDER — INSULIN ASPART 100 UNIT/ML ~~LOC~~ SOLN: 0.0000 [IU] | Freq: Every day | SUBCUTANEOUS | Status: DC

## 2018-02-17 MED ORDER — INSULIN ASPART PROT & ASPART (70-30 MIX) 100 UNIT/ML ~~LOC~~ SUSP
45.0000 [IU] | Freq: Two times a day (BID) | SUBCUTANEOUS | Status: DC
  Administered 2018-02-17: 45 [IU] via SUBCUTANEOUS
  Filled 2018-02-17 (×2): qty 10

## 2018-02-17 MED ORDER — ASPIRIN EC 81 MG PO TBEC
81.0000 mg | DELAYED_RELEASE_TABLET | Freq: Every day | ORAL | Status: DC
  Administered 2018-02-17 – 2018-02-22 (×6): 81 mg via ORAL
  Filled 2018-02-17 (×6): qty 1

## 2018-02-17 MED ORDER — LORAZEPAM 2 MG/ML IJ SOLN
1.0000 mg | INTRAMUSCULAR | Status: DC | PRN
  Administered 2018-02-17 – 2018-02-20 (×5): 1 mg via INTRAVENOUS
  Filled 2018-02-17 (×5): qty 1

## 2018-02-17 NOTE — Progress Notes (Signed)
Pharmacy Antibiotic Note  Jeffery Nelson is a 72 y.o. male admitted on 02/17/2018 with cellulitis.  Pharmacy has been consulted for vancomycin dosing.  Plan: Vancomycin 2gm IV x 1 then 1gm IV q12 hours Check vanc trough when appropriate F/u renal function, cultures and clinical course  Height: 5\' 3"  (160 cm) Weight: 298 lb (135.2 kg) IBW/kg (Calculated) : 56.9  Temp (24hrs), Avg:98.2 F (36.8 C), Min:98.2 F (36.8 C), Max:98.2 F (36.8 C)  Recent Labs  Lab 02/17/18 0413  WBC 8.1  CREATININE 0.74    Estimated Creatinine Clearance: 104.1 mL/min (by C-G formula based on SCr of 0.74 mg/dL).    Allergies  Allergen Reactions  . Doxycycline     RECTAL BLEEDING  . Lipitor [Atorvastatin] Swelling  . Lyrica [Pregabalin] Swelling    Antimicrobials this admission: vanc 3/1 >>    Thank you for allowing pharmacy to be a part of this patient's care.  Excell Seltzer Poteet 02/17/2018 10:54 AM

## 2018-02-17 NOTE — ED Triage Notes (Signed)
Ems was called out for sob.  Pt denies cp

## 2018-02-17 NOTE — Progress Notes (Signed)
02/17/2018  3:45 AM  02/17/2018 9:29 AM  Jeffery Nelson was seen and examined.  The H&P by the admitting provider, orders, imaging was reviewed.  Please see new orders.  Cardiology and wound care has been consulted.  Start antibiotics for presumed cellulitis of RLE - see photos.  Will continue to follow.             Murvin Natal, MD [facility]

## 2018-02-17 NOTE — ED Triage Notes (Signed)
Pt arrived by EMS from home, complaining of SOB for the past few days. Pt says he has not been taking lasix like he should.

## 2018-02-17 NOTE — H&P (Signed)
History and Physical    [name] OAC:[id number] DOB: [birth year] DOA: 02/17/2018  PCP: [name], MD   Patient coming from: Home  Chief Complaint: Shortness of breath  HPI: Jeffery Nelson is a 72 y.o. male with medical history significant for nonischemic cardiomyopathy with AICD, chronic systolic and diastolic CHF, morbid obesity, type 2 diabetes, and restless leg syndrome who presented to the emergency department with worsening shortness of breath along with some wheezing that began earlier this week.  Patient states that he has been generally noncompliant with his Lasix over the last 1-2 months and states that he is noncompliant due to laziness.  He does claim, however that he is compliant with the remainder of his medications.  He states that some days he skips the medicine and other days he takes it only once a day when he has been prescribed it twice a day.  He states that his shortness of breath improves with sitting up and is generally worsened with laying flat.  He has a mild associated nonproductive cough. He denies any chest pain, palpitations, nausea, vomiting, or diaphoresis. He states that he has had worsening lower extremity edema bilaterally, but no warmth or pain aside from that related to his restless legs.   ED Course: Lab work is unremarkable and chest x-ray demonstrates some vascular congestion along with cardiomegaly.  BNP is 439.  He has been started on Lasix IV 80 mg for diuresis and appears to be having good urine output.  He has also been given a breathing treatment along with his Requip and Ativan for ongoing restless leg symptoms.  Review of Systems: All others reviewed and otherwise negative.  Past Medical History:  Diagnosis Date  . AICD (automatic cardioverter/defibrillator) present    a. 06/21/2016 SJM single lead AICD (ser # [id number]).  . Benign neoplasm of colon   . Chest pain    From rib fractures  . Chronic combined systolic and diastolic CHF  (congestive heart failure) ([place])    a. 01/2016 Echo: EF 20-25%, diff HK; b. 05/2016 Echo: EF 35-40%, diff HK, gr1 DD, diff HK, mild MR, mod dil LA/RA, mod TR, PASP 37mmHg.  [name] Gout   . Hypertensive heart disease with heart failure ([place])   . Lower extremity edema   . NICM (nonischemic cardiomyopathy) ([place])     01/2016 Echo: EF 20-25%, b. 01/2016 Cath: LM nl, LAD min irregs, D1 50ost, LCX min irregs, RCA min irregs, RPDA/RPL1 nl;  c. 05/2016 Echo: EF 35-40%;  d. 06/2016 s/p SJM single lead AICD (ser # [id number])..  . Non-obstructive CAD    a. 01/2016 Cath: LM nl, LAD min irregs, D1 50ost, LCX min irregs, RCA min irregs, RPDA/RPL1 nl.  . Polyneuropathy in diabetes(357.2)   . Psychosexual dysfunction with inhibited sexual excitement   . Pure hypercholesterolemia   . Restless legs syndrome (RLS)   . Sleep apnea    "probably" (06/21/2016)  . Sleep arousal disorder   . Syncope    a. Presumed to be 2/2 VT in setting of NICM-->s/p single lead SJM AICD in 06/2016.  . Type II diabetes mellitus ([place])   . Unspecified venous (peripheral) insufficiency     Past Surgical History:  Procedure Laterality Date  . CARDIAC CATHETERIZATION N/A 02/12/2016   Procedure: Right/Left Heart Cath and Coronary Angiography;  Surgeon: Pixie Casino, MD;  Location: [facility];  Service: Cardiovascular;  Laterality: N/A;  . CATARACT EXTRACTION W/ INTRAOCULAR LENS  IMPLANT, BILATERAL Bilateral  2009  . EP IMPLANTABLE DEVICE N/A 06/21/2016   Procedure: ICD Implant;  Surgeon: Evans Lance, MD;  Location: [facility];  Service: Cardiovascular;  Laterality: N/A;  . FIXATION KYPHOPLASTY LUMBAR SPINE  2008  . FRACTURE SURGERY    . INTRAMEDULLARY (IM) NAIL INTERTROCHANTERIC Left 09/21/2014   Procedure: left hip intertroch fracture;  Surgeon: [name], MD;  Location: [facility];  Service: Orthopedics;  Laterality: Left;  . LOWER EXTREMITY VENOUS DOPPLER  09/04/2012   Mild valvular insufficiency in the R Common Femoral vein w/  1.6 sec of duration of refliux  . TRANSTHORACIC ECHOCARDIOGRAM  09/04/2012   EF >55%, normal     reports that  has never smoked. he has never used smokeless tobacco. He reports that he does not drink alcohol or use drugs.  Allergies  Allergen Reactions  . Doxycycline     RECTAL BLEEDING  . Lipitor [Atorvastatin] Swelling  . Lyrica [Pregabalin] Swelling    Family History  Problem Relation Age of Onset  . Heart disease Mother   . Heart disease Father   . Heart disease Maternal Grandmother   . Heart disease Maternal Grandfather   . Stroke Paternal Grandmother   . Cancer Paternal Grandfather        Skin cancer  . Diabetes Brother     Prior to Admission medications   Medication Sig Start Date End Date Taking? Authorizing Provider  aspirin EC 81 MG tablet Take 81 mg by mouth daily.   Yes [provider]  carvedilol (COREG) 6.25 MG tablet Take 1.5 tablets (9.375 mg total) by mouth 2 (two) times daily. 01/13/17  Yes Barrett, Evelene Croon, PA-C  colchicine 0.6 MG tablet Take 0.6 mg by mouth as needed (for gout).    Yes [provider]  furosemide (LASIX) 40 MG tablet Take 2 tablets (80 mg total) by mouth 2 (two) times daily. 08/23/17  Yes Hilty, Nadean Corwin, MD  HUMALOG MIX 75/25 (75-25) 100 UNIT/ML SUSP injection Inject 55-70 Units as directed daily. Inject 70 units at breakfast and dinner, and 55 units at lunch time 05/03/15  Yes [provider]  potassium chloride SA (KLOR-CON M20) 20 MEQ tablet Take 1 tablet (20 mEq total) by mouth 2 (two) times daily. 04/08/17  Yes Hilty, Nadean Corwin, MD  quinapril (ACCUPRIL) 20 MG tablet Take 1 tablet (20 mg total) by mouth 2 (two) times daily. 08/25/17  Yes Hilty, Nadean Corwin, MD  rOPINIRole (REQUIP) 4 MG tablet Take 4 mg by mouth daily.    Yes [provider]  rosuvastatin (CRESTOR) 20 MG tablet Take 20 mg by mouth daily.   Yes [provider]  fluocinonide cream (LIDEX) 0.27 % Apply 1 application topically as needed  (rash).    [provider]  FLUoxetine (PROZAC) 20 MG tablet Take 20 mg by mouth daily.    [provider]    Physical Exam: Vitals:   02/17/18 0353 02/17/18 0400 02/17/18 0413 02/17/18 0430  BP:  (!) 153/93  (!) 151/101  Pulse:  (!) 113  (!) 123  Resp:  14    Temp:      TempSrc:      SpO2:  92% 94% 96%  Weight: 135.2 kg (298 lb)     Height: 5\' 3"  (1.6 m)       Constitutional: NAD, he is restless with his leg pain; morbidly obese Vitals:   02/17/18 0353 02/17/18 0400 02/17/18 0413 02/17/18 0430  BP:  (!) 153/93  [name])  151/101  Pulse:  (!) 113  (!) 123  Resp:  14    Temp:      TempSrc:      SpO2:  92% 94% 96%  Weight: 135.2 kg (298 lb)     Height: 5\' 3"  (1.6 m)      Eyes: lids and conjunctivae normal ENMT: Mucous membranes are moist.  Neck: normal, supple Respiratory: clear to auscultation bilaterally. Normal respiratory effort. No accessory muscle use.  On 2.5 L nasal cannula Cardiovascular: Regular rate and rhythm, no murmurs. No extremity edema. Abdomen: no tenderness, no distention. Bowel sounds positive.  Musculoskeletal:  No joint deformity upper and lower extremities.   Skin: Bilateral lower extremity venous stasis ulcers that do not appear overtly cellulitic, no drainage or cuts noted; no excessive warmth. Psychiatric: Normal judgment and insight. Alert and oriented x 3. Normal mood.   Labs on Admission: I have personally reviewed following labs and imaging studies  CBC: Recent Labs  Lab 02/17/18 0413  WBC 8.1  NEUTROABS 5.8  HGB 10.8*  HCT 34.3*  MCV 82.9  PLT 638   Basic Metabolic Panel: Recent Labs  Lab 02/17/18 0413  NA 136  K 4.0  CL 99*  CO2 25  GLUCOSE 219*  BUN 25*  CREATININE 0.74  CALCIUM 8.2*   GFR: Estimated Creatinine Clearance: 104.1 mL/min (by C-G formula based on SCr of 0.74 mg/dL). Liver Function Tests: No results for input(s): AST, ALT, ALKPHOS, BILITOT, PROT, ALBUMIN in the last 168 hours. No results for  input(s): LIPASE, AMYLASE in the last 168 hours. No results for input(s): AMMONIA in the last 168 hours. Coagulation Profile: No results for input(s): INR, PROTIME in the last 168 hours. Cardiac Enzymes: Recent Labs  Lab 02/17/18 0413  TROPONINI <0.03   BNP (last 3 results) No results for input(s): PROBNP in the last 8760 hours. HbA1C: No results for input(s): HGBA1C in the last 72 hours. CBG: No results for input(s): GLUCAP in the last 168 hours. Lipid Profile: No results for input(s): CHOL, HDL, LDLCALC, TRIG, CHOLHDL, LDLDIRECT in the last 72 hours. Thyroid Function Tests: No results for input(s): TSH, T4TOTAL, FREET4, T3FREE, THYROIDAB in the last 72 hours. Anemia Panel: No results for input(s): VITAMINB12, FOLATE, FERRITIN, TIBC, IRON, RETICCTPCT in the last 72 hours. Urine analysis:    Component Value Date/Time   COLORURINE AMBER BIOCHEMICALS MAY BE AFFECTED BY COLOR (A) 08/01/2010 2020   APPEARANCEUR CLOUDY (A) 08/01/2010 2020   LABSPEC 1.026 08/01/2010 2020   PHURINE 5.0 08/01/2010 2020   GLUCOSEU 100 (A) 08/01/2010 2020   HGBUR NEGATIVE 08/01/2010 2020   BILIRUBINUR SMALL (A) 08/01/2010 2020   KETONESUR TRACE (A) 08/01/2010 2020   PROTEINUR NEGATIVE 08/01/2010 2020   UROBILINOGEN 0.2 08/01/2010 2020   NITRITE NEGATIVE 08/01/2010 2020   LEUKOCYTESUR  08/01/2010 2020    NEGATIVE MICROSCOPIC NOT DONE ON URINES WITH NEGATIVE PROTEIN, BLOOD, LEUKOCYTES, NITRITE, OR GLUCOSE <1000 mg/dL.    Radiological Exams on Admission: Dg Chest Port 1 View  Result Date: 02/17/2018 CLINICAL DATA:  73 year old male with shortness of breath. EXAM: PORTABLE CHEST 1 VIEW COMPARISON:  Chest radiograph dated 06/22/2016 FINDINGS: There is cardiomegaly with vascular congestion. Shallow inspiration with bibasilar atelectasis. No focal consolidation, pleural effusion, or pneumothorax. Left pectoral pacemaker device. No acute osseous pathology. IMPRESSION: Cardiomegaly with mild vascular  congestion.  No focal consolidation. Electronically Signed   By: Anner Crete M.D.   On: 02/17/2018 04:29    EKG: Independently reviewed.  Sinus tachycardia  with incomplete left bundle branch block.  Assessment/Plan Principal Problem:   Acute on chronic combined systolic and diastolic CHF (congestive heart failure) (HCC) Active Problems:   Morbid obesity (H[facility])   Restless legs syndrome (RLS)   Non-ischemic cardiomyopathy ([facility])   Type II diabetes mellitus ([place])   AICD (automatic cardioverter/defibrillator) present   Venous stasis dermatitis of both lower extremities    1. Acute on chronic combined systolic and diastolic CHF secondary to Lasix noncompliance.  Continue on Lasix 80 mg IV twice daily with good initial response noted thus far.  Daily weights and I's and O's with fluid restriction.  Patient does follow-up with cardiologist Dr. Lovena Le and I see no need to order 2D echocardiogram at this time as he appears to have a straightforward history. 2. Restless leg syndrome.  Continue on home Requip and I will also order some Ativan as needed to help him relax. 3. Bilateral venous stasis dermatitis.  Wound care consultation to assist with dressings as needed.  He does not appear to have cellulitis on clinical examination nor is there significant leukocytosis and I will avoid antibiotics. 4. Nonischemic cardiomyopathy with AICD.  Continue on Coreg and monitor on telemetry. 5. Type 2 diabetes with hyperglycemia.  I suspect that he is likely noncompliant and will check a hemoglobin A1c.  Sliding scale insulin along with continuation of home insulin.   DVT prophylaxis: Lovenox Code Status: Full Family Communication: None; lives alone Disposition Plan:Likely home by AM after diuresis Consults called:None Admission status: Obs, tele   Labaron Digirolamo Darleen Crocker DO [facility] Pager [phone]  If 7PM-7AM, please contact night-coverage [email or web address] Password [facility]  02/17/2018, 5:49 AM

## 2018-02-17 NOTE — Consult Note (Addendum)
Cardiology Consult    Patient ID: Jeffery Nelson; [id number]; [birth year]   Admit date: 02/17/2018 Date of Consult: 02/17/2018  Primary Care Provider: [name], MD Primary Cardiologist: Pixie Casino, MD  Primary Electrophysiologist: Dr. Lovena Le  Patient Profile    Jeffery Nelson is a 72 y.o. male with past medical history of CAD (nonosbtructive CAD by cath in 01/2016), chronic combined systolic and diastolic CHF (EF previously 20%, improved to 35-40% by echo in 05/2016), nonischemic cardiomyopathy (s/p St. Jude ICD placement in 06/2016), IDDM, HTN, HLD, and morbid obesity who is being seen today for the evaluation of CHF at the request of Dr. Wynetta Emery.   History of Present Illness    Jeffery Nelson was last examined by Dr. Debara Pickett in 08/2017 and reported not taking his medications for the past 2 weeks due to financial issues. Was noted to have significant edema at the time of his visit and weight was 299 lbs at that time (previously 279 in 03/2017). He was restarted on Lasix 80mg  BID at that time.   He presented to [facility] ED on 02/17/2018 for evaluation of worsening dyspnea over the past several days. He reports having worsening dyspnea on exertion, orthopnea, and edema over the past few weeks. He has not been compliant with Lasix dosing as he takes this sometimes only once daily while skipping it entirely other days. He has been able to afford the medication but says he has been providing transportation for neighbors very regularly and skips his morning dose to avoid the frequent urination. He denies any recent chest pain, palpitations, dizziness, or presyncope. Unsure of baseline weight as he has not been weighing himself daily.   Initial labs show WBC 8.1, Hgb 10.8, platelets 169, Na+ 136, K+ 4.0, and creatinine 0.74. BNP 439. Initial troponin negative. EKG shows sinus tachycardia, HR 126, with incomplete RBBB and wandering baseline. CXR shows cardiomegaly with mild vascular congestion  and no focal consolidation.   He has been admitted and started on IV Lasix 80mg  BID. Also placed on Vancomycin for cellulitis.    Past Medical History:  Diagnosis Date  . AICD (automatic cardioverter/defibrillator) present    a. 06/21/2016 SJM single lead AICD (ser # [id number]).  . Benign neoplasm of colon   . Chest pain    From rib fractures  . Chronic combined systolic and diastolic CHF (congestive heart failure) ([place])    a. 01/2016 Echo: EF 20-25%, diff HK; b. 05/2016 Echo: EF 35-40%, diff HK, gr1 DD, diff HK, mild MR, mod dil LA/RA, mod TR, PASP 87mmHg.  [name] Gout   . Hypertensive heart disease with heart failure ([place])   . Lower extremity edema   . NICM (nonischemic cardiomyopathy) ([place])     01/2016 Echo: EF 20-25%, b. 01/2016 Cath: LM nl, LAD min irregs, D1 50ost, LCX min irregs, RCA min irregs, RPDA/RPL1 nl;  c. 05/2016 Echo: EF 35-40%;  d. 06/2016 s/p SJM single lead AICD (ser # [id number])..  . Non-obstructive CAD    a. 01/2016 Cath: LM nl, LAD min irregs, D1 50ost, LCX min irregs, RCA min irregs, RPDA/RPL1 nl.  . Polyneuropathy in diabetes(357.2)   . Psychosexual dysfunction with inhibited sexual excitement   . Pure hypercholesterolemia   . Restless legs syndrome (RLS)   . Sleep apnea    "probably" (06/21/2016)  . Sleep arousal disorder   . Syncope    a. Presumed to be 2/2 VT in setting of NICM-->s/p single lead SJM AICD in  06/2016.  . Type II diabetes mellitus ([place])   . Unspecified venous (peripheral) insufficiency     Past Surgical History:  Procedure Laterality Date  . CARDIAC CATHETERIZATION N/A 02/12/2016   Procedure: Right/Left Heart Cath and Coronary Angiography;  Surgeon: Pixie Casino, MD;  Location: [facility];  Service: Cardiovascular;  Laterality: N/A;  . CATARACT EXTRACTION W/ INTRAOCULAR LENS  IMPLANT, BILATERAL Bilateral 2009  . EP IMPLANTABLE DEVICE N/A 06/21/2016   Procedure: ICD Implant;  Surgeon: Evans Lance, MD;  Location: [place] [facility];  Service:  Cardiovascular;  Laterality: N/A;  . FIXATION KYPHOPLASTY LUMBAR SPINE  2008  . FRACTURE SURGERY    . INTRAMEDULLARY (IM) NAIL INTERTROCHANTERIC Left 09/21/2014   Procedure: left hip intertroch fracture;  Surgeon: [name], MD;  Location: [facility] [facility];  Service: Orthopedics;  Laterality: Left;  . LOWER EXTREMITY VENOUS DOPPLER  09/04/2012   Mild valvular insufficiency in the R Common Femoral vein w/ 1.6 sec of duration of refliux  . TRANSTHORACIC ECHOCARDIOGRAM  09/04/2012   EF >55%, normal     Home Medications:  Prior to Admission medications   Medication Sig Start Date End Date Taking? Authorizing Provider  aspirin EC 81 MG tablet Take 81 mg by mouth daily.   Yes [provider]  FLUoxetine (PROZAC) 20 MG tablet Take 20 mg by mouth daily.   Yes [provider]  furosemide (LASIX) 40 MG tablet Take 2 tablets (80 mg total) by mouth 2 (two) times daily. 08/23/17  Yes Hilty, Nadean Corwin, MD  HUMALOG MIX 75/25 (75-25) 100 UNIT/ML SUSP injection Inject 55-70 Units as directed daily. Inject 70 units at breakfast and dinner, and 55 units at lunch time 05/03/15  Yes [provider]  potassium chloride SA (KLOR-CON M20) 20 MEQ tablet Take 1 tablet (20 mEq total) by mouth 2 (two) times daily. 04/08/17  Yes Hilty, Nadean Corwin, MD  quinapril (ACCUPRIL) 20 MG tablet Take 1 tablet (20 mg total) by mouth 2 (two) times daily. 08/25/17  Yes Hilty, Nadean Corwin, MD  rOPINIRole (REQUIP) 4 MG tablet Take 4 mg by mouth daily.    Yes [provider]  vitamin B-12 (CYANOCOBALAMIN) 1000 MCG tablet Take 1,000 mcg by mouth daily.   Yes [provider]    Inpatient Medications: Scheduled Meds: . aspirin EC  81 mg Oral Daily  . benazepril  20 mg Oral BID  . carvedilol  9.375 mg Oral BID  . enoxaparin (LOVENOX) injection  40 mg Subcutaneous Q24H  . FLUoxetine  20 mg Oral Daily  . furosemide  80 mg Intravenous BID  . insulin aspart  0-20 Units Subcutaneous TID WC  . insulin aspart  0-5  Units Subcutaneous QHS  . insulin aspart protamine- aspart  45 Units Subcutaneous BID PC  . potassium chloride SA  30 mEq Oral BID  . rOPINIRole  4 mg Oral Daily  . rosuvastatin  20 mg Oral Daily  . sodium chloride flush  3 mL Intravenous Q12H   Continuous Infusions: . sodium chloride     PRN Meds: sodium chloride, acetaminophen **OR** acetaminophen, colchicine, fluocinonide cream, ipratropium-albuterol, LORazepam, ondansetron **OR** ondansetron (ZOFRAN) IV, sodium chloride flush  Allergies:    Allergies  Allergen Reactions  . Doxycycline     RECTAL BLEEDING  . Lipitor [Atorvastatin] Swelling  . Lyrica [Pregabalin] Swelling    Social History:   Social History   Socioeconomic History  . Marital status: Widowed    Spouse name: Not on  file  . Number of children: Not on file  . Years of education: Not on file  . Highest education level: Not on file  Social Needs  . Financial resource strain: Not on file  . Food insecurity - worry: Not on file  . Food insecurity - inability: Not on file  . Transportation needs - medical: Not on file  . Transportation needs - non-medical: Not on file  Occupational History  . Not on file  Tobacco Use  . Smoking status: Never Smoker  . Smokeless tobacco: Never Used  Substance and Sexual Activity  . Alcohol use: No  . Drug use: No  . Sexual activity: Yes  Other Topics Concern  . Not on file  Social History Narrative  . Not on file     Family History:    Family History  Problem Relation Age of Onset  . Heart disease Mother   . Heart disease Father   . Heart disease Maternal Grandmother   . Heart disease Maternal Grandfather   . Stroke Paternal Grandmother   . Cancer Paternal Grandfather        Skin cancer  . Diabetes Brother       Review of Systems    General:  No chills, fever, night sweats or weight changes.  Cardiovascular:  No chest pain, palpitations, paroxysmal nocturnal dyspnea. Positive for dyspnea on exertion,  edema, and orthopnea.  Dermatological: No rash, lesions/masses Respiratory: No cough, Positive for dyspnea.  Urologic: No hematuria, dysuria Abdominal:   No nausea, vomiting, diarrhea, bright red blood per rectum, melena, or hematemesis Neurologic:  No visual changes, wkns, changes in mental status.  All other systems reviewed and are otherwise negative except as noted above.  Physical Exam/Data    Vitals:   02/17/18 0723 02/17/18 0730 02/17/18 0800 02/17/18 0830  BP: 104/61 (!) 99/54 108/60 102/62  Pulse: 91 88  (!) 42  Resp: 20  20 (!) 21  Temp:      TempSrc:      SpO2: 90% 90%  (!) 84%  Weight:      Height:        Intake/Output Summary (Last 24 hours) at 02/17/2018 0940 Last data filed at 02/17/2018 0655 Gross per 24 hour  Intake -  Output 850 ml  Net -850 ml   Filed Weights   02/17/18 0353  Weight: 298 lb (135.2 kg)   Body mass index is 52.79 kg/m.   General: Pleasant, Caucasian male appearing in NAD. Psych: Normal affect. Neuro: Alert and oriented X 3. Moves all extremities spontaneously. HEENT: Normal  Neck: Supple without bruits. JVD at 10cm. Lungs:  Resp regular and unlabored, rales along bases bilaterally. Heart: RRR no s3, s4, or murmurs. Abdomen: Soft, non-tender, non-distended, BS + x 4.  Extremities: No clubbing or cyanosis. 2+ pitting edema up to knees bilaterally with erythema and drainage noted. DP/PT/Radials 1+ and equal bilaterally.   EKG:  The EKG was personally reviewed and demonstrates: Sinus tachycardia, HR 126, with incomplete RBBB and wandering baseline.  Telemetry:  Telemetry was personally reviewed and demonstrates: NSR, HR in 70's to 110's; Occasional PVC's.    Labs/Studies     Relevant CV Studies:  Echocardiogram: 05/2016 Study Conclusions  - Left ventricle: The cavity size was normal. Wall thickness was   increased in a pattern of mild LVH. Systolic function was   moderately reduced. The estimated ejection fraction was in the    range of 35% to 40%. Diffuse hypokinesis. Doppler parameters are  consistent with abnormal left ventricular relaxation (grade 1   diastolic dysfunction). The E/e&' ratio is >15, suggesting   elevated LV filling pressure. Ejection fraction (MOD, 2-plane):   38%. - Mitral valve: Mildly thickened leaflets . There was mild   regurgitation. - Left atrium: Moderately dilated. - Right ventricle: The cavity size was normal. Systolic function   was normal. Lateral annulus peak S velocity: 17.2 cm/s. - Right atrium: The atrium was mildly dilated. - Tricuspid valve: There was moderate regurgitation. - Pulmonary arteries: PA peak pressure: 40 mm Hg (S). - Inferior vena cava: The vessel was normal in size. The   respirophasic diameter changes were in the normal range (>= 50%),   consistent with normal central venous pressure.  Impressions:  - Compared to the prior study in 01/2016, the LVEF has improved to   35-40%.  Cardiac Catheterization: 01/2016  Ost 1st Diag to 1st Diag lesion, 50% stenosed.  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 Moderate pulmonary venous hypertension, reduced cardiac index. Elevated PCWP. Will need additional diuresis and medication adjustment for non-ischemic cardiomyopathy as an outpatient.   Laboratory Data:  Chemistry Recent Labs  Lab 02/17/18 0413  NA 136  K 4.0  CL 99*  CO2 25  GLUCOSE 219*  BUN 25*  CREATININE 0.74  CALCIUM 8.2*  GFRNONAA >60  GFRAA >60  ANIONGAP 12    No results for input(s): PROT, ALBUMIN, AST, ALT, ALKPHOS, BILITOT in the last 168 hours. Hematology Recent Labs  Lab 02/17/18 0413  WBC 8.1  RBC 4.14*  HGB 10.8*  HCT 34.3*  MCV 82.9  MCH 26.1  MCHC 31.5  RDW 15.0  PLT 169   Cardiac Enzymes Recent Labs  Lab 02/17/18 0413  TROPONINI <0.03   No results for input(s): TROPIPOC in the last  168 hours.  BNP Recent Labs  Lab 02/17/18 0413  BNP 439.0*    DDimer No results for input(s): DDIMER in the last 168 hours.  Radiology/Studies:  Dg Chest Port 1 View  Result Date: 02/17/2018 CLINICAL DATA:  72 year old male with shortness of breath. EXAM: PORTABLE CHEST 1 VIEW COMPARISON:  Chest radiograph dated 06/22/2016 FINDINGS: There is cardiomegaly with vascular congestion. Shallow inspiration with bibasilar atelectasis. No focal consolidation, pleural effusion, or pneumothorax. Left pectoral pacemaker device. No acute osseous pathology. IMPRESSION: Cardiomegaly with mild vascular congestion.  No focal consolidation. Electronically Signed   By: Anner Crete M.D.   On: 02/17/2018 04:29     Assessment & Plan    1. Acute on Chronic Combined Systolic and Diastolic CHF - the patient has a known reduced EF of  35-40% by echo in 05/2016. Presents with worsening dyspnea on exertion, orthopnea, and edema over the past few weeks in the setting of medication noncompliance and skipping Lasix doses on a daily basis. -  BNP elevated to 439. CXR shows cardiomegaly with mild vascular congestion and no focal consolidation. He has been started on IV Lasix 80mg  BID. Monitor I&O's along with daily weights. May need transition to Lasix drip pending response to Lasix dosing.  - continue Coreg 9.375mg  BID and Benazepril 20mg  BID. May need to hold ACE-I pending BP response.   2. Nonischemic Cardiomyopathy - known reduced EF as outlined above with catheterization in 01/2016 showing nonobstructive CAD. - s/p St. Jude ICD placement in 06/2016. Followed by Dr. Lovena Le with device check in 12/2017 showing normal device function.  3. HTN - BP variable at 98/54 - 153/102 since admission, stable at 118/64 on most recent check.  - continue Coreg and Benazepril. May need to hold Benazepril pending BP response to diuresis.   4. HLD - Lipid Panel in 01/2016 showed total cholesterol of 114, HDL 42, and LDL  52. - remains on Crestor 20mg  daily.  5. IDDM - reports compliance with insulin regimen prior to admission. Hgb A1c pending.   6. Cellulitis - significant erythema and weeping of lower extremities noted on examination.  - has been started on Vancomycin. Per admitting team.     For questions or updates, please contact [place] Please consult [email or web address] for contact info under Cardiology/STEMI.  Signed, Erma Heritage, PA-C 02/17/2018, 9:40 AM Pager: [phone]  The patient was seen and examined, and I agree with the history, physical exam, assessment and plan as documented above, with modifications as noted below. I have also personally reviewed all relevant documentation, old records, labs, and both radiographic and cardiovascular studies. I have also independently interpreted old and new ECG's.  Briefly, this is a 72 year old male who is followed by Dr. Debara Pickett, most recently in September 2018.  Past medical history includes chronic combined systolic and diastolic heart failure, nonischemic cardiomyopathy status post [facility] Jude ICD placement in June 2017, insulin dependent diabetes mellitus, hypertension, hyperlipidemia, morbid obesity, and medication noncompliance.  He does not take his Lasix on a daily basis and often skips several doses for several days.  He began to notice progressive bilateral lower extremity swelling about 4 weeks ago.  He has noticed progressive shortness of breath over the past 2 weeks.  He normally sleeps with a wedge but has had to incline his head further over the last week to improve his breathing.  He had weighed as low as 279 pounds in April 2018 and was up to 299 pounds at the time of his last office visit in September 2018.  On our scales, he weighs 298 pounds.  He has been receiving IV Lasix and he has already experienced symptomatic improvement.  Blood pressures have ranged from systolic 79-390.  Most recent blood pressure recorded is  118/64.  Continue carvedilol and ACE inhibitor.  Physical exam notable for bilateral lower extremity lipodermatosclerosis secondary to chronic venous hypertension and superimposed CHF.  The absence of leukocytosis and given that he is afebrile here argues against cellulitis.  Statistically speaking, cellulitis is also unilateral.  Echocardiography is not indicated at this time.  His current situation is due to medication noncompliance.  Kate Sable, MD, [facility]  02/17/2018 11:07 AM

## 2018-02-17 NOTE — ED Provider Notes (Signed)
[facility] EMERGENCY DEPARTMENT Provider Note   CSN: [id number] Arrival date & time: 02/17/18  0345     History   Chief Complaint Chief Complaint  Patient presents with  . Shortness of Breath    HPI Jeffery Nelson is a 72 y.o. male.  The history is provided by the patient.  Shortness of Breath  This is a new problem. The problem occurs frequently.The current episode started 2 days ago. The problem has been gradually worsening. Associated symptoms include cough, wheezing and leg swelling. Pertinent negatives include no fever, no chest pain and no vomiting. He has tried nothing for the symptoms. Associated medical issues include heart failure.   Patient with history of morbid obesity, nonischemic cardiomyopathy presents with worsening shortness of breath of the past 2 days.  He also reports worsening lower extremity edema.  No fevers or vomiting.  No flulike illness reported.  No significant chest pain reported, he does admit to not taking Lasix as he is prescribed Patient has an ICD in place, denies any recent issues Past Medical History:  Diagnosis Date  . AICD (automatic cardioverter/defibrillator) present    a. 06/21/2016 SJM single lead AICD (ser # 1829937).  . Benign neoplasm of colon   . Chest pain    From rib fractures  . Chronic combined systolic and diastolic CHF (congestive heart failure) ([place])    a. 01/2016 Echo: EF 20-25%, diff HK; b. 05/2016 Echo: EF 35-40%, diff HK, gr1 DD, diff HK, mild MR, mod dil LA/RA, mod TR, PASP 42mmHg.  [name] Gout   . Hypertensive heart disease with heart failure ([place])   . Lower extremity edema   . NICM (nonischemic cardiomyopathy) ([place])     01/2016 Echo: EF 20-25%, b. 01/2016 Cath: LM nl, LAD min irregs, D1 50ost, LCX min irregs, RCA min irregs, RPDA/RPL1 nl;  c. 05/2016 Echo: EF 35-40%;  d. 06/2016 s/p SJM single lead AICD (ser # [id number])..  . Non-obstructive CAD    a. 01/2016 Cath: LM nl, LAD min irregs, D1 50ost, LCX min irregs, RCA min irregs,  RPDA/RPL1 nl.  . Polyneuropathy in diabetes(357.2)   . Psychosexual dysfunction with inhibited sexual excitement   . Pure hypercholesterolemia   . Restless legs syndrome (RLS)   . Sleep apnea    "probably" (06/21/2016)  . Sleep arousal disorder   . Syncope    a. Presumed to be 2/2 VT in setting of NICM-->s/p single lead SJM AICD in 06/2016.  . Type II diabetes mellitus ([place])   . Unspecified venous (peripheral) insufficiency     Patient Active Problem List   Diagnosis Date Noted  . Hx of medication noncompliance 08/23/2017  . Encounter for long-term (current) use of medications 04/01/2017  . Syncope   . Type II diabetes mellitus ([place])   . Non-obstructive CAD   . NICM (nonischemic cardiomyopathy) ([place])   . Hypertensive heart disease with heart failure ([place])   . Acute on chronic combined systolic and diastolic CHF (congestive heart failure) ([place])   . AICD (automatic cardioverter/defibrillator) present   . Acute on chronic systolic congestive heart failure ([place]) 06/21/2016  . Syncope and collapse 06/03/2016  . Non-ischemic cardiomyopathy ([place]) 06/03/2016  . Candida rash of groin 02/12/2016  . Cardiomyopathy ([place]) 02/04/2016  . Swelling of lower extremity 01/18/2016  . Shortness of breath 01/18/2016  . Edema 10/08/2014  . Intertrochanteric fx-closed ([place]) 10/04/2014  . S/P total hip arthroplasty 10/04/2014  . Diabetes mellitus type 2, controlled, with complications ([place])   .  Pure hypercholesterolemia   . Hypertension   . Polyneuropathy in diabetes(357.2)   . Restless legs syndrome (RLS)   . Hip fracture ([place]) 09/21/2014  . Morbid obesity ([place]) 10/04/2013  . History of shingles 10/04/2013  . Varicose veins 10/04/2013  . Needs sleep apnea assessment 10/04/2013    Past Surgical History:  Procedure Laterality Date  . CARDIAC CATHETERIZATION N/A 02/12/2016   Procedure: Right/Left Heart Cath and Coronary Angiography;  Surgeon: Pixie Casino, MD;  Location: [facility];   Service: Cardiovascular;  Laterality: N/A;  . CATARACT EXTRACTION W/ INTRAOCULAR LENS  IMPLANT, BILATERAL Bilateral 2009  . EP IMPLANTABLE DEVICE N/A 06/21/2016   Procedure: ICD Implant;  Surgeon: Evans Lance, MD;  Location: [facility];  Service: Cardiovascular;  Laterality: N/A;  . FIXATION KYPHOPLASTY LUMBAR SPINE  2008  . FRACTURE SURGERY    . INTRAMEDULLARY (IM) NAIL INTERTROCHANTERIC Left 09/21/2014   Procedure: left hip intertroch fracture;  Surgeon: [name], MD;  Location: [facility];  Service: Orthopedics;  Laterality: Left;  . LOWER EXTREMITY VENOUS DOPPLER  09/04/2012   Mild valvular insufficiency in the R Common Femoral vein w/ 1.6 sec of duration of refliux  . TRANSTHORACIC ECHOCARDIOGRAM  09/04/2012   EF >55%, normal       Home Medications    Prior to Admission medications   Medication Sig Start Date End Date Taking? Authorizing Provider  aspirin EC 81 MG tablet Take 81 mg by mouth daily.   Yes [provider]  carvedilol (COREG) 6.25 MG tablet Take 1.5 tablets (9.375 mg total) by mouth 2 (two) times daily. 01/13/17  Yes Barrett, Evelene Croon, PA-C  colchicine 0.6 MG tablet Take 0.6 mg by mouth as needed (for gout).    Yes [provider]  furosemide (LASIX) 40 MG tablet Take 2 tablets (80 mg total) by mouth 2 (two) times daily. 08/23/17  Yes Hilty, Nadean Corwin, MD  HUMALOG MIX 75/25 (75-25) 100 UNIT/ML SUSP injection Inject 55-70 Units as directed daily. Inject 70 units at breakfast and dinner, and 55 units at lunch time 05/03/15  Yes [provider]  potassium chloride SA (KLOR-CON M20) 20 MEQ tablet Take 1 tablet (20 mEq total) by mouth 2 (two) times daily. 04/08/17  Yes Hilty, Nadean Corwin, MD  quinapril (ACCUPRIL) 20 MG tablet Take 1 tablet (20 mg total) by mouth 2 (two) times daily. 08/25/17  Yes Hilty, Nadean Corwin, MD  rOPINIRole (REQUIP) 4 MG tablet Take 4 mg by mouth daily.    Yes [provider]  rosuvastatin (CRESTOR) 20 MG tablet Take 20 mg  by mouth daily.   Yes [provider]  fluocinonide cream (LIDEX) 7.90 % Apply 1 application topically as needed (rash).    [provider]  FLUoxetine (PROZAC) 20 MG tablet Take 20 mg by mouth daily.    [provider]    Family History Family History  Problem Relation Age of Onset  . Heart disease Mother   . Heart disease Father   . Heart disease Maternal Grandmother   . Heart disease Maternal Grandfather   . Stroke Paternal Grandmother   . Cancer Paternal Grandfather        Skin cancer  . Diabetes Brother     Social History Social History   Tobacco Use  . Smoking status: Never Smoker  . Smokeless tobacco: Never Used  Substance Use Topics  . Alcohol use: No  . Drug use: No     Allergies  Doxycycline; Lipitor [atorvastatin]; and Lyrica [pregabalin]   Review of Systems Review of Systems  Constitutional: Negative for fever.  Respiratory: Positive for cough, shortness of breath and wheezing.   Cardiovascular: Positive for leg swelling. Negative for chest pain.  Gastrointestinal: Negative for vomiting.  All other systems reviewed and are negative.    Physical Exam Updated Vital Signs BP (!) 153/93   Pulse (!) 113   Temp 98.2 F (36.8 C) (Oral)   Resp 14   Ht 1.6 m (5\' 3" )   Wt 135.2 kg (298 lb)   SpO2 94%   BMI 52.79 kg/m   Physical Exam CONSTITUTIONAL: Disheveled, chronically ill-appearing, anxious HEAD: Normocephalic/atraumatic EYES: EOMI/PERRL ENMT: Mucous membranes moist NECK: supple no meningeal signs SPINE/BACK:entire spine nontender CV: S1/S2 noted LUNGS: Limited due to body size, previous crackles noted with associated wheeze ABDOMEN: soft, nontender, obese and limited due to body habitus GU:no cva tenderness NEURO: Pt is awake/alert/appropriate, moves all extremitiesx4.  No facial droop.   EXTREMITIES: Symmetric lower extremity edema noted, venous stasis noted in both legs. SKIN: warm, color normal PSYCH:  Anxious   ED Treatments / Results  Labs (all labs ordered are listed, but only abnormal results are displayed) Labs Reviewed  BASIC METABOLIC PANEL - Abnormal; Notable for the following components:      Result Value   Chloride 99 (*)    Glucose, Bld 219 (*)    BUN 25 (*)    Calcium 8.2 (*)    All other components within normal limits  CBC WITH DIFFERENTIAL/PLATELET - Abnormal; Notable for the following components:   RBC 4.14 (*)    Hemoglobin 10.8 (*)    HCT 34.3 (*)    Monocytes Absolute 1.1 (*)    All other components within normal limits  BRAIN NATRIURETIC PEPTIDE - Abnormal; Notable for the following components:   B Natriuretic Peptide 439.0 (*)    All other components within normal limits  TROPONIN I    EKG  EKG Interpretation  Date/Time:  Friday February 17 2018 03:53:59 EST Ventricular Rate:  126 PR Interval:    QRS Duration: 111 QT Interval:  329 QTC Calculation: 477 R Axis:   9 Text Interpretation:  rhythm indeterminate, suspect sinus tach Incomplete left bundle branch block Borderline prolonged QT interval Baseline wander in lead(s) I II III aVR aVF V1 V3 V4 V5 V6 Confirmed by Ripley Fraise ([id number]) on 02/17/2018 3:58:44 AM       Radiology Dg Chest Port 1 View  Result Date: 02/17/2018 CLINICAL DATA:  72 year old male with shortness of breath. EXAM: PORTABLE CHEST 1 VIEW COMPARISON:  Chest radiograph dated 06/22/2016 FINDINGS: There is cardiomegaly with vascular congestion. Shallow inspiration with bibasilar atelectasis. No focal consolidation, pleural effusion, or pneumothorax. Left pectoral pacemaker device. No acute osseous pathology. IMPRESSION: Cardiomegaly with mild vascular congestion.  No focal consolidation. Electronically Signed   By: Anner Crete M.D.   On: 02/17/2018 04:29    Procedures Procedures (including critical care time)  Medications Ordered in ED Medications  albuterol (PROVENTIL) (2.5 MG/3ML) 0.083% nebulizer solution 5 mg (5 mg  Nebulization Given 02/17/18 0412)  LORazepam (ATIVAN) injection 1 mg (1 mg Intravenous Given 02/17/18 0439)  rOPINIRole (REQUIP) tablet 4 mg (4 mg Oral Given 02/17/18 0509)  furosemide (LASIX) injection 80 mg (80 mg Intravenous Given 02/17/18 0441)  nitroGLYCERIN (NITROGLYN) 2 % ointment 1 inch (1 inch Topical Given 02/17/18 0529)     Initial Impression / Assessment and Plan / ED Course  I have  reviewed the triage vital signs and the nursing notes.  Pertinent labs & imaging results that were available during my care of the patient were reviewed by me and considered in my medical decision making (see chart for details).     5:09 AM Patient with probable worsening CHF.  Is also reporting restless leg syndrome and is requesting his Requip which usually help with this Workup pending 5:42 AM Patient admits to medical noncompliance, reports he is just too "lazy" to take his Lasix. Pt with likely Heart failure exacerbation He would need to be admitted for blood pressure control, and diuresis. Discussed with Dr. Manuella Ghazi for admission Final Clinical Impressions(s) / ED Diagnoses   Final diagnoses:  Acute systolic congestive heart failure [facility])  Hyperglycemia    ED Discharge Orders    None       Ripley Fraise, MD 02/17/18 [phone]

## 2018-02-17 NOTE — Consult Note (Addendum)
[place] [facility] Nurse wound consult note Pt states he is followed by a physician for his legs prior to admission but is unable to provide their name or the topical treatment which was previously ordered prior to admission. Bilat legs with generalized erythremia and edema; chronic venous stasis changes with peau 'de orange skin appearance and dry peeling skin.  No open wounds or drainage noted to right leg, which is larger in size than the left. Reason for Consult: Consult requested for BLE.  Right gret toe and plantar inner foot with dry cracked callous; no open wound, drainage, or fluctuance.  Affected area of the callous is approx 3X3cm.  Left outer calf with partial thickness abrasion; 2X1X.1cm, 50% dark red, 50% red and moist.  Small amt bloody drainage, no odor Left plantar posterior foot with chronic full thickness wound which is healing, according to the patient.  1X1X.3cm,pale yellow wound bed, mod amt yellow drainage, no odor or fluctuance. Dressing procedure/placement/frequency: Foam dressing to promote healing to left leg abrasion. Aquacel to absorb drainage and provide antimicrobial benefits to left posterior foot.  Eucerin cream to bilat legs and right foot callous.  Pt can resume followup with his physician for further wound assessment after he is discharged. Please re-consult if further assistance is needed.  Thank-you,  Julien Girt MSN, [place], [place], [place], [place]

## 2018-02-18 LAB — BASIC METABOLIC PANEL
Anion gap: 10 (ref 5–15)
BUN: 28 mg/dL — AB (ref 6–20)
CALCIUM: 8.2 mg/dL — AB (ref 8.9–10.3)
CO2: 28 mmol/L (ref 22–32)
CREATININE: 0.76 mg/dL (ref 0.61–1.24)
Chloride: 99 mmol/L — ABNORMAL LOW (ref 101–111)
GFR calc Af Amer: >60 mL/min (ref >60)
GLUCOSE: 110 mg/dL — AB (ref 65–99)
Potassium: 4.2 mmol/L (ref 3.5–5.1)
Sodium: 137 mmol/L (ref 135–145)

## 2018-02-18 LAB — CBC
HCT: 37.5 % — ABNORMAL LOW (ref 39.0–52.0)
Hemoglobin: 11.4 g/dL — ABNORMAL LOW (ref 13.0–17.0)
MCH: 25.8 pg — ABNORMAL LOW (ref 26.0–34.0)
MCHC: 30.4 g/dL (ref 30.0–36.0)
MCV: 84.8 fL (ref 78.0–100.0)
Platelets: 175 10*3/uL (ref 150–400)
RBC: 4.42 MIL/uL (ref 4.22–5.81)
RDW: 14.9 % (ref 11.5–15.5)
WBC: 6.1 10*3/uL (ref 4.0–10.5)

## 2018-02-18 LAB — MAGNESIUM: MAGNESIUM: 1.9 mg/dL (ref 1.7–2.4)

## 2018-02-18 LAB — GLUCOSE, CAPILLARY
GLUCOSE-CAPILLARY: 97 mg/dL (ref 65–99)
GLUCOSE-CAPILLARY: 192 mg/dL — AB (ref 65–99)
Glucose-Capillary: 115 mg/dL — ABNORMAL HIGH (ref 65–99)
Glucose-Capillary: 157 mg/dL — ABNORMAL HIGH (ref 65–99)

## 2018-02-18 MED ORDER — FUROSEMIDE 10 MG/ML IJ SOLN
80.0000 mg | Freq: Two times a day (BID) | INTRAMUSCULAR | Status: DC
  Administered 2018-02-19 – 2018-02-22 (×7): 80 mg via INTRAVENOUS
  Filled 2018-02-18 (×8): qty 8

## 2018-02-18 MED ORDER — METOLAZONE 5 MG PO TABS
5.0000 mg | ORAL_TABLET | Freq: Once | ORAL | Status: AC
  Administered 2018-02-18: 5 mg via ORAL
  Filled 2018-02-18: qty 1

## 2018-02-18 MED ORDER — ALBUTEROL SULFATE (2.5 MG/3ML) 0.083% IN NEBU
INHALATION_SOLUTION | RESPIRATORY_TRACT | Status: AC
  Administered 2018-02-18: 2.5 mg
  Filled 2018-02-18: qty 3

## 2018-02-18 NOTE — Progress Notes (Signed)
PROGRESS NOTE    Jeffery Nelson  DEY:[id number] DOB: 15-Dec-1946 DOA: 02/17/2018 PCP: [name], MD    Brief Narrative:  72 year old male with a history of nonischemic cardiomyopathy, EF of 35-40%, chronic combined CHF, diabetes and hypertension, presented to the hospital with progressive shortness of breath.  He admits to being noncompliant with medications including his diuretics.  He was noted to be in volume overload on arrival and was admitted for IV diuresis.   Assessment & Plan:   Principal Problem:   Acute on chronic combined systolic and diastolic CHF (congestive heart failure) (HCC) Active Problems:   Morbid obesity (HCC)   Restless legs syndrome (RLS)   Non-ischemic cardiomyopathy (HCC)   Type II diabetes mellitus ([place])   AICD (automatic cardioverter/defibrillator) present   Venous stasis dermatitis of both lower extremities   CHF (congestive heart failure) (H[facility])   Cellulitis of right leg   1. Acute on chronic combined CHF.  Ejection fraction of 35-40%.  Decompensation related to noncompliance with antibiotics.  Urine output has been poor with intravenous Lasix.  Will increase dose to 80 mg twice daily and give 1 dose of metolazone today.  Depending on response, may need to consider Lasix infusion.  Continue on Coreg and benazepril. 2. Hypertension.  Blood pressure currently stable on current regimen of Coreg and benazepril.  Continue current treatments 3. Hyperlipidemia.  Continue on Crestor. 4. Diabetes. 5. Venous stasis.  Unlikely cellulitis.  Will hold off on antibiotics for now.  Keep elevated.  Blood sugar stable.  Continue on 70/30 insulin as well as sliding scale.   DVT prophylaxis: Lovenox Code Status: Full code Family Communication: Discussed with family member at the bedside Disposition Plan: Discharge home once improved   Consultants:   Cardiology  Wound care  Procedures:     Antimicrobials:      Subjective: Feels that shortness of  breath is improving.  Lower extremity edema also is doing better.  No chest pain.  Objective: Vitals:   02/18/18 0410 02/18/18 0439 02/18/18 0552 02/18/18 1319  BP: (!) 144/57   120/65  Pulse: 85   72  Resp: (!) 22   18  Temp: (!) 97.5 F (36.4 C)   (!) 97.5 F (36.4 C)  TempSrc: Oral   Oral  SpO2: 98% 97%  93%  Weight:   (!) 137.1 kg (302 lb 4 oz)   Height:        Intake/Output Summary (Last 24 hours) at 02/18/2018 1810 Last data filed at 02/18/2018 1500 Gross per 24 hour  Intake 720 ml  Output 1450 ml  Net -730 ml   Filed Weights   02/17/18 0353 02/18/18 0552  Weight: 135.2 kg (298 lb) (!) 137.1 kg (302 lb 4 oz)    Examination:  General exam: Appears calm and comfortable  Respiratory system: Diminished breath sounds at bases. Respiratory effort normal. Cardiovascular system: S1 & S2 heard, RRR. No JVD, murmurs, rubs, gallops or clicks.  1-2+ pedal edema with evidence of lymphedema bilaterally Gastrointestinal system: Abdomen is nondistended, soft and nontender. No organomegaly or masses felt. Normal bowel sounds heard. Central nervous system: Alert and oriented. No focal neurological deficits. Extremities: Symmetric 5 x 5 power. Skin: Venous stasis in lower extremities bilaterally Psychiatry: Judgement and insight appear normal. Mood & affect appropriate.     Data Reviewed: I have personally reviewed following labs and imaging studies  CBC: Recent Labs  Lab 02/17/18 0413 02/18/18 0548  WBC 8.1 6.1  NEUTROABS 5.8  --  HGB 10.8* 11.4*  HCT 34.3* 37.5*  MCV 82.9 84.8  PLT 169 818   Basic Metabolic Panel: Recent Labs  Lab 02/17/18 0413 02/18/18 0548  NA 136 137  K 4.0 4.2  CL 99* 99*  CO2 25 28  GLUCOSE 219* 110*  BUN 25* 28*  CREATININE 0.74 0.76  CALCIUM 8.2* 8.2*  MG  --  1.9   GFR: Estimated Creatinine Clearance: 108.3 mL/min (by C-G formula based on SCr of 0.76 mg/dL). Liver Function Tests: No results for input(s): AST, ALT, ALKPHOS, BILITOT,  PROT, ALBUMIN in the last 168 hours. No results for input(s): LIPASE, AMYLASE in the last 168 hours. No results for input(s): AMMONIA in the last 168 hours. Coagulation Profile: No results for input(s): INR, PROTIME in the last 168 hours. Cardiac Enzymes: Recent Labs  Lab 02/17/18 0413  TROPONINI <0.03   BNP (last 3 results) No results for input(s): PROBNP in the last 8760 hours. HbA1C: Recent Labs    02/17/18 0413  HGBA1C 8.2*   CBG: Recent Labs  Lab 02/17/18 1644 02/17/18 2050 02/18/18 0502 02/18/18 0712 02/18/18 1623  GLUCAP 72 127* 97 115* 157*   Lipid Profile: No results for input(s): CHOL, HDL, LDLCALC, TRIG, CHOLHDL, LDLDIRECT in the last 72 hours. Thyroid Function Tests: Recent Labs    02/17/18 0413  TSH 3.236   Anemia Panel: No results for input(s): VITAMINB12, FOLATE, FERRITIN, TIBC, IRON, RETICCTPCT in the last 72 hours. Sepsis Labs: No results for input(s): PROCALCITON, LATICACIDVEN in the last 168 hours.  Recent Results (from the past 240 hour(s))  MRSA PCR Screening     Status: None   Collection Time: 02/17/18 12:46 PM  Result Value Ref Range Status   MRSA by PCR NEGATIVE NEGATIVE Final    Comment:        The GeneXpert MRSA Assay (FDA approved for NASAL specimens only), is one component of a comprehensive MRSA colonization surveillance program. It is not intended to diagnose MRSA infection nor to guide or monitor treatment for MRSA infections. Performed at [facility], [address] [place], [place] [address]          Radiology Studies: Dg Chest [facility] 1 View  Result Date: 02/17/2018 CLINICAL DATA:  72 year old male with shortness of breath. EXAM: PORTABLE CHEST 1 VIEW COMPARISON:  Chest radiograph dated 06/22/2016 FINDINGS: There is cardiomegaly with vascular congestion. Shallow inspiration with bibasilar atelectasis. No focal consolidation, pleural effusion, or pneumothorax. Left pectoral pacemaker device. No acute osseous pathology.  IMPRESSION: Cardiomegaly with mild vascular congestion.  No focal consolidation. Electronically Signed   By: Anner Crete M.D.   On: 02/17/2018 04:29        Scheduled Meds: . aspirin EC  81 mg Oral Daily  . carvedilol  3.125 mg Oral BID  . enoxaparin (LOVENOX) injection  40 mg Subcutaneous Q24H  . FLUoxetine  20 mg Oral Daily  . furosemide  40 mg Intravenous BID  . hydrocerin   Topical Daily  . insulin aspart  0-20 Units Subcutaneous TID WC  . insulin aspart  0-5 Units Subcutaneous QHS  . insulin aspart protamine- aspart  35 Units Subcutaneous BID PC  . potassium chloride SA  30 mEq Oral BID  . rOPINIRole  4 mg Oral Daily  . rosuvastatin  20 mg Oral Daily  . sodium chloride flush  3 mL Intravenous Q12H   Continuous Infusions: . sodium chloride       LOS: 1 day    Time spent: 37mins  Kathie Dike, MD [facility] Pager [phone]  If 7PM-7AM, please contact night-coverage [email or web address] Password [facility] 02/18/2018, 6:10 PM

## 2018-02-19 DIAGNOSIS — I10 Essential (primary) hypertension: Secondary | ICD-10-CM

## 2018-02-19 DIAGNOSIS — I878 Other specified disorders of veins: Secondary | ICD-10-CM

## 2018-02-19 DIAGNOSIS — E785 Hyperlipidemia, unspecified: Secondary | ICD-10-CM

## 2018-02-19 LAB — CBC
HCT: 35.9 % — ABNORMAL LOW (ref 39.0–52.0)
Hemoglobin: 11.1 g/dL — ABNORMAL LOW (ref 13.0–17.0)
MCH: 25.9 pg — ABNORMAL LOW (ref 26.0–34.0)
MCHC: 30.9 g/dL (ref 30.0–36.0)
MCV: 83.7 fL (ref 78.0–100.0)
Platelets: 191 10*3/uL (ref 150–400)
RBC: 4.29 MIL/uL (ref 4.22–5.81)
RDW: 14.8 % (ref 11.5–15.5)
WBC: 7 10*3/uL (ref 4.0–10.5)

## 2018-02-19 LAB — GLUCOSE, CAPILLARY
GLUCOSE-CAPILLARY: 158 mg/dL — AB (ref 65–99)
GLUCOSE-CAPILLARY: 157 mg/dL — AB (ref 65–99)
GLUCOSE-CAPILLARY: 192 mg/dL — AB (ref 65–99)
GLUCOSE-CAPILLARY: 183 mg/dL — AB (ref 65–99)
Glucose-Capillary: 153 mg/dL — ABNORMAL HIGH (ref 65–99)

## 2018-02-19 LAB — BASIC METABOLIC PANEL
Anion gap: 11 (ref 5–15)
BUN: 27 mg/dL — AB (ref 6–20)
CALCIUM: 8.6 mg/dL — AB (ref 8.9–10.3)
CO2: 26 mmol/L (ref 22–32)
Chloride: 100 mmol/L — ABNORMAL LOW (ref 101–111)
Creatinine, Ser: 0.74 mg/dL (ref 0.61–1.24)
GFR calc Af Amer: >60 mL/min (ref >60)
Glucose, Bld: 154 mg/dL — ABNORMAL HIGH (ref 65–99)
Potassium: 4.4 mmol/L (ref 3.5–5.1)
Sodium: 137 mmol/L (ref 135–145)

## 2018-02-19 MED ORDER — METOLAZONE 5 MG PO TABS
5.0000 mg | ORAL_TABLET | Freq: Two times a day (BID) | ORAL | Status: DC
  Administered 2018-02-19 – 2018-02-20 (×2): 5 mg via ORAL
  Filled 2018-02-19 (×2): qty 1

## 2018-02-19 NOTE — Progress Notes (Signed)
PROGRESS NOTE    Jeffery Nelson  BOF:[id number] DOB: 1946/08/30 DOA: 02/17/2018 PCP: [name], MD    Brief Narrative:  72 year old male with a history of nonischemic cardiomyopathy, EF of 35-40%, chronic combined CHF, diabetes and hypertension, presented to the hospital with progressive shortness of breath.  He admits to being noncompliant with medications including his diuretics.  He was noted to be in volume overload on arrival and was admitted for IV diuresis.   Assessment & Plan:   Principal Problem:   Acute on chronic combined systolic and diastolic CHF (congestive heart failure) (HCC) Active Problems:   Morbid obesity (HCC)   Restless legs syndrome (RLS)   Non-ischemic cardiomyopathy (HCC)   Type II diabetes mellitus ([place])   AICD (automatic cardioverter/defibrillator) present   Venous stasis dermatitis of both lower extremities   CHF (congestive heart failure) (HCC)   Cellulitis of right leg   1. Acute on chronic combined CHF.  Ejection fraction of 35-40%.  Decompensation related to noncompliance with antibiotics.  Currently on intravenous Lasix with metolazone.  Overall urine output is improving.  Renal function is stable.  Still is massively volume overloaded.  Continue on Coreg and benazepril. 2. Hypertension.  Blood pressure stable on Coreg and benazepril.  Continue current treatments 3. Hyperlipidemia.  Continue on Crestor. 4. Diabetes.  On 70/30 insulin.  Blood sugars are stable. 5. Venous stasis.  Unlikely cellulitis.  Will hold off on antibiotics for now.  Keep lower extremities elevated   DVT prophylaxis: Lovenox Code Status: Full code Family Communication: Discussed with family member at the bedside Disposition Plan: Discharge home once improved   Consultants:   Cardiology  Wound care  Procedures:     Antimicrobials:      Subjective: Urine output is improving.  Feels shortness of breath is better, although not back to baseline.  Still has  substantial edema.  Objective: Vitals:   02/18/18 2104 02/18/18 2105 02/19/18 0536 02/19/18 1447  BP:   (!) 143/86 (!) 152/78  Pulse:   93 79  Resp:    18  Temp:   98.1 F (36.7 C) 97.9 F (36.6 C)  TempSrc:   Oral Oral  SpO2: 94% 96% 96% 100%  Weight:   (!) 137.1 kg (302 lb 4 oz)   Height:        Intake/Output Summary (Last 24 hours) at 02/19/2018 1645 Last data filed at 02/19/2018 1300 Gross per 24 hour  Intake 720 ml  Output 1125 ml  Net -405 ml   Filed Weights   02/17/18 0353 02/18/18 0552 02/19/18 0536  Weight: 135.2 kg (298 lb) (!) 137.1 kg (302 lb 4 oz) (!) 137.1 kg (302 lb 4 oz)    Examination:  General exam: Alert, awake, oriented x 3 Respiratory system: crackles at bases. Respiratory effort normal. Cardiovascular system:RRR. No murmurs, rubs, gallops. Gastrointestinal system: Abdomen is nondistended, soft and nontender. No organomegaly or masses felt. Normal bowel sounds heard. Central nervous system: Alert and oriented. No focal neurological deficits. Extremities: 1-2+ edema with widespread anasarca Skin: Venous stasis changes in lower extremities bilaterally Psychiatry: Judgement and insight appear normal. Mood & affect appropriate.   Data Reviewed: I have personally reviewed following labs and imaging studies  CBC: Recent Labs  Lab 02/17/18 0413 02/18/18 0548 02/19/18 0559  WBC 8.1 6.1 7.0  NEUTROABS 5.8  --   --   HGB 10.8* 11.4* 11.1*  HCT 34.3* 37.5* 35.9*  MCV 82.9 84.8 83.7  PLT 169 175 191  Basic Metabolic Panel: Recent Labs  Lab 02/17/18 0413 02/18/18 0548 02/19/18 0559  NA 136 137 137  K 4.0 4.2 4.4  CL 99* 99* 100*  CO2 25 28 26   GLUCOSE 219* 110* 154*  BUN 25* 28* 27*  CREATININE 0.74 0.76 0.74  CALCIUM 8.2* 8.2* 8.6*  MG  --  1.9  --    GFR: Estimated Creatinine Clearance: 108.3 mL/min (by C-G formula based on SCr of 0.74 mg/dL). Liver Function Tests: No results for input(s): AST, ALT, ALKPHOS, BILITOT, PROT, ALBUMIN in  the last 168 hours. No results for input(s): LIPASE, AMYLASE in the last 168 hours. No results for input(s): AMMONIA in the last 168 hours. Coagulation Profile: No results for input(s): INR, PROTIME in the last 168 hours. Cardiac Enzymes: Recent Labs  Lab 02/17/18 0413  TROPONINI <0.03   BNP (last 3 results) No results for input(s): PROBNP in the last 8760 hours. HbA1C: Recent Labs    02/17/18 0413  HGBA1C 8.2*   CBG: Recent Labs  Lab 02/18/18 1623 02/18/18 2103 02/19/18 0735 02/19/18 1130 02/19/18 1610  GLUCAP 157* 192* 157* 192* 153*   Lipid Profile: No results for input(s): CHOL, HDL, LDLCALC, TRIG, CHOLHDL, LDLDIRECT in the last 72 hours. Thyroid Function Tests: Recent Labs    02/17/18 0413  TSH 3.236   Anemia Panel: No results for input(s): VITAMINB12, FOLATE, FERRITIN, TIBC, IRON, RETICCTPCT in the last 72 hours. Sepsis Labs: No results for input(s): PROCALCITON, LATICACIDVEN in the last 168 hours.  Recent Results (from the past 240 hour(s))  MRSA PCR Screening     Status: None   Collection Time: 02/17/18 12:46 PM  Result Value Ref Range Status   MRSA by PCR NEGATIVE NEGATIVE Final    Comment:        The GeneXpert MRSA Assay (FDA approved for NASAL specimens only), is one component of a comprehensive MRSA colonization surveillance program. It is not intended to diagnose MRSA infection nor to guide or monitor treatment for MRSA infections. Performed at [facility], [address] [place], [place] [address]          Radiology Studies: No results found.      Scheduled Meds: . aspirin EC  81 mg Oral Daily  . carvedilol  3.125 mg Oral BID  . enoxaparin (LOVENOX) injection  40 mg Subcutaneous Q24H  . FLUoxetine  20 mg Oral Daily  . furosemide  80 mg Intravenous BID  . hydrocerin   Topical Daily  . insulin aspart  0-20 Units Subcutaneous TID WC  . insulin aspart  0-5 Units Subcutaneous QHS  . insulin aspart protamine- aspart  35 Units  Subcutaneous BID PC  . metolazone  5 mg Oral BID  . potassium chloride SA  30 mEq Oral BID  . rOPINIRole  4 mg Oral Daily  . rosuvastatin  20 mg Oral Daily  . sodium chloride flush  3 mL Intravenous Q12H   Continuous Infusions: . sodium chloride       LOS: 2 days    Time spent: 35mins    Kathie Dike, MD [facility] Pager [phone]  If 7PM-7AM, please contact night-coverage [email or web address] Password [facility] 02/19/2018, 4:45 PM

## 2018-02-20 LAB — GLUCOSE, CAPILLARY
GLUCOSE-CAPILLARY: 151 mg/dL — AB (ref 65–99)
GLUCOSE-CAPILLARY: 114 mg/dL — AB (ref 65–99)
Glucose-Capillary: 162 mg/dL — ABNORMAL HIGH (ref 65–99)
Glucose-Capillary: 143 mg/dL — ABNORMAL HIGH (ref 65–99)
Glucose-Capillary: 122 mg/dL — ABNORMAL HIGH (ref 65–99)

## 2018-02-20 LAB — BASIC METABOLIC PANEL
Anion gap: 11 (ref 5–15)
BUN: 23 mg/dL — AB (ref 6–20)
CHLORIDE: 92 mmol/L — AB (ref 101–111)
CO2: 30 mmol/L (ref 22–32)
CREATININE: 0.69 mg/dL (ref 0.61–1.24)
Calcium: 8.7 mg/dL — ABNORMAL LOW (ref 8.9–10.3)
GFR calc Af Amer: >60 mL/min (ref >60)
GFR calc non Af Amer: >60 mL/min (ref >60)
GLUCOSE: 162 mg/dL — AB (ref 65–99)
Potassium: 4.2 mmol/L (ref 3.5–5.1)
Sodium: 133 mmol/L — ABNORMAL LOW (ref 135–145)

## 2018-02-20 MED ORDER — BENAZEPRIL HCL 10 MG PO TABS
20.0000 mg | ORAL_TABLET | Freq: Every day | ORAL | Status: DC
  Administered 2018-02-20 – 2018-02-22 (×3): 20 mg via ORAL
  Filled 2018-02-20 (×3): qty 2

## 2018-02-20 MED ORDER — METOLAZONE 5 MG PO TABS
5.0000 mg | ORAL_TABLET | Freq: Every day | ORAL | Status: DC
  Filled 2018-02-20: qty 1

## 2018-02-20 NOTE — Progress Notes (Signed)
PROGRESS NOTE    Jeffery Nelson  ZOX:[id number] DOB: 1946/09/04 DOA: 02/17/2018 PCP: [name], MD    Brief Narrative:  72 year old male with a history of nonischemic cardiomyopathy, EF of 35-40%, chronic combined CHF, diabetes and hypertension, presented to the hospital with progressive shortness of breath.  He admits to being noncompliant with medications including his diuretics.  He was noted to be in volume overload on arrival and was admitted for IV diuresis.   Assessment & Plan:   Principal Problem:   Acute on chronic combined systolic and diastolic CHF (congestive heart failure) (HCC) Active Problems:   Morbid obesity (HCC)   Restless legs syndrome (RLS)   Non-ischemic cardiomyopathy (HCC)   Type II diabetes mellitus ([place])   AICD (automatic cardioverter/defibrillator) present   Venous stasis dermatitis of both lower extremities   CHF (congestive heart failure) (HCC)   Cellulitis of right leg   1. Acute on chronic combined CHF.  Ejection fraction of 35-40%.  Decompensation related to noncompliance with antibiotics.  Currently on intravenous Lasix with metolazone.  Yesterday urine output recorded at 13.7 L.  Unclear if this is accurate.  Clinically he is improving renal function is stable.  Continue intravenous diuresis.  Continue on Coreg and benazepril.  Cardiology following 2. Hypertension.  Blood pressure currently stable on Coreg and benazepril.  Continue current treatments 3. Hyperlipidemia.  Continue on Crestor. 4. Diabetes.  On 70/30 insulin and sliding scale.  Blood sugars are stable. 5. Venous stasis.  Unlikely cellulitis.  Will hold off on antibiotics for now.  Keep lower extremities elevated.  Appears to be improving   DVT prophylaxis: Lovenox Code Status: Full code Family Communication: Discussed with family member at the bedside Disposition Plan: Discharge home once improved   Consultants:   Cardiology  Wound care  Procedures:      Antimicrobials:      Subjective: Shortness of breath improving.  No chest pain.  Feels that edema is better.  Objective: Vitals:   02/19/18 1447 02/19/18 1802 02/19/18 2126 02/20/18 0547  BP: (!) 152/78  (!) 141/62 124/67  Pulse: 79  85 (!) 107  Resp: 18  18 18   Temp: 97.9 F (36.6 C)  (!) 97.5 F (36.4 C) 98 F (36.7 C)  TempSrc: Oral  Oral Oral  SpO2: 100%  97% 98%  Weight:  133 kg (293 lb 3.4 oz)    Height:        Intake/Output Summary (Last 24 hours) at 02/20/2018 1128 Last data filed at 02/20/2018 1100 Gross per 24 hour  Intake 720 ml  Output 17050 ml  Net -16330 ml   Filed Weights   02/18/18 0552 02/19/18 0536 02/19/18 1802  Weight: (!) 137.1 kg (302 lb 4 oz) (!) 137.1 kg (302 lb 4 oz) 133 kg (293 lb 3.4 oz)    Examination:  General exam: Alert, awake, oriented x 3 Respiratory system: Crackles at bases bilaterally. Respiratory effort normal. Cardiovascular system:RRR. No murmurs, rubs, gallops. Gastrointestinal system: Abdomen is nondistended, soft and nontender. No organomegaly or masses felt. Normal bowel sounds heard. Central nervous system: Alert and oriented. No focal neurological deficits. Extremities: Lower extremity edema improving. Skin: Venous stasis bilaterally Psychiatry: Judgement and insight appear normal. Mood & affect appropriate.   Data Reviewed: I have personally reviewed following labs and imaging studies  CBC: Recent Labs  Lab 02/17/18 0413 02/18/18 0548 02/19/18 0559  WBC 8.1 6.1 7.0  NEUTROABS 5.8  --   --   HGB 10.8* 11.4*  11.1*  HCT 34.3* 37.5* 35.9*  MCV 82.9 84.8 83.7  PLT 169 175 528   Basic Metabolic Panel: Recent Labs  Lab 02/17/18 0413 02/18/18 0548 02/19/18 0559 02/20/18 0614  NA 136 137 137 133*  K 4.0 4.2 4.4 4.2  CL 99* 99* 100* 92*  CO2 25 28 26 30   GLUCOSE 219* 110* 154* 162*  BUN 25* 28* 27* 23*  CREATININE 0.74 0.76 0.74 0.69  CALCIUM 8.2* 8.2* 8.6* 8.7*  MG  --  1.9  --   --     GFR: Estimated Creatinine Clearance: 106.4 mL/min (by C-G formula based on SCr of 0.69 mg/dL). Liver Function Tests: No results for input(s): AST, ALT, ALKPHOS, BILITOT, PROT, ALBUMIN in the last 168 hours. No results for input(s): LIPASE, AMYLASE in the last 168 hours. No results for input(s): AMMONIA in the last 168 hours. Coagulation Profile: No results for input(s): INR, PROTIME in the last 168 hours. Cardiac Enzymes: Recent Labs  Lab 02/17/18 0413  TROPONINI <0.03   BNP (last 3 results) No results for input(s): PROBNP in the last 8760 hours. HbA1C: No results for input(s): HGBA1C in the last 72 hours. CBG: Recent Labs  Lab 02/19/18 1130 02/19/18 1610 02/19/18 2034 02/20/18 0253 02/20/18 0737  GLUCAP 192* 153* 183* 151* 162*   Lipid Profile: No results for input(s): CHOL, HDL, LDLCALC, TRIG, CHOLHDL, LDLDIRECT in the last 72 hours. Thyroid Function Tests: No results for input(s): TSH, T4TOTAL, FREET4, T3FREE, THYROIDAB in the last 72 hours. Anemia Panel: No results for input(s): VITAMINB12, FOLATE, FERRITIN, TIBC, IRON, RETICCTPCT in the last 72 hours. Sepsis Labs: No results for input(s): PROCALCITON, LATICACIDVEN in the last 168 hours.  Recent Results (from the past 240 hour(s))  MRSA PCR Screening     Status: None   Collection Time: 02/17/18 12:46 PM  Result Value Ref Range Status   MRSA by PCR NEGATIVE NEGATIVE Final    Comment:        The GeneXpert MRSA Assay (FDA approved for NASAL specimens only), is one component of a comprehensive MRSA colonization surveillance program. It is not intended to diagnose MRSA infection nor to guide or monitor treatment for MRSA infections. Performed at [facility], [address] [place], [place] [address]          Radiology Studies: No results found.      Scheduled Meds: . aspirin EC  81 mg Oral Daily  . benazepril  20 mg Oral Daily  . carvedilol  3.125 mg Oral BID  . enoxaparin (LOVENOX) injection   40 mg Subcutaneous Q24H  . FLUoxetine  20 mg Oral Daily  . furosemide  80 mg Intravenous BID  . hydrocerin   Topical Daily  . insulin aspart  0-20 Units Subcutaneous TID WC  . insulin aspart  0-5 Units Subcutaneous QHS  . insulin aspart protamine- aspart  35 Units Subcutaneous BID PC  . [START ON 02/21/2018] metolazone  5 mg Oral Daily  . potassium chloride SA  30 mEq Oral BID  . rOPINIRole  4 mg Oral Daily  . rosuvastatin  20 mg Oral Daily  . sodium chloride flush  3 mL Intravenous Q12H   Continuous Infusions: . sodium chloride       LOS: 3 days    Time spent: 16mins    Kathie Dike, MD [facility] Pager [phone]  If 7PM-7AM, please contact night-coverage [email or web address] Password [facility] 02/20/2018, 11:28 AM

## 2018-02-20 NOTE — Progress Notes (Signed)
Progress Note  Patient Name: Jeffery Nelson Date of Encounter: 02/20/2018  Primary Cardiologist: Pixie Casino, MD   Subjective   SOB is improving.   Inpatient Medications    Scheduled Meds: . aspirin EC  81 mg Oral Daily  . carvedilol  3.125 mg Oral BID  . enoxaparin (LOVENOX) injection  40 mg Subcutaneous Q24H  . FLUoxetine  20 mg Oral Daily  . furosemide  80 mg Intravenous BID  . hydrocerin   Topical Daily  . insulin aspart  0-20 Units Subcutaneous TID WC  . insulin aspart  0-5 Units Subcutaneous QHS  . insulin aspart protamine- aspart  35 Units Subcutaneous BID PC  . metolazone  5 mg Oral BID  . potassium chloride SA  30 mEq Oral BID  . rOPINIRole  4 mg Oral Daily  . rosuvastatin  20 mg Oral Daily  . sodium chloride flush  3 mL Intravenous Q12H   Continuous Infusions: . sodium chloride     PRN Meds: sodium chloride, acetaminophen **OR** acetaminophen, colchicine, fluocinonide cream, ipratropium-albuterol, LORazepam, ondansetron **OR** ondansetron (ZOFRAN) IV, sodium chloride flush   Vital Signs    Vitals:   02/19/18 1447 02/19/18 1802 02/19/18 2126 02/20/18 0547  BP: (!) 152/78  (!) 141/62 124/67  Pulse: 79  85 (!) 107  Resp: 18  18 18   Temp: 97.9 F (36.6 C)  (!) 97.5 F (36.4 C) 98 F (36.7 C)  TempSrc: Oral  Oral Oral  SpO2: 100%  97% 98%  Weight:  293 lb 3.4 oz (133 kg)    Height:        Intake/Output Summary (Last 24 hours) at 02/20/2018 0932 Last data filed at 02/20/2018 0904 Gross per 24 hour  Intake 480 ml  Output 15300 ml  Net -14820 ml   Filed Weights   02/18/18 0552 02/19/18 0536 02/19/18 1802  Weight: (!) 302 lb 4 oz (137.1 kg) (!) 302 lb 4 oz (137.1 kg) 293 lb 3.4 oz (133 kg)    Telemetry    SR - Personally Reviewed  ECG    n/a  Physical Exam   GEN: No acute distress.   Neck: elevated jvd Cardiac: RRR, .  Respiratory: crackles bilaterla bases GI: Soft, nontender, non-distended  MS: No edema; No deformity. Neuro:   Nonfocal  Psych: Normal affect   Labs    Chemistry Recent Labs  Lab 02/18/18 0548 02/19/18 0559 02/20/18 0614  NA 137 137 133*  K 4.2 4.4 4.2  CL 99* 100* 92*  CO2 28 26 30   GLUCOSE 110* 154* 162*  BUN 28* 27* 23*  CREATININE 0.76 0.74 0.69  CALCIUM 8.2* 8.6* 8.7*  GFRNONAA >60 >60 >60  GFRAA >60 >60 >60  ANIONGAP 10 11 11      Hematology Recent Labs  Lab 02/17/18 0413 02/18/18 0548 02/19/18 0559  WBC 8.1 6.1 7.0  RBC 4.14* 4.42 4.29  HGB 10.8* 11.4* 11.1*  HCT 34.3* 37.5* 35.9*  MCV 82.9 84.8 83.7  MCH 26.1 25.8* 25.9*  MCHC 31.5 30.4 30.9  RDW 15.0 14.9 14.8  PLT 169 175 191    Cardiac Enzymes Recent Labs  Lab 02/17/18 0413  TROPONINI <0.03   No results for input(s): TROPIPOC in the last 168 hours.   BNP Recent Labs  Lab 02/17/18 0413  BNP 439.0*     DDimer No results for input(s): DDIMER in the last 168 hours.   Radiology    No results found.  Cardiac Studies  Patient Profile     Jeffery Nelson is a 72 y.o. male with past medical history of CAD (nonosbtructive CAD by cath in 01/2016), chronic combined systolic and diastolic CHF (EF previously 20%, improved to 35-40% by echo in 05/2016), nonischemic cardiomyopathy (s/p [employer] ICD placement in 06/2016), IDDM, HTN, HLD, and morbid obesity who is being seen today for the evaluation of CHF at the request of Dr. Wynetta Emery.     Assessment & Plan    1. Acute on chronic combined systolic/diastolic HF - 07/8415 echo LVEF 60-63%, grade I diastolic dysfunction.  - appears recent exacerbation due to medication noncompliance with his diuretic.  - I/Os show negative 13 liters just yesterday, I will check with nursing to see if this is a typo - currently on lasix 80mg  IV bid and metolazone 5mg  bid, renal function is stable.  - other medical therapy with coreg 3.125mg  bid. Restart ACE-I (previous issues with affording meds, will not change to entresto)  - continue IV diuresis today. Will treat with  lasix 80mg  bid and metolazone 5mg  just once daily.   For questions or updates, please contact [place] Please consult [email or web address] for contact info under Cardiology/STEMI.      Merrily Pew, MD  02/20/2018, 9:32 AM

## 2018-02-21 ENCOUNTER — Telehealth: Payer: Self-pay | Admitting: *Deleted

## 2018-02-21 LAB — BASIC METABOLIC PANEL
ANION GAP: 12 (ref 5–15)
BUN: 24 mg/dL — ABNORMAL HIGH (ref 6–20)
CHLORIDE: 88 mmol/L — AB (ref 101–111)
CO2: 32 mmol/L (ref 22–32)
Calcium: 8.5 mg/dL — ABNORMAL LOW (ref 8.9–10.3)
Creatinine, Ser: 0.84 mg/dL (ref 0.61–1.24)
GFR calc non Af Amer: >60 mL/min (ref >60)
Glucose, Bld: 83 mg/dL (ref 65–99)
POTASSIUM: 3.7 mmol/L (ref 3.5–5.1)
Sodium: 132 mmol/L — ABNORMAL LOW (ref 135–145)

## 2018-02-21 LAB — GLUCOSE, CAPILLARY
GLUCOSE-CAPILLARY: 93 mg/dL (ref 65–99)
Glucose-Capillary: 75 mg/dL (ref 65–99)
Glucose-Capillary: 97 mg/dL (ref 65–99)
Glucose-Capillary: 120 mg/dL — ABNORMAL HIGH (ref 65–99)
Glucose-Capillary: 100 mg/dL — ABNORMAL HIGH (ref 65–99)

## 2018-02-21 MED ORDER — LIVING BETTER WITH HEART FAILURE BOOK
Freq: Once | Status: AC
  Administered 2018-02-21: 11:00:00

## 2018-02-21 NOTE — Telephone Encounter (Signed)
-----   Message from Charlie Pitter, [place] sent at 02/21/2018  8:51 AM EST ----- Regarding: Appt scheduled 3/12 - will need TOC phone call upon DC Likely going home today or tomorrow from [facility]. Thanks!

## 2018-02-21 NOTE — Progress Notes (Signed)
Ambulated patient approx. 100 feet. Tolerated fair. Pt O2 saturations upon ambulating and sitting down were 87-88%. 2 Liters [place] applied. MD made aware. Will continue to monitor.

## 2018-02-21 NOTE — Care Management Note (Signed)
Case Management Note  Patient Details  Name: Jeffery Nelson MRN: [id number] Date of Birth: 10-Feb-1946  Subjective/Objective:     Adm with CHF, venous stasis. From home alone, has cane and RW. Has PCP. Reports noncompliance with medications/lifestyle but ready to change ways and be compliant. Interested in home health RN for follow up and teaching.   Action/Plan: Has had [facility] previously and would like again. Tim of [facility] notified and will obtain orders via Epic.  Expected Discharge Date:    02/21/2018              Expected Discharge Plan:  [place]  In-House Referral:     Discharge planning Services  CM Consult  Post Acute Care Choice:  Home Health Choice offered to:  Patient  DME Arranged:    DME Agency:     HH Arranged:  RN, Disease Management [place] Agency:  [facility] (formerly [facility]  Status of Service:  Completed, signed off  If discussed at [employer], dates discussed:    Additional Comments:  Arisbeth Purrington, Chauncey [place], RN 02/21/2018, 10:54 AM

## 2018-02-21 NOTE — Progress Notes (Signed)
PROGRESS NOTE    Jeffery Nelson  GLO:[id number] DOB: February 22, 1946 DOA: 02/17/2018 PCP: [name], MD    Brief Narrative:  72 year old male with a history of nonischemic cardiomyopathy, EF of 35-40%, chronic combined CHF, diabetes and hypertension, presented to the hospital with progressive shortness of breath.  He admits to being noncompliant with medications including his diuretics.  He was noted to be in volume overload on arrival and was admitted for IV diuresis.   Assessment & Plan:   Principal Problem:   Acute on chronic combined systolic and diastolic CHF (congestive heart failure) (HCC) Active Problems:   Morbid obesity (HCC)   Restless legs syndrome (RLS)   Non-ischemic cardiomyopathy (HCC)   Type II diabetes mellitus ([place])   AICD (automatic cardioverter/defibrillator) present   Venous stasis dermatitis of both lower extremities   CHF (congestive heart failure) (HCC)   Cellulitis of right leg   1. Acute on chronic combined CHF.  Ejection fraction of 35-40%.  Decompensation related to noncompliance with diuretics.  Currently on intravenous Lasix with metolazone.  His weight is down 26 pounds since admission.  Clinically he is improving renal function is stable.  Continue intravenous diuresis.  Continue on Coreg and benazepril.  Cardiology following.  He is still hypoxic on ambulation.  We will continue treatment for another 24 hours.  Anticipate discharge tomorrow. 2. Hypertension.  Blood pressure currently stable on Coreg and benazepril.  Continue current treatments 3. Hyperlipidemia.  Continue on Crestor. 4. Diabetes.  On 70/30 insulin and sliding scale.  Blood sugars are stable. 5. Venous stasis.  Unlikely cellulitis.  Will hold off on antibiotics for now.  Keep lower extremities elevated.  Appears to be improving   DVT prophylaxis: Lovenox Code Status: Full code Family Communication: No family present Disposition Plan: Discharge home once improved, likely in  a.m.   Consultants:   Cardiology  Wound care  Procedures:     Antimicrobials:      Subjective: Still short of breath on exertion.  Desaturated to 87% on room air while ambulating  Objective: Vitals:   02/21/18 0530 02/21/18 1300 02/21/18 1523 02/21/18 1602  BP: 110/69 112/68 111/61 108/61  Pulse: 74 75 79 80  Resp: 18 17    Temp: 98.2 F (36.8 C) 98.4 F (36.9 C)    TempSrc: Oral Oral    SpO2: 94% 96% 95% (!) 88%  Weight: 125.5 kg (276 lb 10.8 oz)     Height:        Intake/Output Summary (Last 24 hours) at 02/21/2018 1755 Last data filed at 02/21/2018 1518 Gross per 24 hour  Intake 723 ml  Output 3450 ml  Net -2727 ml   Filed Weights   02/19/18 1802 02/20/18 1854 02/21/18 0530  Weight: 133 kg (293 lb 3.4 oz) 124.4 kg (274 lb 4.8 oz) 125.5 kg (276 lb 10.8 oz)    Examination:  General exam: Alert, awake, oriented x 3 Respiratory system: Crackles at bases. Respiratory effort normal. Cardiovascular system:RRR. No murmurs, rubs, gallops. Gastrointestinal system: Abdomen is nondistended, soft and nontender. No organomegaly or masses felt. Normal bowel sounds heard. Central nervous system: Alert and oriented. No focal neurological deficits. Extremities: Lower extremity edema improving.  Venous stasis changes present Skin: No rashes, lesions or ulcers Psychiatry: Judgement and insight appear normal. Mood & affect appropriate.   Data Reviewed: I have personally reviewed following labs and imaging studies  CBC: Recent Labs  Lab 02/17/18 0413 02/18/18 0548 02/19/18 0559  WBC 8.1 6.1 7.0  NEUTROABS 5.8  --   --   HGB 10.8* 11.4* 11.1*  HCT 34.3* 37.5* 35.9*  MCV 82.9 84.8 83.7  PLT 169 175 563   Basic Metabolic Panel: Recent Labs  Lab 02/17/18 0413 02/18/18 0548 02/19/18 0559 02/20/18 0614 02/21/18 0605  NA 136 137 137 133* 132*  K 4.0 4.2 4.4 4.2 3.7  CL 99* 99* 100* 92* 88*  CO2 25 28 26 30  32  GLUCOSE 219* 110* 154* 162* 83  BUN 25* 28* 27*  23* 24*  CREATININE 0.74 0.76 0.74 0.69 0.84  CALCIUM 8.2* 8.2* 8.6* 8.7* 8.5*  MG  --  1.9  --   --   --    GFR: Estimated Creatinine Clearance: 97.9 mL/min (by C-G formula based on SCr of 0.84 mg/dL). Liver Function Tests: No results for input(s): AST, ALT, ALKPHOS, BILITOT, PROT, ALBUMIN in the last 168 hours. No results for input(s): LIPASE, AMYLASE in the last 168 hours. No results for input(s): AMMONIA in the last 168 hours. Coagulation Profile: No results for input(s): INR, PROTIME in the last 168 hours. Cardiac Enzymes: Recent Labs  Lab 02/17/18 0413  TROPONINI <0.03   BNP (last 3 results) No results for input(s): PROBNP in the last 8760 hours. HbA1C: No results for input(s): HGBA1C in the last 72 hours. CBG: Recent Labs  Lab 02/20/18 2059 02/21/18 0419 02/21/18 0739 02/21/18 1141 02/21/18 1715  GLUCAP 122* 75 97 120* 93   Lipid Profile: No results for input(s): CHOL, HDL, LDLCALC, TRIG, CHOLHDL, LDLDIRECT in the last 72 hours. Thyroid Function Tests: No results for input(s): TSH, T4TOTAL, FREET4, T3FREE, THYROIDAB in the last 72 hours. Anemia Panel: No results for input(s): VITAMINB12, FOLATE, FERRITIN, TIBC, IRON, RETICCTPCT in the last 72 hours. Sepsis Labs: No results for input(s): PROCALCITON, LATICACIDVEN in the last 168 hours.  Recent Results (from the past 240 hour(s))  MRSA PCR Screening     Status: None   Collection Time: 02/17/18 12:46 PM  Result Value Ref Range Status   MRSA by PCR NEGATIVE NEGATIVE Final    Comment:        The GeneXpert MRSA Assay (FDA approved for NASAL specimens only), is one component of a comprehensive MRSA colonization surveillance program. It is not intended to diagnose MRSA infection nor to guide or monitor treatment for MRSA infections. Performed at [facility], [address], [place], [place] [address]          Radiology Studies: No results found.      Scheduled Meds: . aspirin EC  81 mg Oral  Daily  . benazepril  20 mg Oral Daily  . carvedilol  3.125 mg Oral BID  . enoxaparin (LOVENOX) injection  40 mg Subcutaneous Q24H  . FLUoxetine  20 mg Oral Daily  . furosemide  80 mg Intravenous BID  . hydrocerin   Topical Daily  . insulin aspart  0-20 Units Subcutaneous TID WC  . insulin aspart  0-5 Units Subcutaneous QHS  . insulin aspart protamine- aspart  35 Units Subcutaneous BID PC  . potassium chloride SA  30 mEq Oral BID  . rOPINIRole  4 mg Oral Daily  . rosuvastatin  20 mg Oral Daily  . sodium chloride flush  3 mL Intravenous Q12H   Continuous Infusions: . sodium chloride       LOS: 4 days    Time spent: 63mins    Kathie Dike, MD [facility] Pager [phone]  If 7PM-7AM, please contact night-coverage [email or web address] Password [facility] 02/21/2018,  5:55 PM

## 2018-02-21 NOTE — Care Management Important Message (Signed)
Important Message  Patient Details  Name: LUAY BALDING MRN: [id number] Date of Birth: 01/21/1946   Medicare Important Message Given:  Yes    Yurem Viner, Chauncey Reading, RN 02/21/2018, 11:00 AM

## 2018-02-21 NOTE — Progress Notes (Addendum)
Progress Note  Patient Name: Jeffery Nelson Date of Encounter: 02/21/2018  Primary Cardiologist: Dr. Debara Pickett  Subjective   Feeling much better, much closer to baseline. Always has some edema on exam. Feeling more motivated about a better track with taking care of self and taking meds as prescribed.  Inpatient Medications    Scheduled Meds: . aspirin EC  81 mg Oral Daily  . benazepril  20 mg Oral Daily  . carvedilol  3.125 mg Oral BID  . enoxaparin (LOVENOX) injection  40 mg Subcutaneous Q24H  . FLUoxetine  20 mg Oral Daily  . furosemide  80 mg Intravenous BID  . hydrocerin   Topical Daily  . insulin aspart  0-20 Units Subcutaneous TID WC  . insulin aspart  0-5 Units Subcutaneous QHS  . insulin aspart protamine- aspart  35 Units Subcutaneous BID PC  . potassium chloride SA  30 mEq Oral BID  . rOPINIRole  4 mg Oral Daily  . rosuvastatin  20 mg Oral Daily  . sodium chloride flush  3 mL Intravenous Q12H   Continuous Infusions: . sodium chloride     PRN Meds: sodium chloride, acetaminophen **OR** acetaminophen, colchicine, fluocinonide cream, ipratropium-albuterol, LORazepam, ondansetron **OR** ondansetron (ZOFRAN) IV, sodium chloride flush   Vital Signs    Vitals:   02/20/18 1400 02/20/18 1854 02/20/18 2107 02/21/18 0530  BP: 121/65  96/63 110/69  Pulse: (!) 105  75 74  Resp: 17  18 18   Temp: 98.3 F (36.8 C)  97.7 F (36.5 C) 98.2 F (36.8 C)  TempSrc: Oral  Oral Oral  SpO2: 97%  97% 94%  Weight:  274 lb 4.8 oz (124.4 kg)  276 lb 10.8 oz (125.5 kg)  Height:        Intake/Output Summary (Last 24 hours) at 02/21/2018 0852 Last data filed at 02/21/2018 0630 Gross per 24 hour  Intake 963 ml  Output 5750 ml  Net -4787 ml   Filed Weights   02/19/18 1802 02/20/18 1854 02/21/18 0530  Weight: 293 lb 3.4 oz (133 kg) 274 lb 4.8 oz (124.4 kg) 276 lb 10.8 oz (125.5 kg)    Telemetry    N/a- not on tele - Personally Reviewed   Physical Exam   GEN: Morbidly obese WM  no acute distress.  HEENT: Normocephalic, atraumatic, sclera non-icteric. Neck: No JVD or bruits. Cardiac: RRR no murmurs, rubs, or gallops.  Radials/DP/PT 1+ and equal bilaterally.  Respiratory: Clear to auscultation bilaterally. Breathing is unlabored. GI: Soft, nontender, non-distended, BS +x 4. MS: no deformity. Extremities: No clubbing or cyanosis. Chronic appearing trace-1+ BLE edema with severe venous stasis changes. Neuro:  AAOx3. Follows commands. Psych:  Responds to questions appropriately with a normal affect.  Labs    Chemistry Recent Labs  Lab 02/19/18 0559 02/20/18 0614 02/21/18 0605  NA 137 133* 132*  K 4.4 4.2 3.7  CL 100* 92* 88*  CO2 26 30 32  GLUCOSE 154* 162* 83  BUN 27* 23* 24*  CREATININE 0.74 0.69 0.84  CALCIUM 8.6* 8.7* 8.5*  GFRNONAA >60 >60 >60  GFRAA >60 >60 >60  ANIONGAP 11 11 12      Hematology Recent Labs  Lab 02/17/18 0413 02/18/18 0548 02/19/18 0559  WBC 8.1 6.1 7.0  RBC 4.14* 4.42 4.29  HGB 10.8* 11.4* 11.1*  HCT 34.3* 37.5* 35.9*  MCV 82.9 84.8 83.7  MCH 26.1 25.8* 25.9*  MCHC 31.5 30.4 30.9  RDW 15.0 14.9 14.8  PLT 169 175 191  Cardiac Enzymes Recent Labs  Lab 02/17/18 0413  TROPONINI <0.03   No results for input(s): TROPIPOC in the last 168 hours.   BNP Recent Labs  Lab 02/17/18 0413  BNP 439.0*     DDimer No results for input(s): DDIMER in the last 168 hours.   Radiology    No results found.  Cardiac Studies   2D echo 05/2016 Study Conclusions - Left ventricle: The cavity size was normal. Wall thickness was   increased in a pattern of mild LVH. Systolic function was   moderately reduced. The estimated ejection fraction was in the   range of 35% to 40%. Diffuse hypokinesis. Doppler parameters are   consistent with abnormal left ventricular relaxation (grade 1   diastolic dysfunction). The E/e&' ratio is >15, suggesting   elevated LV filling pressure. Ejection fraction (MOD, 2-plane):   38%. - Mitral  valve: Mildly thickened leaflets . There was mild   regurgitation. - Left atrium: Moderately dilated. - Right ventricle: The cavity size was normal. Systolic function   was normal. Lateral annulus peak S velocity: 17.2 cm/s. - Right atrium: The atrium was mildly dilated. - Tricuspid valve: There was moderate regurgitation. - Pulmonary arteries: PA peak pressure: 40 mm Hg (S). - Inferior vena cava: The vessel was normal in size. The   respirophasic diameter changes were in the normal range (>= 50%),   consistent with normal central venous pressure. Impressions: - Compared to the prior study in 01/2016, the LVEF has improved to   35-40%.  Patient Profile     72 y.o. male with past medical history of CAD (nonosbtructive CAD by cath in 01/2016), chronic combined systolic and diastolic CHF (EF previously 20%, improved to 35-40% by echo in 05/2016), nonischemic cardiomyopathy (s/p [employer] ICD placement in 06/2016), IDDM, HTN, HLD, and morbid obesitywho is being seen today for the evaluation ofCHFat the request of Dr. Wynetta Emery.     Assessment & Plan    1. Acute on chronic combined CHF in setting of med/lifestyle noncompliance- weight 302 on adm down to 276 today. Not clear if I/O's accurate as -13L recorded just yesterday. Regardless he feels markedly improved. Reviewed importance of daily weights and med compliance with pt. Will rx CHF book as well. Given dropping sodium level, will hold metolazone today and discuss plans for home diuretic regimen with MD. Patient really wants to go home today if possible. Has f/u already scheduled 3/12 with Dr. Debara Pickett - sent message to [facility] clinical pool for [facility] phone call.  2. Hyponatremia - suspect due to metolazone. Can be trended as OP.  3. Super morbid obesity - used to weigh up to 330lb and has made lifestyle changes over the years to address, but recently fell off the horse. Seems motivated to get back on track. Sleep study ordered in 2018 but never  obtained by patient (lots of orders in over the years) - recommend arranging in follow-up if patient amenable.  4. HTN - BP normal, low-normal at times. As above, hold further metolazone. Will review regimen with MD.  For questions or updates, please contact [place] Please consult [email or web address] for contact info under Cardiology/STEMI.  Signed, Charlie Pitter, PA-C 02/21/2018, 8:52 AM    Patient seen and discussed with PA Dunn, I agree with her documentation above. Admitted with acute on chronic comobined systolic/diastolic HF. I/Os are unclear as a negative 14 liters was reported yesterday and I was not able to confirm the validity of this after discussing with  nursing staff. He has been on lasix 80mg  IV bid and receiving metolazone 5mg  daily, reported negative 4.7 liters yesterday.  Mild uptrend in Cr and mild downtrend in sodium with diuresis. It is documented issues with diuretics is compliance as opposed to treatment failure, we will resume his home lasix dosing of 80mg  bid. We will have patient ambulate with nursing staff, if does well would be ok for discharge today.   Carlyle Dolly MD

## 2018-02-21 NOTE — Telephone Encounter (Addendum)
Appt is scheduled for 3/12  Current admit 3/5

## 2018-02-22 DIAGNOSIS — R739 Hyperglycemia, unspecified: Secondary | ICD-10-CM

## 2018-02-22 LAB — BASIC METABOLIC PANEL
ANION GAP: 13 (ref 5–15)
BUN: 28 mg/dL — ABNORMAL HIGH (ref 6–20)
CALCIUM: 8.1 mg/dL — AB (ref 8.9–10.3)
CO2: 30 mmol/L (ref 22–32)
CREATININE: 0.96 mg/dL (ref 0.61–1.24)
Chloride: 89 mmol/L — ABNORMAL LOW (ref 101–111)
Glucose, Bld: 88 mg/dL (ref 65–99)
Potassium: 3.8 mmol/L (ref 3.5–5.1)
Sodium: 132 mmol/L — ABNORMAL LOW (ref 135–145)

## 2018-02-22 LAB — GLUCOSE, CAPILLARY
GLUCOSE-CAPILLARY: 99 mg/dL (ref 65–99)
Glucose-Capillary: 106 mg/dL — ABNORMAL HIGH (ref 65–99)

## 2018-02-22 MED ORDER — FUROSEMIDE 40 MG PO TABS: 80.0000 mg | ORAL_TABLET | Freq: Two times a day (BID) | ORAL | 5 refills | Status: DC

## 2018-02-22 MED ORDER — CARVEDILOL 3.125 MG PO TABS: 3.1250 mg | ORAL_TABLET | Freq: Two times a day (BID) | ORAL | 0 refills | Status: DC

## 2018-02-22 MED ORDER — ROSUVASTATIN CALCIUM 20 MG PO TABS: 20.0000 mg | ORAL_TABLET | Freq: Every day | ORAL | 0 refills | Status: DC

## 2018-02-22 MED ORDER — BENAZEPRIL HCL 20 MG PO TABS: 20.0000 mg | ORAL_TABLET | Freq: Every day | ORAL | 0 refills | Status: DC

## 2018-02-22 NOTE — Progress Notes (Signed)
Patient IV removed, tolerated well. Urinary catheter removed, tolerated well. Patient given discharge instructions at bedside.

## 2018-02-22 NOTE — Discharge Summary (Signed)
Physician Discharge Summary  Jeffery Nelson KTG:[id number] DOB: 06/04/1946 DOA: 02/17/2018  PCP: [name], MD  Admit date: 02/17/2018 Discharge date: 02/22/2018  Admitted From: home Disposition:  home  Recommendations for Outpatient Follow-up:  1. Follow up with PCP in 1-2 weeks 2. Please obtain BMP/CBC in one week 3. Follow up with cardiology on 3/12  Home Health: [facility] Equipment/Devices:  Discharge Condition:stable CODE STATUS: full code Diet recommendation: Heart Healthy / Carb Modified   Brief/Interim Summary: 72 year old male with a history of nonischemic cardiomyopathy, EF of 35-40%, chronic combined CHF, diabetes and hypertension, presents to the hospital with progressive shortness of breath.  Patient admitted to being noncompliant with medications including his diuretics.  He was noted to be in volume overload and was admitted for IV diuresis.  Discharge Diagnoses:  Principal Problem:   Acute on chronic combined systolic and diastolic CHF (congestive heart failure) (HCC) Active Problems:   Morbid obesity (HCC)   Restless legs syndrome (RLS)   Non-ischemic cardiomyopathy (HCC)   Type II diabetes mellitus ([place])   AICD (automatic cardioverter/defibrillator) present   Venous stasis dermatitis of both lower extremities   CHF (congestive heart failure) (HCC)   Cellulitis of right leg  1. Acute on chronic combined CHF.  Ejection fraction of 35-40%.  Decompensation related to noncompliance with diuretics. He was treated with intravenous Lasix and metolazone.  He has lost approximately 13 pounds since admission.  Discharge weight is 270 pounds.  Renal function has remained stable.  Clinically he is better.  He is continued on beta-blockers and ACE inhibitors. Cardiology follow the patient.  He is able to ambulate on room air without difficulty.  Outpatient follow-up with cardiology has been arranged.  Patient will be discharged today with home health services. 2. Hypertension.   Blood pressure currently stable on Coreg and benazepril.  Continue current treatments 3. Hyperlipidemia.  Continue on Crestor. 4. Diabetes.  On 70/30 insulin and sliding scale.  Blood sugars are stable. 5. Venous stasis.  Unlikely cellulitis.  Will hold off on antibiotics for now.  Keep lower extremities elevated.  Appears to be improving     Discharge Instructions  Discharge Instructions    Diet - low sodium heart healthy   Complete by:  As directed    Increase activity slowly   Complete by:  As directed      Allergies as of 02/22/2018      Reactions   Doxycycline    RECTAL BLEEDING   Lipitor [atorvastatin] Swelling   Lyrica [pregabalin] Swelling      Medication List    STOP taking these medications   quinapril 20 MG tablet Commonly known as:  ACCUPRIL     TAKE these medications   aspirin EC 81 MG tablet Take 81 mg by mouth daily.   benazepril 20 MG tablet Commonly known as:  LOTENSIN Take 1 tablet (20 mg total) by mouth daily. Start taking on:  02/23/2018   carvedilol 3.125 MG tablet Commonly known as:  COREG Take 1 tablet (3.125 mg total) by mouth 2 (two) times daily. What changed:    medication strength  how much to take   FLUoxetine 20 MG tablet Commonly known as:  PROZAC Take 20 mg by mouth daily.   furosemide 40 MG tablet Commonly known as:  LASIX Take 2 tablets (80 mg total) by mouth 2 (two) times daily.   HUMALOG MIX 75/25 (75-25) 100 UNIT/ML Susp injection Generic drug:  insulin lispro protamine-lispro Inject 55-70 Units as directed  daily. Inject 70 units at breakfast and dinner, and 55 units at lunch time   potassium chloride SA 20 MEQ tablet Commonly known as:  KLOR-CON M20 Take 1 tablet (20 mEq total) by mouth 2 (two) times daily.   rOPINIRole 4 MG tablet Commonly known as:  REQUIP Take 4 mg by mouth daily.   rosuvastatin 20 MG tablet Commonly known as:  CRESTOR Take 1 tablet (20 mg total) by mouth daily. Start taking on:  02/23/2018    vitamin B-12 1000 MCG tablet Commonly known as:  CYANOCOBALAMIN Take 1,000 mcg by mouth daily.      Follow-up Information    Hilty, Nadean Corwin, MD Follow up.   Specialty:  Cardiology Why:  Keep follow-up scheduled on 02/28/18 as planned - arrive at 8am for 8:15 appointment. Contact information: [address] [place] [address] [phone]          Allergies  Allergen Reactions  . Doxycycline     RECTAL BLEEDING  . Lipitor [Atorvastatin] Swelling  . Lyrica [Pregabalin] Swelling    Consultations:  cardiology   Procedures/Studies: Dg Chest Port 1 View  Result Date: 02/17/2018 CLINICAL DATA:  72 year old male with shortness of breath. EXAM: PORTABLE CHEST 1 VIEW COMPARISON:  Chest radiograph dated 06/22/2016 FINDINGS: There is cardiomegaly with vascular congestion. Shallow inspiration with bibasilar atelectasis. No focal consolidation, pleural effusion, or pneumothorax. Left pectoral pacemaker device. No acute osseous pathology. IMPRESSION: Cardiomegaly with mild vascular congestion.  No focal consolidation. Electronically Signed   By: Anner Crete M.D.   On: 02/17/2018 04:29       Subjective: Feeling better. Shortness of breath improving. No chest pain  Discharge Exam: Vitals:   02/21/18 2212 02/22/18 0622  BP: 110/71 118/65  Pulse: 84 80  Resp: 16 16  Temp: 98.2 F (36.8 C) 98.2 F (36.8 C)  SpO2: 93% 94%   Vitals:   02/21/18 1602 02/21/18 1940 02/21/18 2212 02/22/18 0622  BP: 108/61  110/71 118/65  Pulse: 80  84 80  Resp:   16 16  Temp:   98.2 F (36.8 C) 98.2 F (36.8 C)  TempSrc:   Oral Oral  SpO2: (!) 88% 92% 93% 94%  Weight:    122.5 kg (270 lb 1 oz)  Height:        General: Pt is alert, awake, not in acute distress Cardiovascular: RRR, S1/S2 +, no rubs, no gallops Respiratory: CTA bilaterally, no wheezing, no rhonchi Abdominal: Soft, NT, ND, bowel sounds + Extremities: 1+ edema, no cyanosis    The results of  significant diagnostics from this hospitalization (including imaging, microbiology, ancillary and laboratory) are listed below for reference.     Microbiology: Recent Results (from the past 240 hour(s))  MRSA PCR Screening     Status: None   Collection Time: 02/17/18 12:46 PM  Result Value Ref Range Status   MRSA by PCR NEGATIVE NEGATIVE Final    Comment:        The GeneXpert MRSA Assay (FDA approved for NASAL specimens only), is one component of a comprehensive MRSA colonization surveillance program. It is not intended to diagnose MRSA infection nor to guide or monitor treatment for MRSA infections. Performed at [facility], [address] [place], [place] [address]      Labs: BNP (last 3 results) Recent Labs    04/13/17 1520 02/17/18 0413  BNP 108.8* 326.7*   Basic Metabolic Panel: Recent Labs  Lab 02/18/18 0548 02/19/18 0559 02/20/18 1245 02/21/18 0605 02/22/18  0559  NA 137 137 133* 132* 132*  K 4.2 4.4 4.2 3.7 3.8  CL 99* 100* 92* 88* 89*  CO2 28 26 30  32 30  GLUCOSE 110* 154* 162* 83 88  BUN 28* 27* 23* 24* 28*  CREATININE 0.76 0.74 0.69 0.84 0.96  CALCIUM 8.2* 8.6* 8.7* 8.5* 8.1*  MG 1.9  --   --   --   --    Liver Function Tests: No results for input(s): AST, ALT, ALKPHOS, BILITOT, PROT, ALBUMIN in the last 168 hours. No results for input(s): LIPASE, AMYLASE in the last 168 hours. No results for input(s): AMMONIA in the last 168 hours. CBC: Recent Labs  Lab 02/17/18 0413 02/18/18 0548 02/19/18 0559  WBC 8.1 6.1 7.0  NEUTROABS 5.8  --   --   HGB 10.8* 11.4* 11.1*  HCT 34.3* 37.5* 35.9*  MCV 82.9 84.8 83.7  PLT 169 175 191   Cardiac Enzymes: Recent Labs  Lab 02/17/18 0413  TROPONINI <0.03   BNP: Invalid input(s): POCBNP CBG: Recent Labs  Lab 02/21/18 0739 02/21/18 1141 02/21/18 1715 02/21/18 2035 02/22/18 1111  GLUCAP 97 120* 93 100* 106*   D-Dimer No results for input(s): DDIMER in the last 72 hours. Hgb A1c No results for  input(s): HGBA1C in the last 72 hours. Lipid Profile No results for input(s): CHOL, HDL, LDLCALC, TRIG, CHOLHDL, LDLDIRECT in the last 72 hours. Thyroid function studies No results for input(s): TSH, T4TOTAL, T3FREE, THYROIDAB in the last 72 hours.  Invalid input(s): FREET3 Anemia work up No results for input(s): VITAMINB12, FOLATE, FERRITIN, TIBC, IRON, RETICCTPCT in the last 72 hours. Urinalysis    Component Value Date/Time   COLORURINE AMBER BIOCHEMICALS MAY BE AFFECTED BY COLOR (A) 08/01/2010 2020   APPEARANCEUR CLOUDY (A) 08/01/2010 2020   LABSPEC 1.026 08/01/2010 2020   PHURINE 5.0 08/01/2010 2020   GLUCOSEU 100 (A) 08/01/2010 2020   HGBUR NEGATIVE 08/01/2010 2020   BILIRUBINUR SMALL (A) 08/01/2010 2020   KETONESUR TRACE (A) 08/01/2010 2020   PROTEINUR NEGATIVE 08/01/2010 2020   UROBILINOGEN 0.2 08/01/2010 2020   NITRITE NEGATIVE 08/01/2010 2020   LEUKOCYTESUR  08/01/2010 2020    NEGATIVE MICROSCOPIC NOT DONE ON URINES WITH NEGATIVE PROTEIN, BLOOD, LEUKOCYTES, NITRITE, OR GLUCOSE <1000 mg/dL.   Sepsis Labs Invalid input(s): PROCALCITONIN,  WBC,  LACTICIDVEN Microbiology Recent Results (from the past 240 hour(s))  MRSA PCR Screening     Status: None   Collection Time: 02/17/18 12:46 PM  Result Value Ref Range Status   MRSA by PCR NEGATIVE NEGATIVE Final    Comment:        The GeneXpert MRSA Assay (FDA approved for NASAL specimens only), is one component of a comprehensive MRSA colonization surveillance program. It is not intended to diagnose MRSA infection nor to guide or monitor treatment for MRSA infections. Performed at [facility], [address], [place], [place] [address]      Time coordinating discharge: Over 30 minutes  SIGNED:   Kathie Dike, MD  [facility] 02/22/2018, 1:21 PM Pager   If 7PM-7AM, please contact night-coverage [email or web address] Password [email or web address]

## 2018-02-22 NOTE — Telephone Encounter (Signed)
Patient is currently admitted

## 2018-02-22 NOTE — Progress Notes (Signed)
SATURATION QUALIFICATIONS: (This note is used to comply with regulatory documentation for home oxygen)  Patient Saturations on Room Air at Rest = 96%  Patient Saturations on Room Air while Ambulating = 90%  Patient Saturations on  Liters of oxygen while Ambulating = %  Please briefly explain why patient needs home oxygen: 

## 2018-02-23 NOTE — Telephone Encounter (Signed)
Patient contacted regarding discharge from [facility] on 02-22-18  Patient understands to follow up with provider hilty on 02-28-18 at 8:15 am at [facility] Patient understands discharge instructions? yes Patient understands medications and regiment? yes Patient understands to bring all medications to this visit? yes

## 2018-02-28 ENCOUNTER — Ambulatory Visit (INDEPENDENT_AMBULATORY_CARE_PROVIDER_SITE_OTHER): Payer: Medicare Other | Admitting: Internal Medicine

## 2018-02-28 ENCOUNTER — Telehealth: Payer: Self-pay | Admitting: Internal Medicine

## 2018-02-28 ENCOUNTER — Encounter: Payer: Self-pay | Admitting: Internal Medicine

## 2018-02-28 VITALS — BP 136/84 | HR 95 | Ht 64.0 in | Wt 269.4 lb

## 2018-02-28 DIAGNOSIS — I5023 Acute on chronic systolic (congestive) heart failure: Secondary | ICD-10-CM

## 2018-02-28 DIAGNOSIS — R5381 Other malaise: Secondary | ICD-10-CM | POA: Insufficient documentation

## 2018-02-28 DIAGNOSIS — Z79899 Other long term (current) drug therapy: Secondary | ICD-10-CM

## 2018-02-28 DIAGNOSIS — I428 Other cardiomyopathies: Secondary | ICD-10-CM | POA: Diagnosis not present

## 2018-02-28 HISTORY — DX: Other malaise: R53.81

## 2018-02-28 NOTE — Patient Instructions (Addendum)
Your physician recommends that you return for lab work - BMET -- this can be done in our office, by home health, or by your PCP THIS WEEK  You have been referred to physical therapy - we will contact [facility] to get this ordered  Your physician recommends that you schedule a follow-up appointment in [place]

## 2018-02-28 NOTE — Telephone Encounter (Signed)
Called [facility] and requested PT evaluation/therapy be initiated under Dr. Debara Pickett for this patient. He has home health orders thru this agency, ordered by another HCP already. Spoke with nurse manager who states forms will be faxed for Dr. Debara Pickett to sign once patient has been evaluated.

## 2018-02-28 NOTE — Progress Notes (Signed)
[name]    OFFICE NOTE  Chief Complaint:  Follow-up hospitalization for heart failure  Primary Care Physician: [name], MD  HPI:  Jeffery Nelson is a 72 year old gentleman referred to me for lower extremity edema and shortness of breath. As you know, he is morbidly obese, almost super morbidly obese with a weight of 330 pounds, BMI of 54. He underwent an echocardiogram which demonstrated mild concentric LVH, EF greater than 55%. The left atrium was mildly dilated. However, there were no significant valvular abnormalities. Pulmonary pressures were not elevated. He also underwent lower extremity Dopplers looking for venous insufficiency. There was a small amount of reflux in the right common femoral vein, which is a deep vein. However, the superficial veins were negative for reflux or treatable venous insufficiency. I reviewed the results with the patient today and I feel that his best option is to continue with lower extremity compression stockings and work on weight loss. He is also at high risk for sleep apnea and I have referred him for a sleep study - unfortunately he never made this appointment.  Currently has no other symptoms other than he related a recent bout of shingles that he underwent. This has since resolved and he was not left with any chronic pain.  I saw Jeffery Nelson back in the office today. Overall he is doing fairly well. He had left hip replacement placement recently due to what sound like a spontaneous fracture. Apparently he has of some degree of osteopenia. He was supposed been medicine for that but is not taking it. Fortunately has had about 30-35 pound weight loss and although he never got his foot split-night sleep study, he denies any new apnea symptoms and feels like he is rested during the day. He has been getting a little more shortness of breath mostly when laying down but that is gotten better and he has no new complaints today.  Jeffery Nelson returns today in the  office. He reports that he's recently had some weight gain and lower extremity swelling. He says it is also has some shortness of breath with exertion. Swelling seems to be a little worse in the right leg than the left. He reports she's been compliant with his current dose of Lasix. He is also taking over-the-counter potassium supplements. He denies any chest pain.  I saw Jeffery Nelson back today in follow-up. He reports he still persistently short of breath. I asked him to increase his Lasix and prescribe some potassium however he said he didn't have the money to get potassium and therefore was nervous about taking extra Lasix so he never increased the dose the medicine. In the interim though he's gained about 6 pounds. He does have lower strandy swelling and persistent shortness of breath. His echocardiogram unfortunate shows a new cardiomyopathy with an EF of 20-25% with global hypokinesis which is severe. Previous echo at least a year ago showed an EF which was normal. He denies any chest pain episodes or recent significant viral illnesses that might explain his reduced LV function. I'm surly concerned about coronary artery disease. He also denies any alcohol or drug use.  Jeffery Nelson returns today for follow-up of his heart catheterization, the results are as follows:   Ost 1st Diag to 1st Diag lesion, 50% stenosed.  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 Moderate pulmonary venous hypertension, reduced cardiac index. Elevated PCWP. Will need additional  diuresis and medication adjustment for non-ischemic cardiomyopathy as an outpatient. I recommended increasing his Lasix to 40 mg twice a day. Since that change she's had significant weight loss. Weight today is 258, which is down from 281. He said no, locations from his radial catheterization site. As mentioned, the plan is to  titrate his medical therapy. He reports his shortness of breath is completely resolved and his leg swelling is improved significantly.  06/03/2016  Jeffery Nelson returns today for follow-up. He has gained about 15 pounds. There is some extra edema but mostly this is weight gain not related to heart failure. However he is not as compliant with twice-daily Lasix. He denies any worsening shortness of breath. He had another episode of syncope which was witnessed. He became apparently pale and unresponsive. By the time EMS arrived he was doing well and declined transport to the hospital. He's had 3 episodes in about 6 months. This was the first episode that was witnessed. I'm concerned about a possible arrhythmia.  09/07/2016  Jeffery Nelson seen today in follow-up. He reports little improvement with increasing his Lasix. Weight is now up to 301 pounds, from 276 lbs approximately 3 months ago. He reports significant lower extremity swelling and weeping edema. 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 He has had no complications from that AICD.  04/01/2017  Jeffery Nelson returns today for follow-up. Again there is been some lack of compliance with medications. He's recently started having some weeping edema again of the right lower extremity. This is been wrapped by his primary care provider. He reports it is only taking Lasix 40 mg twice a day not 80 mg twice a day as previously prescribed. He was also on metolazone but he said he could not afford that as it was not covered. Weight is now back to 301 pounds which it was in September however he did come down significantly to 291 pounds in October.  08/23/2017  Jeffery Nelson seen today in follow-up. Unfortunately he's been off of his medications for about 2 weeks due to cost issues. He says is in the donut hole and had to have his insulin readjusted. This morning he had a hypoglycemic episode. He  was aware of and had a diaphoresis and shakiness. He took some sugar and improved. He says he's not taken his diuretic and his weight is creeping back up to where was a few months ago. When he was previously taken his 80 mg of Lasix twice daily his BMP and lowered to 108 and his weeping edema had improved. Today he has weeping edema again with asymmetric edema the right lower extremity chronic venous stasis changes. Eyes chest pain or worsening shortness of breath. Blood pressure is elevated but again he has not taken his quinapril. He had AICD follow-up with Dr. Lovena Le in July and he felt that everything was normal with the device.  02/28/2018  Jeffery Nelson returns today for follow-up of recent hospitalization for heart failure.  He was hospitalized at [facility] on February 17, 2018 and discharged on February 22, 2018.  His admission weight was 330 pounds however his discharge weight was 276 pounds after aggressive diuresis.  Today he is weighing in at 269 pounds.  He reports his edema has for the most part stayed off.  Most of his prior admission was related to noncompliance with medications.  He said he was helping out some  neighbors and not taking care of himself.  Blood pressure is well controlled today 136/84.  Unfortunately got a viral URI which she says occurred during his hospitalization.  He has had some congestion and cough.  He denies any fever, chills or body aches.  He is able to keep fluids down.  He is receiving some home care from [facility], but reports deconditioning.  He is wondering if he would benefit from some home health services.  PMHx:  Past Medical History:  Diagnosis Date  . AICD (automatic cardioverter/defibrillator) present    a. 06/21/2016 SJM single lead AICD (ser # [id number]).  . Benign neoplasm of colon   . Chest pain    From rib fractures  . Chronic combined systolic and diastolic CHF (congestive heart failure) ([place])    a. 01/2016 Echo: EF 20-25%, diff HK; b. 05/2016  Echo: EF 35-40%, diff HK, gr1 DD, diff HK, mild MR, mod dil LA/RA, mod TR, PASP 79mmHg.  [name] Gout   . Hypertensive heart disease with heart failure ([place])   . Lower extremity edema   . NICM (nonischemic cardiomyopathy) ([place])     01/2016 Echo: EF 20-25%, b. 01/2016 Cath: LM nl, LAD min irregs, D1 50ost, LCX min irregs, RCA min irregs, RPDA/RPL1 nl;  c. 05/2016 Echo: EF 35-40%;  d. 06/2016 s/p SJM single lead AICD (ser # [id number])..  . Non-obstructive CAD    a. 01/2016 Cath: LM nl, LAD min irregs, D1 50ost, LCX min irregs, RCA min irregs, RPDA/RPL1 nl.  . Polyneuropathy in diabetes(357.2)   . Psychosexual dysfunction with inhibited sexual excitement   . Pure hypercholesterolemia   . Restless legs syndrome (RLS)   . Sleep apnea    "probably" (06/21/2016)  . Sleep arousal disorder   . Syncope    a. Presumed to be 2/2 VT in setting of NICM-->s/p single lead SJM AICD in 06/2016.  . Type II diabetes mellitus ([place])   . Unspecified venous (peripheral) insufficiency     Past Surgical History:  Procedure Laterality Date  . CARDIAC CATHETERIZATION N/A 02/12/2016   Procedure: Right/Left Heart Cath and Coronary Angiography;  Surgeon: Pixie Casino, MD;  Location: [facility];  Service: Cardiovascular;  Laterality: N/A;  . CATARACT EXTRACTION W/ INTRAOCULAR LENS  IMPLANT, BILATERAL Bilateral 2009  . EP IMPLANTABLE DEVICE N/A 06/21/2016   Procedure: ICD Implant;  Surgeon: Evans Lance, MD;  Location: [facility];  Service: Cardiovascular;  Laterality: N/A;  . FIXATION KYPHOPLASTY LUMBAR SPINE  2008  . FRACTURE SURGERY    . INTRAMEDULLARY (IM) NAIL INTERTROCHANTERIC Left 09/21/2014   Procedure: left hip intertroch fracture;  Surgeon: [name], MD;  Location: [facility];  Service: Orthopedics;  Laterality: Left;  . LOWER EXTREMITY VENOUS DOPPLER  09/04/2012   Mild valvular insufficiency in the R Common Femoral vein w/ 1.6 sec of duration of refliux  . TRANSTHORACIC ECHOCARDIOGRAM  09/04/2012   EF  >55%, normal    FAMHx:  Family History  Problem Relation Age of Onset  . Heart disease Mother   . Heart disease Father   . Heart disease Maternal Grandmother   . Heart disease Maternal Grandfather   . Stroke Paternal Grandmother   . Cancer Paternal Grandfather        Skin cancer  . Diabetes Brother     SOCHx:   reports that  has never smoked. he has never used smokeless tobacco. He reports that he does not drink alcohol or use drugs.  ALLERGIES:  Allergies  Allergen Reactions  . Doxycycline     RECTAL BLEEDING  . Lipitor [Atorvastatin] Swelling  . Lyrica [Pregabalin] Swelling    ROS: Pertinent items noted in HPI and remainder of comprehensive ROS otherwise negative.  HOME MEDS: Current Outpatient Medications  Medication Sig Dispense Refill  . aspirin EC 81 MG tablet Take 81 mg by mouth daily.    . benazepril (LOTENSIN) 20 MG tablet Take 1 tablet (20 mg total) by mouth daily. 30 tablet 0  . carvedilol (COREG) 3.125 MG tablet Take 1 tablet (3.125 mg total) by mouth 2 (two) times daily. 30 tablet 0  . FLUoxetine (PROZAC) 20 MG tablet Take 20 mg by mouth daily.    . furosemide (LASIX) 40 MG tablet Take 2 tablets (80 mg total) by mouth 2 (two) times daily. 120 tablet 5  . HUMALOG MIX 75/25 (75-25) 100 UNIT/ML SUSP injection Inject 55-70 Units as directed daily. Inject 70 units at breakfast and dinner, and 55 units at lunch time  2  . potassium chloride SA (KLOR-CON M20) 20 MEQ tablet Take 1 tablet (20 mEq total) by mouth 2 (two) times daily. 180 tablet 3  . rOPINIRole (REQUIP) 4 MG tablet Take 4 mg by mouth daily.     . rosuvastatin (CRESTOR) 20 MG tablet Take 1 tablet (20 mg total) by mouth daily. 30 tablet 0  . vitamin B-12 (CYANOCOBALAMIN) 1000 MCG tablet Take 1,000 mcg by mouth daily.     No current facility-administered medications for this visit.     LABS/IMAGING: No results found for this or any previous visit (from the past 48 hour(s)). No results  found.  VITALS: BP 136/84   Pulse 95   Ht 5\' 4"  (1.626 m)   Wt 269 lb 6.4 oz (122.2 kg)   SpO2 96%   BMI 46.24 kg/m  General appearance: alert and no distress Neck: no carotid bruit, no JVD and thyroid not enlarged, symmetric, no tenderness/mass/nodules Lungs: clear to auscultation bilaterally Heart: regular rate and rhythm Abdomen: morbidly obese Extremities: edema 1+ bilateral lower extremity stasis edema with erythema, blisters and venous congestion Pulses: Diminished pulses Skin: Peripheral erythema Woody stasis edema Neurologic: Grossly normal Psych: Mildly depressed  EKG: Deferred  ASSESSMENT: 1. New systolic congestive heart failure with EF 20-25% - nonischemic cardiomyopathy with mild CAD 2. Dyspnea, weight gain, leg swelling 3. Morbid obesity 4. Bilateral deep vein reflux with edema, right greater than left 5. Probable obstructive sleep apnea - awaiting sleep study 6. Recent total L hip arthroplasty 7. 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   Mr. Mata has had a recent exacerbation of congestive heart failure and was hospitalized, requiring diuresis.  He lost about 60 pounds and continues to diurese.  He is on supplemental potassium.  I like to recheck a metabolic profile this week.  He does feel deconditioned and likely would benefit from home physical therapy.  He is already getting home services by [facility].  We will contact them to see if they can provide home PT at my order.  Follow-up in 3 months.  Pixie Casino, MD, [facility], [place] Director of the Advanced Lipid Disorders &  Cardiovascular Risk Reduction Clinic Diplomate of the American Board of Clinical Lipidology Attending Cardiologist  Direct Dial: [phone]  Fax: [phone]  Website:  [email or web address] 02/28/2018, 10:42 AM

## 2018-03-08 ENCOUNTER — Other Ambulatory Visit: Payer: Self-pay | Admitting: Internal Medicine

## 2018-03-09 LAB — BASIC METABOLIC PANEL WITH GFR
BUN/Creatinine Ratio: 31 (calc) — ABNORMAL HIGH (ref 6–22)
BUN: 27 mg/dL — AB (ref 7–25)
CALCIUM: 9 mg/dL (ref 8.6–10.3)
CO2: 29 mmol/L (ref 20–32)
CREATININE: 0.88 mg/dL (ref 0.70–1.18)
Chloride: 102 mmol/L (ref 98–110)
GFR, EST AFRICAN AMERICAN: 99 mL/min/{1.73_m2} (ref >=60)
GFR, EST NON AFRICAN AMERICAN: 86 mL/min/{1.73_m2} (ref >=60)
Glucose, Bld: 157 mg/dL — ABNORMAL HIGH (ref 65–99)
Potassium: 4.4 mmol/L (ref 3.5–5.3)
Sodium: 138 mmol/L (ref 135–146)

## 2018-03-27 ENCOUNTER — Telehealth: Payer: Self-pay | Admitting: Internal Medicine

## 2018-03-27 NOTE — Telephone Encounter (Signed)
Dawn Investment banker, corporate) with [employer],   States that she would like to let Dr. Debara Pickett know that patient is in Heart Failure program. Arrie Aran would also like to discuss patient most recent BP and HR

## 2018-03-27 NOTE — Telephone Encounter (Signed)
Left message to call back  

## 2018-03-29 NOTE — Telephone Encounter (Signed)
Received call from [place] with [facility] calling to obtain patient's last B/P and pulse.

## 2018-04-04 ENCOUNTER — Ambulatory Visit (INDEPENDENT_AMBULATORY_CARE_PROVIDER_SITE_OTHER): Payer: Medicare Other | Admitting: *Deleted

## 2018-04-04 DIAGNOSIS — I428 Other cardiomyopathies: Secondary | ICD-10-CM

## 2018-04-04 NOTE — Progress Notes (Signed)
Remote ICD transmission.   

## 2018-04-06 ENCOUNTER — Encounter: Payer: Self-pay | Admitting: Cardiology

## 2018-04-10 LAB — CUP PACEART REMOTE DEVICE CHECK
Battery Remaining Percentage: 85 %
Battery Voltage: 3.01 V
Brady Statistic RV Percent Paced: 1 %
Date Time Interrogation Session: 20190416060025
HIGH POWER IMPEDANCE MEASURED VALUE: 82 Ohm
HIGH POWER IMPEDANCE MEASURED VALUE: 82 Ohm
Implantable Lead Location: 753860
Implantable Lead Model: 7122
Implantable Pulse Generator Implant Date: 20170703
Lead Channel Impedance Value: 540 Ohm
Lead Channel Pacing Threshold Amplitude: 1 V
Lead Channel Pacing Threshold Pulse Width: 0.5 ms
Lead Channel Sensing Intrinsic Amplitude: 12 mV
Lead Channel Setting Pacing Amplitude: 2.5 V
Lead Channel Setting Pacing Pulse Width: 0.5 ms
MDC IDC LEAD IMPLANT DT: 20170703
MDC IDC MSMT BATTERY REMAINING LONGEVITY: 86 mo
MDC IDC PG SERIAL: 7372545
MDC IDC SET LEADCHNL RV SENSING SENSITIVITY: 0.5 mV

## 2018-05-04 ENCOUNTER — Other Ambulatory Visit: Payer: Self-pay | Admitting: Physician Assistant

## 2018-05-04 NOTE — Telephone Encounter (Signed)
Rx(s) sent to pharmacy electronically.  

## 2018-05-12 ENCOUNTER — Other Ambulatory Visit: Payer: Self-pay | Admitting: Internal Medicine

## 2018-05-12 MED ORDER — BENAZEPRIL HCL 20 MG PO TABS: 20.0000 mg | ORAL_TABLET | Freq: Every day | ORAL | 3 refills | Status: DC

## 2018-05-12 NOTE — Telephone Encounter (Signed)
Rx request sent to pharmacy.  

## 2018-05-12 NOTE — Telephone Encounter (Signed)
New message     *STAT* If patient is at the pharmacy, call can be transferred to refill team.   1. Which medications need to be refilled? (please list name of each medication and dose if known) Benazepril 20 mg   2. Which pharmacy/location (including street and city if local pharmacy) is medication to be sent to? [facility] on [address]   3. Do they need a 30 day or 90 day supply? 30 day

## 2018-05-22 ENCOUNTER — Ambulatory Visit: Payer: Medicare Other | Admitting: Internal Medicine

## 2018-05-22 ENCOUNTER — Encounter: Payer: Self-pay | Admitting: Internal Medicine

## 2018-05-22 VITALS — BP 105/61 | HR 72 | Ht 64.0 in | Wt 267.6 lb

## 2018-05-22 DIAGNOSIS — I5042 Chronic combined systolic (congestive) and diastolic (congestive) heart failure: Secondary | ICD-10-CM | POA: Diagnosis not present

## 2018-05-22 DIAGNOSIS — I428 Other cardiomyopathies: Secondary | ICD-10-CM

## 2018-05-22 DIAGNOSIS — M7989 Other specified soft tissue disorders: Secondary | ICD-10-CM | POA: Diagnosis not present

## 2018-05-22 DIAGNOSIS — I10 Essential (primary) hypertension: Secondary | ICD-10-CM

## 2018-05-22 NOTE — Patient Instructions (Signed)
Your physician has requested that you have an echocardiogram @ [address] - [address]. Echocardiography is a painless test that uses sound waves to create images of your heart. It provides your doctor with information about the size and shape of your heart and how well your heart's chambers and valves are working. This procedure takes approximately one hour. There are no restrictions for this procedure.   Your physician wants you to follow-up in: 6 months with Dr. Hilty. You will receive a reminder letter in the mail two months in advance. If you don't receive a letter, please call our office to schedule the follow-up appointment.  

## 2018-05-22 NOTE — Progress Notes (Signed)
[name]    OFFICE NOTE  Chief Complaint:  Follow-up of heart failure  Primary Care Physician: [name], MD  HPI:  KENYON EICHELBERGER is a 72 year old gentleman referred to me for lower extremity edema and shortness of breath. As you know, he is morbidly obese, almost super morbidly obese with a weight of 330 pounds, BMI of 54. He underwent an echocardiogram which demonstrated mild concentric LVH, EF greater than 55%. The left atrium was mildly dilated. However, there were no significant valvular abnormalities. Pulmonary pressures were not elevated. He also underwent lower extremity Dopplers looking for venous insufficiency. There was a small amount of reflux in the right common femoral vein, which is a deep vein. However, the superficial veins were negative for reflux or treatable venous insufficiency. I reviewed the results with the patient today and I feel that his best option is to continue with lower extremity compression stockings and work on weight loss. He is also at high risk for sleep apnea and I have referred him for a sleep study - unfortunately he never made this appointment.  Currently has no other symptoms other than he related a recent bout of shingles that he underwent. This has since resolved and he was not left with any chronic pain.  I saw Mr. Piccione back in the office today. Overall he is doing fairly well. He had left hip replacement placement recently due to what sound like a spontaneous fracture. Apparently he has of some degree of osteopenia. He was supposed been medicine for that but is not taking it. Fortunately has had about 30-35 pound weight loss and although he never got his foot split-night sleep study, he denies any new apnea symptoms and feels like he is rested during the day. He has been getting a little more shortness of breath mostly when laying down but that is gotten better and he has no new complaints today.  Mr. Rasheed returns today in the office. He reports that  he's recently had some weight gain and lower extremity swelling. He says it is also has some shortness of breath with exertion. Swelling seems to be a little worse in the right leg than the left. He reports she's been compliant with his current dose of Lasix. He is also taking over-the-counter potassium supplements. He denies any chest pain.  I saw Mr. Degrace back today in follow-up. He reports he still persistently short of breath. I asked him to increase his Lasix and prescribe some potassium however he said he didn't have the money to get potassium and therefore was nervous about taking extra Lasix so he never increased the dose the medicine. In the interim though he's gained about 6 pounds. He does have lower strandy swelling and persistent shortness of breath. His echocardiogram unfortunate shows a new cardiomyopathy with an EF of 20-25% with global hypokinesis which is severe. Previous echo at least a year ago showed an EF which was normal. He denies any chest pain episodes or recent significant viral illnesses that might explain his reduced LV function. I'm surly concerned about coronary artery disease. He also denies any alcohol or drug use.  Mr. Vandegrift returns today for follow-up of his heart catheterization, the results are as follows:   Ost 1st Diag to 1st Diag lesion, 50% stenosed.  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 Moderate pulmonary venous hypertension, reduced cardiac index. Elevated PCWP. Will need additional diuresis  and medication adjustment for non-ischemic cardiomyopathy as an outpatient. I recommended increasing his Lasix to 40 mg twice a day. Since that change she's had significant weight loss. Weight today is 258, which is down from 281. He said no, locations from his radial catheterization site. As mentioned, the plan is to titrate his medical  therapy. He reports his shortness of breath is completely resolved and his leg swelling is improved significantly.  06/03/2016  Mr. Caetano returns today for follow-up. He has gained about 15 pounds. There is some extra edema but mostly this is weight gain not related to heart failure. However he is not as compliant with twice-daily Lasix. He denies any worsening shortness of breath. He had another episode of syncope which was witnessed. He became apparently pale and unresponsive. By the time EMS arrived he was doing well and declined transport to the hospital. He's had 3 episodes in about 6 months. This was the first episode that was witnessed. I'm concerned about a possible arrhythmia.  09/07/2016  Mr. Speranza seen today in follow-up. He reports little improvement with increasing his Lasix. Weight is now up to 301 pounds, from 276 lbs approximately 3 months ago. He reports significant lower extremity swelling and weeping edema. 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 He has had no complications from that AICD.  04/01/2017  Mr. Mcenroe returns today for follow-up. Again there is been some lack of compliance with medications. He's recently started having some weeping edema again of the right lower extremity. This is been wrapped by his primary care provider. He reports it is only taking Lasix 40 mg twice a day not 80 mg twice a day as previously prescribed. He was also on metolazone but he said he could not afford that as it was not covered. Weight is now back to 301 pounds which it was in September however he did come down significantly to 291 pounds in October.  08/23/2017  Mr. Simington seen today in follow-up. Unfortunately he's been off of his medications for about 2 weeks due to cost issues. He says is in the donut hole and had to have his insulin readjusted. This morning he had a hypoglycemic episode. He was aware of and had a  diaphoresis and shakiness. He took some sugar and improved. He says he's not taken his diuretic and his weight is creeping back up to where was a few months ago. When he was previously taken his 80 mg of Lasix twice daily his BMP and lowered to 108 and his weeping edema had improved. Today he has weeping edema again with asymmetric edema the right lower extremity chronic venous stasis changes. Eyes chest pain or worsening shortness of breath. Blood pressure is elevated but again he has not taken his quinapril. He had AICD follow-up with Dr. Lovena Le in July and he felt that everything was normal with the device.  02/28/2018  Mr. Hemenway returns today for follow-up of recent hospitalization for heart failure.  He was hospitalized at [facility] on February 17, 2018 and discharged on February 22, 2018.  His admission weight was 330 pounds however his discharge weight was 276 pounds after aggressive diuresis.  Today he is weighing in at 269 pounds.  He reports his edema has for the most part stayed off.  Most of his prior admission was related to noncompliance with medications.  He said he was helping out some neighbors  and not taking care of himself.  Blood pressure is well controlled today 136/84.  Unfortunately got a viral URI which she says occurred during his hospitalization.  He has had some congestion and cough.  He denies any fever, chills or body aches.  He is able to keep fluids down.  He is receiving some home care from [facility], but reports deconditioning.  He is wondering if he would benefit from some home health services.  05/22/2018  Mr. Hoogendoorn returns for follow-up of his heart failure.  Overall he is doing well.  Weight is been stable.  He denies any worsening shortness of breath.  He is following his weight at home by his insurance company provided him with scales, pulse oximeter and other methods to monitor it.  We have not reassess LV function since 2017.  The time EF was as low as 20 to  25% but recovered to 35 to 40%.  He is due for remote pacer check in July and has an in office check with Dr. Lovena Le in August.  Current medications include carvedilol and benazepril, which she was switched to recently in the hospital.  PMHx:  Past Medical History:  Diagnosis Date  . AICD (automatic cardioverter/defibrillator) present    a. 06/21/2016 SJM single lead AICD (ser # [id number]).  . Benign neoplasm of colon   . Chest pain    From rib fractures  . Chronic combined systolic and diastolic CHF (congestive heart failure) ([place])    a. 01/2016 Echo: EF 20-25%, diff HK; b. 05/2016 Echo: EF 35-40%, diff HK, gr1 DD, diff HK, mild MR, mod dil LA/RA, mod TR, PASP 65mmHg.  [name] Gout   . Hypertensive heart disease with heart failure ([place])   . Lower extremity edema   . NICM (nonischemic cardiomyopathy) ([place])     01/2016 Echo: EF 20-25%, b. 01/2016 Cath: LM nl, LAD min irregs, D1 50ost, LCX min irregs, RCA min irregs, RPDA/RPL1 nl;  c. 05/2016 Echo: EF 35-40%;  d. 06/2016 s/p SJM single lead AICD (ser # [id number])..  . Non-obstructive CAD    a. 01/2016 Cath: LM nl, LAD min irregs, D1 50ost, LCX min irregs, RCA min irregs, RPDA/RPL1 nl.  . Polyneuropathy in diabetes(357.2)   . Psychosexual dysfunction with inhibited sexual excitement   . Pure hypercholesterolemia   . Restless legs syndrome (RLS)   . Sleep apnea    "probably" (06/21/2016)  . Sleep arousal disorder   . Syncope    a. Presumed to be 2/2 VT in setting of NICM-->s/p single lead SJM AICD in 06/2016.  . Type II diabetes mellitus ([place])   . Unspecified venous (peripheral) insufficiency     Past Surgical History:  Procedure Laterality Date  . CARDIAC CATHETERIZATION N/A 02/12/2016   Procedure: Right/Left Heart Cath and Coronary Angiography;  Surgeon: Pixie Casino, MD;  Location: [facility];  Service: Cardiovascular;  Laterality: N/A;  . CATARACT EXTRACTION W/ INTRAOCULAR LENS  IMPLANT, BILATERAL Bilateral 2009  . EP IMPLANTABLE DEVICE N/A  06/21/2016   Procedure: ICD Implant;  Surgeon: Evans Lance, MD;  Location: [facility];  Service: Cardiovascular;  Laterality: N/A;  . FIXATION KYPHOPLASTY LUMBAR SPINE  2008  . FRACTURE SURGERY    . INTRAMEDULLARY (IM) NAIL INTERTROCHANTERIC Left 09/21/2014   Procedure: left hip intertroch fracture;  Surgeon: [name], MD;  Location: [facility];  Service: Orthopedics;  Laterality: Left;  . LOWER EXTREMITY VENOUS DOPPLER  09/04/2012   Mild valvular insufficiency  in the R Common Femoral vein w/ 1.6 sec of duration of refliux  . TRANSTHORACIC ECHOCARDIOGRAM  09/04/2012   EF >55%, normal    FAMHx:  Family History  Problem Relation Age of Onset  . Heart disease Mother   . Heart disease Father   . Heart disease Maternal Grandmother   . Heart disease Maternal Grandfather   . Stroke Paternal Grandmother   . Cancer Paternal Grandfather        Skin cancer  . Diabetes Brother     SOCHx:   reports that he has never smoked. He has never used smokeless tobacco. He reports that he does not drink alcohol or use drugs.  ALLERGIES:  Allergies  Allergen Reactions  . Doxycycline     RECTAL BLEEDING  . Lipitor [Atorvastatin] Swelling  . Lyrica [Pregabalin] Swelling    ROS: Pertinent items noted in HPI and remainder of comprehensive ROS otherwise negative.  HOME MEDS: Current Outpatient Medications  Medication Sig Dispense Refill  . aspirin EC 81 MG tablet Take 81 mg by mouth daily.    . benazepril (LOTENSIN) 20 MG tablet Take 1 tablet (20 mg total) by mouth daily. 30 tablet 3  . carvedilol (COREG) 3.125 MG tablet Take 1 tablet (3.125 mg total) by mouth 2 (two) times daily with a meal. 180 tablet 2  . FLUoxetine (PROZAC) 20 MG tablet Take 20 mg by mouth daily.    . furosemide (LASIX) 40 MG tablet Take 2 tablets (80 mg total) by mouth 2 (two) times daily. 120 tablet 5  . HUMALOG MIX 75/25 (75-25) 100 UNIT/ML SUSP injection Inject 30-35 Units as directed 3 (three) times daily with meals.  Inject 70 units at breakfast and dinner, and 55 units at lunch time  2  . potassium chloride SA (KLOR-CON M20) 20 MEQ tablet Take 1 tablet (20 mEq total) by mouth 2 (two) times daily. 180 tablet 3  . rOPINIRole (REQUIP) 4 MG tablet Take 4 mg by mouth daily.     . rosuvastatin (CRESTOR) 20 MG tablet Take 1 tablet (20 mg total) by mouth daily. 30 tablet 0  . vitamin B-12 (CYANOCOBALAMIN) 1000 MCG tablet Take 1,000 mcg by mouth daily.     No current facility-administered medications for this visit.     LABS/IMAGING: No results found for this or any previous visit (from the past 48 hour(s)). No results found.  VITALS: BP 105/61   Pulse 72   Ht 5\' 4"  (1.626 m)   Wt 267 lb 9.6 oz (121.4 kg)   BMI 45.93 kg/m  General appearance: alert and no distress Neck: no carotid bruit, no JVD and thyroid not enlarged, symmetric, no tenderness/mass/nodules Lungs: clear to auscultation bilaterally Heart: regular rate and rhythm Abdomen: morbidly obese Extremities: edema 1+ bilateral lower extremity stasis edema with erythema, blisters and venous congestion Pulses: Diminished pulses Skin: Peripheral erythema Woody stasis edema Neurologic: Grossly normal Psych: Mildly depressed  EKG: Sinus rhythm at 72, LVH by voltage-personally reviewed  ASSESSMENT: 1. New systolic congestive heart failure with EF 20-25% (up to 35-40% - 2017) - nonischemic cardiomyopathy with mild CAD 2. Dyspnea, weight gain, leg swelling 3. Morbid obesity 4. Bilateral deep vein reflux with edema, right greater than left 5. Probable obstructive sleep apnea - awaiting sleep study 6. Recent total L hip arthroplasty 7. 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   Mr. Bartoszek seems to be stable, in fact his weight is down 2 pounds since I saw  him 3 months ago.  Blood pressure is low normal today.  He denies any worsening shortness of breath.  His edema is stable.  I like to reassess his echocardiogram as is  been 2 years since his last study.  LVEF was last 35 to 40%.  He has a follow-up with Dr. Lovena Le later this summer for reevaluation of his AICD.  If his LVEF is not normalized or is worse, I would likely consider switching his benazepril to Entresto.  Follow-up in 6 months.   Pixie Casino, MD, [facility], [place] Director of the [facility] Lip[facility]  Card[facility]ovascular [facility] Diplomate of the American Board of Clinical Lipidology Attending Cardiologist  Direct Dial: [phone]  Fax: [phone]  Website:  [email or web address]  Nadean Corwin Anajah Sterbenz 05/22/2018, 2:12 PM

## 2018-05-29 ENCOUNTER — Other Ambulatory Visit: Payer: Self-pay

## 2018-05-29 ENCOUNTER — Ambulatory Visit (HOSPITAL_COMMUNITY): Payer: Medicare Other | Attending: Cardiovascular Disease

## 2018-05-29 DIAGNOSIS — I429 Cardiomyopathy, unspecified: Secondary | ICD-10-CM | POA: Insufficient documentation

## 2018-05-29 DIAGNOSIS — G4730 Sleep apnea, unspecified: Secondary | ICD-10-CM | POA: Insufficient documentation

## 2018-05-29 DIAGNOSIS — I2510 Atherosclerotic heart disease of native coronary artery without angina pectoris: Secondary | ICD-10-CM | POA: Insufficient documentation

## 2018-05-29 DIAGNOSIS — E119 Type 2 diabetes mellitus without complications: Secondary | ICD-10-CM | POA: Diagnosis not present

## 2018-05-29 DIAGNOSIS — I272 Pulmonary hypertension, unspecified: Secondary | ICD-10-CM | POA: Insufficient documentation

## 2018-05-29 DIAGNOSIS — I428 Other cardiomyopathies: Secondary | ICD-10-CM | POA: Insufficient documentation

## 2018-06-12 ENCOUNTER — Other Ambulatory Visit: Payer: Self-pay | Admitting: Internal Medicine

## 2018-06-12 NOTE — Telephone Encounter (Signed)
Rx request sent to pharmacy.  

## 2018-06-14 ENCOUNTER — Telehealth: Payer: Self-pay | Admitting: Internal Medicine

## 2018-06-14 NOTE — Telephone Encounter (Signed)
Spoke with pt who states that he has a monitor that alerts [employer] of his weight. He reports he keeping getting calls from them to report he has had an 11 lbs weight gain this month. Pt states he does have some right leg edema but knows the proper steps to decrease it. He states he has started wearing his compression hose yesterday. He denies any SOB and reports he has been taking his medication as prescribed. He states he feels that the weight gain is related to his eating habits and knows he eats more than he should. Pt advised to watch intake. Pt verbalized understanding and states he will call in next week with updates on his right leg edema.

## 2018-06-14 NOTE — Telephone Encounter (Signed)
New Message:       Pt c/o swelling: STAT is pt has developed SOB within 24 hours  1) How much weight have you gained and in what time span? 11bs since 6/3  2) If swelling, where is the swelling located? Rt leg  3) Are you currently taking a fluid pill?not sure [employer] calling for pt   4) Are you currently SOB? No  5) Do you have a log of your daily weights (if so, list)? Pt is currently 272  6) Have you gained 3 pounds in a day or 5 pounds in a week? No  7) Have you traveled recently? no

## 2018-06-14 NOTE — Telephone Encounter (Signed)
Thanks .[name] Weight gain is probably edema. He needs to increase his diuretics or he is likely to end up in the hospital again.  Dr. Lemmie Evens

## 2018-06-15 NOTE — Telephone Encounter (Signed)
Spoke with pt and advised of Dr. Lysbeth Penner recommendation. Pt verbalized understanding.

## 2018-06-27 ENCOUNTER — Emergency Department (HOSPITAL_COMMUNITY): Payer: Medicare Other

## 2018-06-27 ENCOUNTER — Encounter (HOSPITAL_COMMUNITY): Payer: Self-pay | Admitting: Emergency Medicine

## 2018-06-27 ENCOUNTER — Other Ambulatory Visit: Payer: Self-pay

## 2018-06-27 ENCOUNTER — Emergency Department (HOSPITAL_COMMUNITY)
Admission: EM | Admit: 2018-06-27 | Discharge: 2018-06-27 | Disposition: A | Discharge status: Home / Self Care | Payer: Medicare Other | Attending: Emergency Medicine | Admitting: Emergency Medicine

## 2018-06-27 DIAGNOSIS — Z794 Long term (current) use of insulin: Secondary | ICD-10-CM | POA: Insufficient documentation

## 2018-06-27 DIAGNOSIS — I5042 Chronic combined systolic (congestive) and diastolic (congestive) heart failure: Secondary | ICD-10-CM | POA: Diagnosis not present

## 2018-06-27 DIAGNOSIS — E119 Type 2 diabetes mellitus without complications: Secondary | ICD-10-CM | POA: Insufficient documentation

## 2018-06-27 DIAGNOSIS — Z79899 Other long term (current) drug therapy: Secondary | ICD-10-CM | POA: Insufficient documentation

## 2018-06-27 DIAGNOSIS — Z7982 Long term (current) use of aspirin: Secondary | ICD-10-CM | POA: Diagnosis not present

## 2018-06-27 DIAGNOSIS — I110 Hypertensive heart disease with heart failure: Secondary | ICD-10-CM | POA: Insufficient documentation

## 2018-06-27 DIAGNOSIS — M25512 Pain in left shoulder: Secondary | ICD-10-CM | POA: Insufficient documentation

## 2018-06-27 DIAGNOSIS — Z95810 Presence of automatic (implantable) cardiac defibrillator: Secondary | ICD-10-CM | POA: Insufficient documentation

## 2018-06-27 DIAGNOSIS — W19XXXA Unspecified fall, initial encounter: Secondary | ICD-10-CM

## 2018-06-27 DIAGNOSIS — R0789 Other chest pain: Secondary | ICD-10-CM | POA: Insufficient documentation

## 2018-06-27 MED ORDER — ACETAMINOPHEN 325 MG PO TABS
650.0000 mg | ORAL_TABLET | Freq: Once | ORAL | Status: AC
  Administered 2018-06-27: 650 mg via ORAL
  Filled 2018-06-27: qty 2

## 2018-06-27 NOTE — ED Triage Notes (Signed)
Pt. Stated, I rolled off the bed this morning onto the floor, My left shoulder is hurting. Denies any other symptoms.

## 2018-06-27 NOTE — Discharge Instructions (Addendum)
Please read and follow all provided instructions.  You have been seen today for shoulder pain after a fall.   Tests performed today include: An x-ray of the affected areas - does NOT show any new broken bones or dislocations.  Vital signs. See below for your results today.   We suspect this to be muscle related soreness from the fall.  We recommend you take Tylenol per over-the-counter dosing instructions.  Follow-up with your primary care provider within 5 days for reevaluation.  Return to the ER for new or worsening symptoms including but not limited to worsening pain, trouble breathing, numbness, weakness, additional falls, or any other concerns that you may have.  Your vital signs today were: BP (!) 151/90 (BP Location: Right Arm)    Pulse 81    Temp 97.7 F (36.5 C) (Oral)    Resp 16    Ht 5\' 3"  (1.6 m)    Wt 123.8 kg (273 lb)    SpO2 97%    BMI 48.36 kg/m  If your blood pressure (BP) was elevated above 135/85 this visit, please have this repeated by your doctor within one month. ---------------

## 2018-06-27 NOTE — ED Notes (Signed)
Pt states that he rolled out of the bed this morning, denies LOC, denies hitting his head.

## 2018-06-27 NOTE — ED Provider Notes (Signed)
Patient placed in Quick Look pathway, seen and evaluated   Chief Complaint: fall  HPI:   [place] out of bed this AM, injured L shoulder  ROS: no syncope (one)  Physical Exam:   Gen: No distress  Neuro: Awake and Alert  Skin: Warm    Focused Exam: L shoulder: tenderness to lateral deltoid, decreased shoulder flexion and extension, no deformity   Initiation of care has begun. The patient has been counseled on the process, plan, and necessity for staying for the completion/evaluation, and the remainder of the medical screening examination    Domenic Moras, Hershal Coria 06/27/18 [place], MD 06/28/18 [phone]

## 2018-06-27 NOTE — ED Notes (Signed)
Patient transported to X-ray 

## 2018-06-27 NOTE — ED Provider Notes (Signed)
[facility] EMERGENCY DEPARTMENT Provider Note   CSN: [id number] Arrival date & time: 06/27/18  1132     History   Chief Complaint Chief Complaint  Patient presents with  . Fall  . Shoulder Pain    HPI Jeffery Nelson is a 73 y.o. male with a hx of HF with AICD in place, hypercholesterolemia, sleep apnea, and T2DM who presents to the ED s/p mechanical fall this morning complaining of L shoulder and L sided rib pain. Patient was not having discomfort prior to fall. States he was laying on his bed and rolled over accidentally falling off the bed. No head injury or LOC. Fell somewhat on L side/prone position. He was able to get up on his own. He is having pain to L shoulder/L ribs. Rates pain a 6/10 in severity, worse with movement of LUE, no alleviating factors. Denies neck pain, back pain, numbness, or weakness. Not currently on blood thinners.   HPI  Past Medical History:  Diagnosis Date  . AICD (automatic cardioverter/defibrillator) present    a. 06/21/2016 SJM single lead AICD (ser # [id number]).  . Benign neoplasm of colon   . Chest pain    From rib fractures  . Chronic combined systolic and diastolic CHF (congestive heart failure) ([place])    a. 01/2016 Echo: EF 20-25%, diff HK; b. 05/2016 Echo: EF 35-40%, diff HK, gr1 DD, diff HK, mild MR, mod dil LA/RA, mod TR, PASP 65mmHg.  [name] Gout   . Hypertensive heart disease with heart failure ([place])   . Lower extremity edema   . NICM (nonischemic cardiomyopathy) ([place])     01/2016 Echo: EF 20-25%, b. 01/2016 Cath: LM nl, LAD min irregs, D1 50ost, LCX min irregs, RCA min irregs, RPDA/RPL1 nl;  c. 05/2016 Echo: EF 35-40%;  d. 06/2016 s/p SJM single lead AICD (ser # [id number])..  . Non-obstructive CAD    a. 01/2016 Cath: LM nl, LAD min irregs, D1 50ost, LCX min irregs, RCA min irregs, RPDA/RPL1 nl.  . Polyneuropathy in diabetes(357.2)   . Psychosexual dysfunction with inhibited sexual excitement   . Pure hypercholesterolemia   .  Restless legs syndrome (RLS)   . Sleep apnea    "probably" (06/21/2016)  . Sleep arousal disorder   . Syncope    a. Presumed to be 2/2 VT in setting of NICM-->s/p single lead SJM AICD in 06/2016.  . Type II diabetes mellitus ([place])   . Unspecified venous (peripheral) insufficiency     Patient Active Problem List   Diagnosis Date Noted  . Physical deconditioning 02/28/2018  . Venous stasis dermatitis of both lower extremities 02/17/2018  . CHF (congestive heart failure) ([place]) 02/17/2018  . Cellulitis of right leg 02/17/2018  . Hx of medication noncompliance 08/23/2017  . Encounter for long-term (current) use of medications 04/01/2017  . Syncope   . Type II diabetes mellitus ([place])   . Non-obstructive CAD   . NICM (nonischemic cardiomyopathy) ([place])   . Hypertensive heart disease with heart failure ([place])   . Chronic combined systolic and diastolic CHF, NYHA class 2 ([place])   . AICD (automatic cardioverter/defibrillator) present   . Acute on chronic systolic congestive heart failure ([place]) 06/21/2016  . Syncope and collapse 06/03/2016  . Non-ischemic cardiomyopathy ([place]) 06/03/2016  . Candida rash of groin 02/12/2016  . Cardiomyopathy ([place]) 02/04/2016  . Swelling of lower extremity 01/18/2016  . Shortness of breath 01/18/2016  . Edema 10/08/2014  . Intertrochanteric fx-closed ([place]) 10/04/2014  . S/P total  hip arthroplasty 10/04/2014  . Diabetes mellitus type 2, controlled, with complications ([place])   . Pure hypercholesterolemia   . Essential hypertension   . Polyneuropathy in diabetes(357.2)   . Restless legs syndrome (RLS)   . Hip fracture ([place]) 09/21/2014  . Morbid obesity ([place]) 10/04/2013  . History of shingles 10/04/2013  . Varicose veins 10/04/2013  . Needs sleep apnea assessment 10/04/2013    Past Surgical History:  Procedure Laterality Date  . CARDIAC CATHETERIZATION N/A 02/12/2016   Procedure: Right/Left Heart Cath and Coronary Angiography;  Surgeon: Pixie Casino, MD;   Location: [facility];  Service: Cardiovascular;  Laterality: N/A;  . CATARACT EXTRACTION W/ INTRAOCULAR LENS  IMPLANT, BILATERAL Bilateral 2009  . EP IMPLANTABLE DEVICE N/A 06/21/2016   Procedure: ICD Implant;  Surgeon: Evans Lance, MD;  Location: [facility];  Service: Cardiovascular;  Laterality: N/A;  . FIXATION KYPHOPLASTY LUMBAR SPINE  2008  . FRACTURE SURGERY    . INTRAMEDULLARY (IM) NAIL INTERTROCHANTERIC Left 09/21/2014   Procedure: left hip intertroch fracture;  Surgeon: [name], MD;  Location: [facility];  Service: Orthopedics;  Laterality: Left;  . LOWER EXTREMITY VENOUS DOPPLER  09/04/2012   Mild valvular insufficiency in the R Common Femoral vein w/ 1.6 sec of duration of refliux  . TRANSTHORACIC ECHOCARDIOGRAM  09/04/2012   EF >55%, normal        Home Medications    Prior to Admission medications   Medication Sig Start Date End Date Taking? Authorizing Provider  aspirin EC 81 MG tablet Take 81 mg by mouth daily.    [provider]  benazepril (LOTENSIN) 20 MG tablet Take 1 tablet (20 mg total) by mouth daily. 05/12/18   Hilty, Nadean Corwin, MD  carvedilol (COREG) 3.125 MG tablet Take 1 tablet (3.125 mg total) by mouth 2 (two) times daily with a meal. 05/04/18   Hilty, Nadean Corwin, MD  FLUoxetine (PROZAC) 20 MG tablet Take 20 mg by mouth daily.    [provider]  furosemide (LASIX) 40 MG tablet Take 2 tablets (80 mg total) by mouth 2 (two) times daily. 02/22/18   Kathie Dike, MD  HUMALOG MIX 75/25 (75-25) 100 UNIT/ML SUSP injection Inject 30-35 Units as directed 3 (three) times daily with meals. Inject 70 units at breakfast and dinner, and 55 units at lunch time 05/03/15   [provider]  potassium chloride SA (K-DUR,KLOR-CON) 20 MEQ tablet TAKE 1 TABLET BY MOUTH TWICE DAILY 06/12/18   Hilty, Nadean Corwin, MD  rOPINIRole (REQUIP) 4 MG tablet Take 4 mg by mouth daily.     [provider]  rosuvastatin (CRESTOR) 20 MG tablet Take 1  tablet (20 mg total) by mouth daily. 02/23/18   Kathie Dike, MD  vitamin B-12 (CYANOCOBALAMIN) 1000 MCG tablet Take 1,000 mcg by mouth daily.    [provider]    Family History Family History  Problem Relation Age of Onset  . Heart disease Mother   . Heart disease Father   . Heart disease Maternal Grandmother   . Heart disease Maternal Grandfather   . Stroke Paternal Grandmother   . Cancer Paternal Grandfather        Skin cancer  . Diabetes Brother     Social History Social History   Tobacco Use  . Smoking status: Never Smoker  . Smokeless tobacco: Never Used  Substance Use Topics  . Alcohol use: No  . Drug use: No     Allergies  Doxycycline; Lipitor [atorvastatin]; and Lyrica [pregabalin]   Review of Systems Review of Systems  Constitutional: Negative for chills and fever.  Respiratory: Negative for shortness of breath.   Musculoskeletal: Positive for arthralgias (L shoulder). Negative for back pain and neck pain.       Positive for L sided rib pain  Skin: Negative for wound.  Neurological: Negative for syncope, weakness and numbness.   Physical Exam Updated Vital Signs BP (!) 151/90 (BP Location: Right Arm)   Pulse 81   Temp 97.7 F (36.5 C) (Oral)   Resp 16   Ht 5\' 3"  (1.6 m)   Wt 123.8 kg (273 lb)   SpO2 97%   BMI 48.36 kg/m   Physical Exam  Constitutional: He appears well-developed and well-nourished.  Non-toxic appearance. No distress.  Obese  HENT:  Head: Normocephalic and atraumatic. Head is without raccoon's eyes and without Battle's sign.  Right Ear: No drainage. No hemotympanum.  Left Ear: No drainage. No hemotympanum.  Nose: Nose normal.  Mouth/Throat: Uvula is midline.  Eyes: Pupils are equal, round, and reactive to light. Conjunctivae and EOM are normal. Right eye exhibits no discharge. Left eye exhibits no discharge.  Neck: Normal range of motion. Neck supple. No spinous process tenderness and no muscular tenderness  present.  Cardiovascular: Normal rate and regular rhythm.  No murmur heard. Pulses:      Radial pulses are 2+ on the right side, and 2+ on the left side.  Pulmonary/Chest: Effort normal and breath sounds normal. No respiratory distress. He has no wheezes. He has no rhonchi. He has no rales. He exhibits tenderness (Left anterior chest wall and lateral ribs). He exhibits no laceration, no crepitus, no edema, no deformity, no swelling and no retraction.  No bruising to chest wall or abdomen.   Abdominal: Soft. He exhibits no distension. There is no tenderness.  Musculoskeletal:  No obvious deformity appreciable swelling, erythema, ecchymosis, or open wounds Upper extremities: Patient has normal range of motion to bilateral upper extremities, with the exception of left shoulder flexion and abduction being somewhat limited, able to for each of these motions past 90 degrees, limited secondary to discomfort.  Patient is tender to palpation over anterior left shoulder as well as pectoralis major, no point/focal bony tenderness.  No palpable joint instability.  Upper extremities otherwise nontender. Back: No midline tenderness Lower extremity: Normal range of motion.  Nontender.  Neurological: He is alert.  Clear speech.  Sensation grossly intact upper and lower extremities bilaterally.  5 out of 5 symmetric grip strength.  5 out of 5 strength plantar dorsiflexion bilaterally gait is intact.  Skin: Skin is warm and dry. No rash noted.  Psychiatric: He has a normal mood and affect. His behavior is normal.  Nursing note and vitals reviewed.    ED Treatments / Results  Labs (all labs ordered are listed, but only abnormal results are displayed) Labs Reviewed - No data to display  EKG None  Radiology Dg Ribs Unilateral W/chest Left  Result Date: 06/27/2018 CLINICAL DATA:  [place] out of bed landing on the anterior left shoulder with pain EXAM: LEFT RIBS AND CHEST - 3+ VIEW COMPARISON:  Portable chest  x-ray of 02/17/2018 FINDINGS: No active infiltrate or effusion is seen. Mild cardiomegaly is stable and AICD lead remains. Left rib detail films show old fractures of the right anterolateral 5, 6, seventh, and eighth ribs. No new left rib fracture is seen. IMPRESSION: 1. No active lung disease.  Stable mild cardiomegaly.  2. Old left rib fractures.  No acute left rib fracture is seen. Electronically Signed   By: Ivar Drape M.D.   On: 06/27/2018 14:17   Dg Shoulder Left  Result Date: 06/27/2018 CLINICAL DATA:  Pain after fall EXAM: LEFT SHOULDER - 2+ VIEW COMPARISON:  None. FINDINGS: There is no evidence of fracture or dislocation. There is no evidence of arthropathy or other focal bone abnormality. Soft tissues are unremarkable. IMPRESSION: Negative. Electronically Signed   By: Dorise Bullion III M.D   On: 06/27/2018 13:02    Procedures Procedures (including critical care time)  Medications Ordered in ED Medications  acetaminophen (TYLENOL) tablet 650 mg (has no administration in time range)     Initial Impression / Assessment and Plan / ED Course  I have reviewed the triage vital signs and the nursing notes.  Pertinent labs & imaging results that were available during my care of the patient were reviewed by me and considered in my medical decision making (see chart for details).   Patient presents to the ED s/p mechanical fall with complaints of L shoulder/L rib pain. Patient nontoxic appearing, in no apparent distress, vitals WNL with the exception of elevated BP, doubt HTN emergency, normalized with repeat vitals. Patient without signs of serious head/neck/back injury. L shoulder and L anterior/lateral chest wall tender to palpation without overlying deformity/wounds/ecchymosis. Xrays obtained and negative for acute fracture/dislocation, no evidence of pneumothorax. Patient NVI distally. No evidence of respiratory distress. Suspect muscle related soreness s/p fall. Recommended tylenol and PCP  follow up. I discussed results, treatment plan, need for PCP follow-up, and return precautions with the patient. Provided opportunity for questions, patient confirmed understanding and is in agreement with plan.   Vitals:   06/27/18 1217 06/27/18 1220 06/27/18 1509 06/27/18 1511  BP: (!) 151/90  136/78   Pulse: 81   80  Resp: 16     Temp: 97.7 F (36.5 C)     TempSrc: Oral     SpO2: 97%   95%  Weight:  123.8 kg (273 lb)    Height:  5\' 3"  (1.6 m)       Final Clinical Impressions(s) / ED Diagnoses   Final diagnoses:  Fall, initial encounter    ED Discharge Orders    None       Amaryllis Dyke, PA-C 06/27/18 1717    Davonna Belling, MD 06/28/18 [phone]

## 2018-06-29 ENCOUNTER — Telehealth: Payer: Self-pay | Admitting: *Deleted

## 2018-06-29 ENCOUNTER — Telehealth: Payer: Self-pay | Admitting: Internal Medicine

## 2018-06-29 MED ORDER — SACUBITRIL-VALSARTAN 49-51 MG PO TABS: 1.0000 | ORAL_TABLET | Freq: Two times a day (BID) | ORAL | 3 refills | Status: DC

## 2018-06-29 NOTE — Telephone Encounter (Signed)
Called LVMTCB to discuss.

## 2018-06-29 NOTE — Telephone Encounter (Signed)
Called patient, spoke with him he stated that he stopped the benazepril back in June when advised too, and he had samples of Entresto to last for a few more days and needed RX sent in. Advised patient I was sending in [place]. Patient verbalized understanding and had no questions or concerns.

## 2018-06-29 NOTE — Telephone Encounter (Signed)
Pt calling   Pt feel out of bed Tuesday and is bruised and feels like his defib isn't in the correct place. Please call pt.

## 2018-06-29 NOTE — Telephone Encounter (Signed)
Instructed pt to send a manual transmission w/ his home monitor. Once the transmission is received a Chief Operating Officer will review and call back with the results.

## 2018-06-29 NOTE — Telephone Encounter (Signed)
Spoke with Eliezer Lofts, RN  Agreed to go ahead and send in the [place], awaiting to speak to patient before submitting to advised of instructions before sending RX to pharmacy.  LVM

## 2018-06-29 NOTE — Telephone Encounter (Signed)
Remote transmission has not been received. Called patient to see if he tried to send. Patient states that he tried to send remote. Instructed patient on how to send manual transmission. Patient verbalized understanding.  Will call patient once remote is received.

## 2018-06-29 NOTE — Telephone Encounter (Signed)
Patient left a msg on the refill vm stating that Dr Debara Pickett wanted him to start entresto but he was not provided with an rx. He would like a call back at [phone] to discuss. Thanks, MI

## 2018-06-29 NOTE — Telephone Encounter (Signed)
Remote transmission received. Presenting rhythm: Vs. (1) "VT-NS" episode x 4sec- no morphology change, likely AT. Thoracic impedance below baseline. Stable lead measurements. Normal device function.   Called patient to inform him about remote results. I told patient that his device is functioning normally. I told him that if he fell on the device site then he may have a some discomfort in the area which will gradually get better. I also told him that if he has some bruising then it will just gradually go away. Patient verbalized understanding.  I also told patient that he appeared to be retaining fluid per his corvue measurement. Patient states that he has noticed that he's been retaining fluid, and has been taking extra Lasix bc of it. I instructed patient to continue to take his Lasix as Rx'd and to restrict his salt intake. I also told patient that he may benefit from following in [facility] clinic with Sharman Cheek, RN. Patient is very interested in the service. I told patient that I would send her his information and she would call him within the next few weeks for an intro into the program. I told patient that in the meantime he would need to continue to weigh himself daily and to call if he gains 2lbs in a day or 5lbs in a week. Patient verbalized understanding.  Will forward to Sharman Cheek, Therapist, sports.

## 2018-07-04 ENCOUNTER — Ambulatory Visit (INDEPENDENT_AMBULATORY_CARE_PROVIDER_SITE_OTHER): Payer: Medicare Other

## 2018-07-04 ENCOUNTER — Telehealth: Payer: Self-pay

## 2018-07-04 DIAGNOSIS — I5042 Chronic combined systolic (congestive) and diastolic (congestive) heart failure: Secondary | ICD-10-CM

## 2018-07-04 DIAGNOSIS — Z95810 Presence of automatic (implantable) cardiac defibrillator: Secondary | ICD-10-CM | POA: Diagnosis not present

## 2018-07-04 NOTE — Telephone Encounter (Signed)
Attempted ICM intro call and also received remote transmission.  Left .message to return call.

## 2018-07-04 NOTE — Progress Notes (Signed)
EPIC Encounter for ICM Monitoring  Patient Name: Jeffery Nelson is a 72 y.o. male Date: 07/04/2018 Primary Care Physican: [name], MD Primary Cardiologist: Hilty Electrophysiologist: Lovena Le Dry Weight: 278 lbs         1st ICM transmission. Heart Failure questions reviewed, pt has swelling in right foot/leg which is the area that swells when he is accumulating fluid and weight gain of 10 lbs since 05/27/2018.  He reported he did miss a few dosages of Furosemide when he went to ER for fall on 7/9.  He thinks the fall has dislodged the device placement and appointment was scheduled with device clinic 07/06/2018.   Thoracic impedance abnormal suggesting fluid accumulation starting 06/26/2018 and no improvement since he has been taking an extra Furosemide for past 3 days.  Prescribed dosage: Furosemide 40 mg take 2 tablets (80 mg total) twice a day. Potassium 20 mEq 1 tablet daily.     Labs: 03/08/2018 Creatinine 0.88, BUN 27, Potassium 4.4, Sodium 138, EGFR 86-99 02/22/2018 Creatinine 0.96, BUN 28, Potassium 3.8, Sodium 132, EGFR >60  02/21/2018 Creatinine 0.84, BUN 24, Potassium 3.7, Sodium 132, EGFR >60  02/20/2018 Creatinine 0.69, BUN 23, Potassium 4.2, Sodium 133, EGFR >60  02/19/2018 Creatinine 0.74, BUN 27, Potassium 4.4, Sodium 137, EGFR >60  02/18/2018 Creatinine 0.76, BUN 28, Potassium 4.2, Sodium 137, EGFR >60  02/17/2018 Creatinine 0.74, BUN 25, Potassium 4.0, Sodium 136, EGFR >60  A complete set of results can be found in Results Review.  Recommendations:  Discussed limiting salt and fluid intake.  Patient has been taking 1 extra Furosemide 40 mg which = 120 mg in AM and 80 mg in PM for the past 3 days.  Advised will send to Dr Debara Pickett for review and if he makes any further recommendations will call back.    Follow-up plan: ICM clinic phone appointment on 07/10/2018 (manual send) to recheck fluid levels.   Office appointment scheduled 08/04/2018 with Dr. Lovena Le.   Appointment with  device clinic 07/06/18 as requested by patient to be seen.   Copy of ICM check sent to Dr. Lovena Le and Dr Debara Pickett for review and recommendations if needed.   3 month ICM trend: 07/04/2018    1 Year ICM trend:       Rosalene Billings, RN 07/04/2018 12:20 PM

## 2018-07-06 ENCOUNTER — Ambulatory Visit (INDEPENDENT_AMBULATORY_CARE_PROVIDER_SITE_OTHER): Payer: Medicare Other | Admitting: *Deleted

## 2018-07-06 ENCOUNTER — Ambulatory Visit (INDEPENDENT_AMBULATORY_CARE_PROVIDER_SITE_OTHER)
Admission: RE | Admit: 2018-07-06 | Discharge: 2018-07-06 | Disposition: A | Discharge status: Home / Self Care | Payer: Medicare Other | Source: Ambulatory Visit | Attending: Internal Medicine | Admitting: Internal Medicine

## 2018-07-06 DIAGNOSIS — Z95810 Presence of automatic (implantable) cardiac defibrillator: Secondary | ICD-10-CM

## 2018-07-06 DIAGNOSIS — W19XXXD Unspecified fall, subsequent encounter: Secondary | ICD-10-CM

## 2018-07-06 DIAGNOSIS — R221 Localized swelling, mass and lump, neck: Secondary | ICD-10-CM | POA: Diagnosis not present

## 2018-07-06 DIAGNOSIS — I428 Other cardiomyopathies: Secondary | ICD-10-CM

## 2018-07-06 DIAGNOSIS — I5042 Chronic combined systolic (congestive) and diastolic (congestive) heart failure: Secondary | ICD-10-CM

## 2018-07-06 NOTE — Progress Notes (Signed)
Mr. Sherk needs to increase lasix to 80 mg TID - is he taking Entresto?  Dr. Lemmie Evens

## 2018-07-06 NOTE — Progress Notes (Signed)
Jeffery Nelson reports to the Device clinic today for evaluation of ICD pocket s/p fall 06/27/18. Extensive ecchymosis noted from lower neck to abdomen. Device pocket without redness, swelling or drainage. Bruising not extending from area of device. ICD function confirmed via remote transmission x 2. Left upper chest and neck swelling noted, wheezing noted by Dr. Caryl Comes who came in to evaluate swelling. Reviewed x rays obtained 06/27/18, left clavicle appears to be displaced. Dr. Caryl Comes gave a verbal order for a soft tissue neck CT without contrast today. Order placed, Mr. Guard will have a CT Scan in [place] office today.  CT scan completed, left clavicle fracture. Dr. Caryl Comes confirmed [name] starts at 5:30pm today and the patient is agreeable to go for evaluation.

## 2018-07-07 ENCOUNTER — Telehealth: Payer: Self-pay | Admitting: Internal Medicine

## 2018-07-07 MED ORDER — FUROSEMIDE 40 MG PO TABS: ORAL_TABLET | ORAL | 5 refills | Status: DC

## 2018-07-07 NOTE — Telephone Encounter (Signed)
-----   Message from Pixie Casino, MD sent at 07/06/2018 12:54 PM EDT ----- Mr. Oregel appears to be retaining fluid again - was switched to [facility], can you check if he is taking it? Says he is taking extra lasix - if he is on 80 mg BID, then should increase to 80 mg TID for 3-5 days until weight comes down to baseline.  Dr. Lemmie Evens

## 2018-07-07 NOTE — Progress Notes (Signed)
Call to patient.  Advised Dr Debara Pickett ordered to increase Furosemide to 80 mg three times a day.  He reported he received a recent refill and has enough supply on hand at this time.   He confirmed he is taking Entresto as prescribed.  Advised if he has any dizziness, lightheadedness, feeling faint or BP decreases with extra Furosemide dosage then to call back on Monday, 7/22.

## 2018-07-07 NOTE — Telephone Encounter (Signed)
Patient called with MD instructions. He is taking entresto. He is taking lasix 80mg  BID. Advised to increase to 80mg  TID for 3-5 days and monitor weights.

## 2018-07-07 NOTE — Telephone Encounter (Signed)
Pt calling.    Returning call to nurse. Please call pt.

## 2018-07-07 NOTE — Telephone Encounter (Signed)
LMTCB

## 2018-07-10 ENCOUNTER — Ambulatory Visit (INDEPENDENT_AMBULATORY_CARE_PROVIDER_SITE_OTHER): Payer: Medicare Other

## 2018-07-10 DIAGNOSIS — Z95810 Presence of automatic (implantable) cardiac defibrillator: Secondary | ICD-10-CM

## 2018-07-10 DIAGNOSIS — I5042 Chronic combined systolic (congestive) and diastolic (congestive) heart failure: Secondary | ICD-10-CM

## 2018-07-10 NOTE — Progress Notes (Signed)
EPIC Encounter for ICM Monitoring  Patient Name: Jeffery Nelson is a 72 y.o. male Date: 07/10/2018 Primary Care Physican: [name], MD Primary Cardiologist: Hilty Electrophysiologist: Druscilla Brownie Weight:  278 lbs         Heart Failure questions reviewed, pt has lost 10 lbs since taking Furosemide 80 mg TID for last 5 days.  Weight dropped from 288 lbs to 278 lbs.  Unsure of baseline weight but was 266 lbs when he was discharged from hospital in March.    Thoracic impedance returned to normal today after taking Furosemide 80 TID for 3-5 days as ordered by Dr Debara Pickett.   Prescribed dosage: Furosemide 40 mg take 2 tablets (80 mg total) twice a day. Potassium 20 mEq 1 tablet daily.     Labs: 03/08/2018 Creatinine 0.88, BUN 27, Potassium 4.4, Sodium 138, EGFR 86-99 02/22/2018 Creatinine 0.96, BUN 28, Potassium 3.8, Sodium 132, EGFR >60  02/21/2018 Creatinine 0.84, BUN 24, Potassium 3.7, Sodium 132, EGFR >60  02/20/2018 Creatinine 0.69, BUN 23, Potassium 4.2, Sodium 133, EGFR >60  02/19/2018 Creatinine 0.74, BUN 27, Potassium 4.4, Sodium 137, EGFR >60  02/18/2018 Creatinine 0.76, BUN 28, Potassium 4.2, Sodium 137, EGFR >60  02/17/2018 Creatinine 0.74, BUN 25, Potassium 4.0, Sodium 136, EGFR >60  A complete set of results can be found in Results Review.  Recommendations:  He has resumed prescribed dosage of Furosemide 80 mg twice a day.  Advised to limit salt intake.    Encouraged to call back if fluid symptoms return.    Follow-up plan: ICM clinic phone appointment on 07/31/2018.   Office appointment scheduled 08/04/2018 with Dr. Lovena Le.  .   Copy of ICM check sent to Dr. Lovena Le and Dr Debara Pickett for review.   3 month ICM trend: 07/10/2018    1 Year ICM trend:       Rosalene Billings, RN 07/10/2018 10:51 AM

## 2018-07-31 ENCOUNTER — Ambulatory Visit (INDEPENDENT_AMBULATORY_CARE_PROVIDER_SITE_OTHER): Payer: Self-pay

## 2018-07-31 DIAGNOSIS — I5042 Chronic combined systolic (congestive) and diastolic (congestive) heart failure: Secondary | ICD-10-CM

## 2018-07-31 DIAGNOSIS — Z95810 Presence of automatic (implantable) cardiac defibrillator: Secondary | ICD-10-CM

## 2018-07-31 NOTE — Progress Notes (Signed)
EPIC Encounter for ICM Monitoring  Patient Name: Jeffery Nelson is a 72 y.o. male Date: 07/31/2018 Primary Care Physican: [name], MD Primary Cardiologist:Hilty Electrophysiologist:Taylor Dry Weight:270lbs        Heart Failure questions reviewed, pt asymptomatic.   Thoracic impedance normal.  Prescribed dosage: Furosemide40 mg take 2 tablets (80 mg total) twice a day. Potassium 20 mEq 1 tablet daily.  Labs: 03/08/2018 Creatinine0.88, Glory Buff, Potassium4.4, [id number], [id number] 02/22/2018 Creatinine0.96, BUN28, Potassium3.8, Sodium132, EGFR>60  02/21/2018 Creatinine0.84, BUN24, Potassium3.7, Sodium132, EGFR>60  02/20/2018 Creatinine0.69, BUN23, Potassium4.2, [id number], EGFR>60  02/19/2018 Creatinine0.74, BUN27, Potassium4.4, [id number], EGFR>60  02/18/2018 Creatinine0.76, BUN28, Potassium4.2, [id number], EGFR>60  02/17/2018 Creatinine0.74, BUN25, Potassium4.0, Sodium136, EGFR>60 A complete set of results can be found in Results Review.  Recommendations: No changes.  Encouraged to call for fluid symptoms.  Follow-up plan: ICM clinic phone appointment on 09/04/2018.   Office appointment scheduled 08/04/2018 with Dr. Lovena Le.    Copy of ICM check sent to Dr. Lovena Le.   3 month ICM trend: 07/31/2018    1 Year ICM trend:       Rosalene Billings, RN 07/31/2018 3:48 PM

## 2018-08-04 ENCOUNTER — Ambulatory Visit: Payer: Medicare Other | Admitting: Internal Medicine

## 2018-08-04 ENCOUNTER — Encounter: Payer: Self-pay | Admitting: Internal Medicine

## 2018-08-04 VITALS — BP 126/64 | HR 64 | Ht 63.0 in | Wt 282.0 lb

## 2018-08-04 DIAGNOSIS — I10 Essential (primary) hypertension: Secondary | ICD-10-CM

## 2018-08-04 DIAGNOSIS — R55 Syncope and collapse: Secondary | ICD-10-CM

## 2018-08-04 DIAGNOSIS — Z95810 Presence of automatic (implantable) cardiac defibrillator: Secondary | ICD-10-CM

## 2018-08-04 DIAGNOSIS — I5042 Chronic combined systolic (congestive) and diastolic (congestive) heart failure: Secondary | ICD-10-CM | POA: Diagnosis not present

## 2018-08-04 NOTE — Patient Instructions (Signed)
Medication Instructions:  Your physician recommends that you continue on your current medications as directed. Please refer to the Current Medication list given to you today.  Labwork: None ordered.  Testing/Procedures: None ordered.  Follow-Up: Your physician wants you to follow-up in: one year with Dr. Lovena Le.  You will receive a reminder letter in the mail two months in advance. If you don't receive a letter, please call our office to schedule the follow-up appointment.  Remote monitoring is used to monitor your ICD from home. This monitoring reduces the number of office visits required to check your device to one time per year. It allows [place] to keep an eye on the functioning of your device to ensure it is working properly. You are scheduled for a device check from home on 09/04/2018. You may send your transmission at any time that day. If you have a wireless device, the transmission will be sent automatically. After your physician reviews your transmission, you will receive a postcard with your next transmission date.  Any Other Special Instructions Will Be Listed Below (If Applicable).  If you need a refill on your cardiac medications before your next appointment, please call your pharmacy.

## 2018-08-04 NOTE — Progress Notes (Signed)
HPI Jeffery Nelson returns today for ongoing evaluation of syncope, LV dysfunction, chronic systolic heart failure and is s/p ICD implantation. He has been stable in the interim with no ICD shock. No palpitations. He remains unable to lose weight. No edema. He has not had syncope. He has chronic woody edema. He has class 2 dyspnea.  Allergies  Allergen Reactions  . Doxycycline     RECTAL BLEEDING  . Lipitor [Atorvastatin] Swelling  . Lyrica [Pregabalin] Swelling     Current Outpatient Medications  Medication Sig Dispense Refill  . aspirin EC 81 MG tablet Take 81 mg by mouth daily.    . carvedilol (COREG) 3.125 MG tablet Take 1 tablet (3.125 mg total) by mouth 2 (two) times daily with a meal. 180 tablet 2  . FLUoxetine (PROZAC) 20 MG tablet Take 20 mg by mouth daily.    . furosemide (LASIX) 40 MG tablet Take 80 mg by mouth 2 (two) times daily.    [name] HUMALOG MIX 75/25 (75-25) 100 UNIT/ML SUSP injection Inject 30-35 Units as directed 3 (three) times daily with meals. Inject 70 units at breakfast and dinner, and 55 units at lunch time  2  . potassium chloride SA (K-DUR,KLOR-CON) 20 MEQ tablet TAKE 1 TABLET BY MOUTH TWICE DAILY 180 tablet 3  . rOPINIRole (REQUIP) 4 MG tablet Take 4 mg by mouth daily.     . rosuvastatin (CRESTOR) 20 MG tablet Take 1 tablet (20 mg total) by mouth daily. 30 tablet 0  . sacubitril-valsartan (ENTRESTO) 49-51 MG Take 1 tablet by mouth 2 (two) times daily. 60 tablet 3  . vitamin B-12 (CYANOCOBALAMIN) 1000 MCG tablet Take 1,000 mcg by mouth daily.     No current facility-administered medications for this visit.      Past Medical History:  Diagnosis Date  . Acute on chronic systolic congestive heart failure ([place]) 06/21/2016  . AICD (automatic cardioverter/defibrillator) present    a. 06/21/2016 SJM single lead AICD (ser # [id number]).  . Benign neoplasm of colon   . Candida rash of groin 02/12/2016  . Cardiomyopathy ([place]) 02/04/2016  . Cellulitis of right leg  02/17/2018  . Chest pain    From rib fractures  . CHF (congestive heart failure) ([place]) 02/17/2018  . Chronic combined systolic and diastolic CHF (congestive heart failure) ([place])    a. 01/2016 Echo: EF 20-25%, diff HK; b. 05/2016 Echo: EF 35-40%, diff HK, gr1 DD, diff HK, mild MR, mod dil LA/RA, mod TR, PASP 58mmHg.  [name] Chronic combined systolic and diastolic CHF, NYHA class 2 ([place])    a. 01/2016 Echo: EF 20-25%, diff HK; b. 05/2016 Echo: EF 35-40%, diff HK, gr1 DD, diff HK, mild MR, mod dil LA/RA, mod TR, PASP 24mmHg.  . Diabetes mellitus type 2, controlled, with complications ([place])   . Edema 10/08/2014  . Encounter for long-term (current) use of medications 04/01/2017  . Essential hypertension   . Gout   . Hip fracture ([place]) 09/21/2014  . History of shingles 10/04/2013  . Hx of medication noncompliance 08/23/2017  . Hypertensive heart disease with heart failure ([place])   . Intertrochanteric fx-closed ([place]) 10/04/2014  . Lower extremity edema   . Morbid obesity ([place]) 10/04/2013  . Needs sleep apnea assessment 10/04/2013   Snoring, obesity, daytime somnolence    . NICM (nonischemic cardiomyopathy) ([place])     01/2016 Echo: EF 20-25%, b. 01/2016 Cath: LM nl, LAD min irregs, D1 50ost, LCX min irregs, RCA min irregs, RPDA/RPL1 nl;  c. 05/2016 Echo: EF 35-40%;  d. 06/2016 s/p SJM single lead AICD (ser # [id number])..  . Non-ischemic cardiomyopathy ([place]) 06/03/2016  . Non-obstructive CAD    a. 01/2016 Cath: LM nl, LAD min irregs, D1 50ost, LCX min irregs, RCA min irregs, RPDA/RPL1 nl.  . Physical deconditioning 02/28/2018  . Polyneuropathy in diabetes(357.2)   . Psychosexual dysfunction with inhibited sexual excitement   . Pure hypercholesterolemia   . Restless legs syndrome (RLS)   . 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 Shortness of breath 01/18/2016  . Sleep apnea    "probably" (06/21/2016)  . Sleep arousal disorder   . Swelling of lower extremity 01/18/2016  . Syncope    a. Presumed to be 2/2 VT  in setting of NICM-->s/p single lead SJM AICD in 06/2016.  [name] Syncope and collapse 06/03/2016  . Type II diabetes mellitus ([place])   . Unspecified venous (peripheral) insufficiency   . Varicose veins 10/04/2013  . Venous stasis dermatitis of both lower extremities 02/17/2018    ROS:   All systems reviewed and negative except as noted in the HPI.   Past Surgical History:  Procedure Laterality Date  . CARDIAC CATHETERIZATION N/A 02/12/2016   Procedure: Right/Left Heart Cath and Coronary Angiography;  Surgeon: Pixie Casino, MD;  Location: [facility];  Service: Cardiovascular;  Laterality: N/A;  . CATARACT EXTRACTION W/ INTRAOCULAR LENS  IMPLANT, BILATERAL Bilateral 2009  . EP IMPLANTABLE DEVICE N/A 06/21/2016   Procedure: ICD Implant;  Surgeon: Evans Lance, MD;  Location: [facility];  Service: Cardiovascular;  Laterality: N/A;  . FIXATION KYPHOPLASTY LUMBAR SPINE  2008  . FRACTURE SURGERY    . INTRAMEDULLARY (IM) NAIL INTERTROCHANTERIC Left 09/21/2014   Procedure: left hip intertroch fracture;  Surgeon: [name], MD;  Location: [facility];  Service: Orthopedics;  Laterality: Left;  . LOWER EXTREMITY VENOUS DOPPLER  09/04/2012   Mild valvular insufficiency in the R Common Femoral vein w/ 1.6 sec of duration of refliux  . TRANSTHORACIC ECHOCARDIOGRAM  09/04/2012   EF >55%, normal     Family History  Problem Relation Age of Onset  . Heart disease Mother   . Heart disease Father   . Heart disease Maternal Grandmother   . Heart disease Maternal Grandfather   . Stroke Paternal Grandmother   . Cancer Paternal Grandfather        Skin cancer  . Diabetes Brother      Social History   Socioeconomic History  . Marital status: Widowed    Spouse name: Not on file  . Number of children: Not on file  . Years of education: Not on file  . Highest education level: Not on file  Occupational History  . Not on file  Social Needs  . Financial resource strain: Not on file  . Food  insecurity:    Worry: Not on file    Inability: Not on file  . Transportation needs:    Medical: Not on file    Non-medical: Not on file  Tobacco Use  . Smoking status: Never Smoker  . Smokeless tobacco: Never Used  Substance and Sexual Activity  . Alcohol use: No  . Drug use: No  . Sexual activity: Yes  Lifestyle  . Physical activity:    Days per week: Not on file    Minutes per session: Not on file  . Stress: Not on file  Relationships  . Social connections:  Talks on phone: Not on file    Gets together: Not on file    Attends religious service: Not on file    Active member of club or organization: Not on file    Attends meetings of clubs or organizations: Not on file    Relationship status: Not on file  . Intimate partner violence:    Fear of current or ex partner: Not on file    Emotionally abused: Not on file    Physically abused: Not on file    Forced sexual activity: Not on file  Other Topics Concern  . Not on file  Social History Narrative  . Not on file     BP 126/64   Pulse 64   Ht 5\' 3"  (1.6 m)   Wt 282 lb (127.9 kg)   BMI 49.95 kg/m   Physical Exam:  stable appearing 72 yo man, morbidly obese, NAD HEENT: Unremarkable Neck:  6 cm JVD, no thyromegally Lymphatics:  No adenopathy Back:  No CVA tenderness Lungs:  Clear with no wheezes HEART:  Regular rate rhythm, no murmurs, no rubs, no clicks Abd:  soft, positive bowel sounds, no organomegally, no rebound, no guarding Ext:  2 plus pulses, no edema, no cyanosis, no clubbing Skin:  No rashes no nodules Neuro:  CN II through XII intact, motor grossly intact  EKG - None  DEVICE  Normal device function.  See PaceArt for details.   Assess/Plan: 1. Chronic systolic heart failure - his symptoms are class 3. He is sedentary. He is encouraged to maintain a low sodium diet. 2. ICD - his [employer] device is working normally. 3. Obesity - he is encouraged to lose weight.   Jeffery Nelson.D.

## 2018-08-05 LAB — CUP PACEART INCLINIC DEVICE CHECK
Date Time Interrogation Session: 20190816153257
HIGH POWER IMPEDANCE MEASURED VALUE: 72 Ohm
Implantable Lead Model: 7122
Implantable Pulse Generator Implant Date: 20170703
Lead Channel Impedance Value: 487.5 Ohm
Lead Channel Pacing Threshold Pulse Width: 0.5 ms
Lead Channel Sensing Intrinsic Amplitude: 12 mV
Lead Channel Setting Sensing Sensitivity: 0.5 mV
MDC IDC LEAD IMPLANT DT: 20170703
MDC IDC LEAD LOCATION: 753860
MDC IDC MSMT BATTERY REMAINING LONGEVITY: 82 mo
MDC IDC MSMT LEADCHNL RV PACING THRESHOLD AMPLITUDE: 1.25 V
MDC IDC PG SERIAL: 7372545
MDC IDC SET LEADCHNL RV PACING AMPLITUDE: 2.5 V
MDC IDC SET LEADCHNL RV PACING PULSEWIDTH: 0.5 ms
MDC IDC STAT BRADY RV PERCENT PACED: 0.01 %

## 2018-09-04 ENCOUNTER — Telehealth: Payer: Self-pay | Admitting: Cardiology

## 2018-09-04 NOTE — Telephone Encounter (Signed)
LMOVM reminding pt to send remote transmission.   

## 2018-09-05 ENCOUNTER — Ambulatory Visit (INDEPENDENT_AMBULATORY_CARE_PROVIDER_SITE_OTHER): Payer: Medicare Other | Admitting: Orthopedic Surgery

## 2018-09-05 NOTE — Progress Notes (Signed)
No ICM remote transmission received for 08/31/2018 and next ICM transmission scheduled for 09/28/2018.

## 2018-09-25 ENCOUNTER — Ambulatory Visit (INDEPENDENT_AMBULATORY_CARE_PROVIDER_SITE_OTHER): Payer: Medicare Other | Admitting: Orthopedic Surgery

## 2018-09-28 ENCOUNTER — Ambulatory Visit (INDEPENDENT_AMBULATORY_CARE_PROVIDER_SITE_OTHER): Payer: Medicare Other | Admitting: Orthopedic Surgery

## 2018-09-28 ENCOUNTER — Encounter (INDEPENDENT_AMBULATORY_CARE_PROVIDER_SITE_OTHER): Payer: Self-pay | Admitting: Orthopedic Surgery

## 2018-09-28 ENCOUNTER — Ambulatory Visit (INDEPENDENT_AMBULATORY_CARE_PROVIDER_SITE_OTHER): Payer: Self-pay

## 2018-09-28 ENCOUNTER — Telehealth: Payer: Self-pay

## 2018-09-28 VITALS — Ht 63.0 in | Wt 282.0 lb

## 2018-09-28 DIAGNOSIS — Z6841 Body Mass Index (BMI) 40.0 and over, adult: Secondary | ICD-10-CM

## 2018-09-28 DIAGNOSIS — M79672 Pain in left foot: Secondary | ICD-10-CM | POA: Diagnosis not present

## 2018-09-28 DIAGNOSIS — E1142 Type 2 diabetes mellitus with diabetic polyneuropathy: Secondary | ICD-10-CM

## 2018-09-28 DIAGNOSIS — L97521 Non-pressure chronic ulcer of other part of left foot limited to breakdown of skin: Secondary | ICD-10-CM

## 2018-09-28 DIAGNOSIS — L97919 Non-pressure chronic ulcer of unspecified part of right lower leg with unspecified severity: Secondary | ICD-10-CM

## 2018-09-28 DIAGNOSIS — L97929 Non-pressure chronic ulcer of unspecified part of left lower leg with unspecified severity: Secondary | ICD-10-CM

## 2018-09-28 DIAGNOSIS — I87333 Chronic venous hypertension (idiopathic) with ulcer and inflammation of bilateral lower extremity: Secondary | ICD-10-CM

## 2018-09-28 NOTE — Telephone Encounter (Signed)
LMOVM reminding pt to send remote transmission.   

## 2018-09-28 NOTE — Progress Notes (Signed)
Office Visit Note   Patient: Jeffery Nelson           Date of Birth: 1946/05/11           MRN: [id number] Visit Date: 09/28/2018              Requested by: Renette Butters, MD [address] [place], [place] [id number] PCP: [name], MD  Chief Complaint  Patient presents with  . Left Foot - Pain  . Right Foot - Pain      HPI: Patient is a 72 year old gentleman with multiple medical problems he has chronic venous insufficiency of both legs he has type 2 diabetes he has instability with pronation and valgus of the right foot with possible foreign body in the left foot.  Patient states that he care is seen lamp months ago.  Assessment & Plan: Visit Diagnoses:  1. Left foot pain   2. Idiopathic chronic venous hypertension of both lower extremities with ulcer and inflammation ([place])   3. Ulcer of left foot, limited to breakdown of skin ([place])     Plan: Patient was recommended to go to 1 of the medical supply store was for compression stockings discussed that if this does not provide sufficient reduction and swelling we would need to consider compression wraps.  Patient was given a prescription for [employer] for extra-depth shoes custom orthotics and a double upright brace on the right to stabilize the pronated valgus hindfoot with instability.  Follow-Up Instructions: Return if symptoms worsen or fail to improve.   Ortho Exam  Patient is alert, oriented, no adenopathy, well-dressed, normal affect, normal respiratory effort. Examination patient has a palpable pulse bilaterally.  Patient has brawny skin color changes in both lower extremities with thick discolored skin with venous stasis ulcers that are superficial.  There is no cellulitis no signs of infection there is pitting edema up to the tibial tubercle.  Patient has a pronated valgus collapse of the left foot is basically walking on the side of his foot there is essentially no subtalar or ankle motion.   Examination of the left foot patient has a few Waggoner grade 1 ulcers.  After informed consent a 10 blade knife was used to debride the skin and soft tissue back to healthy viable tissue the 2 wounds were 10 mm in diameter 1 mm deep there is no sign of the foreign body there is no cellulitis no palpable masses.  Patient ambulates with a rolling seated walker he has an antalgic gait.  Difficulty getting from a sitting to a standing position.  Imaging: Xr Foot 2 Views Left  Result Date: 09/28/2018 2 view radiographs of the left foot shows peripheral vascular disease with calcification out to the toes there is no acute fracture patient does have pace planus there are no foreign bodies in the plantar aspect of his foot.  No images are attached to the encounter.  Labs: Lab Results  Component Value Date   HGBA1C 8.2 (H) 02/17/2018   HGBA1C 7.0 (H) 09/21/2014   HGBA1C (H) 08/02/2010    9.8 (NOTE)  According to the ADA Clinical Practice Recommendations for 2011, when HbA1c is used as a screening test:   >=6.5%   Diagnostic of Diabetes Mellitus           (if abnormal result  is confirmed)  5.7-6.4%   Increased risk of developing Diabetes Mellitus  References:Diagnosis and Classification of Diabetes Mellitus,Diabetes OJJK,0938,18(EXHBZ 1):S62-S69 and Standards of Medical Care in         Diabetes - 2011,Diabetes JIRC,7893,81  (Suppl 1):S11-S61.   REPTSTATUS 08/03/2010 FINAL 08/01/2010   CULT NO GROWTH 08/01/2010     Lab Results  Component Value Date   ALBUMIN 3.7 02/03/2016   ALBUMIN 3.8 09/21/2014   ALBUMIN 3.5 08/02/2010    Body mass index is 49.95 kg/m.  Orders:  Orders Placed This Encounter  Procedures  . XR Foot 2 Views Left   No orders of the defined types were placed in this encounter.    Procedures: No procedures performed  Clinical Data: No additional findings.  ROS:  All other systems negative,  except as noted in the HPI. Review of Systems  Objective: Vital Signs: Ht 5\' 3"  (1.6 m)   Wt 282 lb (127.9 kg)   BMI 49.95 kg/m   Specialty Comments:  No specialty comments available.  PMFS History: Patient Active Problem List   Diagnosis Date Noted  . Physical deconditioning 02/28/2018  . Venous stasis dermatitis of both lower extremities 02/17/2018  . CHF (congestive heart failure) ([place]) 02/17/2018  . Cellulitis of right leg 02/17/2018  . Hx of medication noncompliance 08/23/2017  . Encounter for long-term (current) use of medications 04/01/2017  . Syncope   . Type II diabetes mellitus ([place])   . Non-obstructive CAD   . NICM (nonischemic cardiomyopathy) ([place])   . Hypertensive heart disease with heart failure ([place])   . Chronic combined systolic and diastolic CHF, NYHA class 2 ([place])   . AICD (automatic cardioverter/defibrillator) present   . Acute on chronic systolic congestive heart failure ([place]) 06/21/2016  . Syncope and collapse 06/03/2016  . Non-ischemic cardiomyopathy ([place]) 06/03/2016  . Candida rash of groin 02/12/2016  . Cardiomyopathy ([place]) 02/04/2016  . Swelling of lower extremity 01/18/2016  . Shortness of breath 01/18/2016  . Edema 10/08/2014  . Intertrochanteric fx-closed ([place]) 10/04/2014  . S/P total hip arthroplasty 10/04/2014  . Diabetes mellitus type 2, controlled, with complications ([place])   . Pure hypercholesterolemia   . Essential hypertension   . Polyneuropathy in diabetes(357.2)   . Restless legs syndrome (RLS)   . Hip fracture ([place]) 09/21/2014  . Morbid obesity ([place]) 10/04/2013  . History of shingles 10/04/2013  . Varicose veins 10/04/2013  . Needs sleep apnea assessment 10/04/2013   Past Medical History:  Diagnosis Date  . Acute on chronic systolic congestive heart failure ([place]) 06/21/2016  . AICD (automatic cardioverter/defibrillator) present    a. 06/21/2016 SJM single lead AICD (ser # [id number]).  . Benign neoplasm of colon   . Candida rash of  groin 02/12/2016  . Cardiomyopathy ([place]) 02/04/2016  . Cellulitis of right leg 02/17/2018  . Chest pain    From rib fractures  . CHF (congestive heart failure) ([place]) 02/17/2018  . Chronic combined systolic and diastolic CHF (congestive heart failure) ([place])    a. 01/2016 Echo: EF 20-25%, diff HK; b. 05/2016 Echo: EF 35-40%, diff HK, gr1 DD, diff HK, mild MR, mod dil LA/RA, mod TR, PASP 8mmHg.  [name] Chronic combined systolic and diastolic CHF, NYHA class 2 ([place])    a. 01/2016  Echo: EF 20-25%, diff HK; b. 05/2016 Echo: EF 35-40%, diff HK, gr1 DD, diff HK, mild MR, mod dil LA/RA, mod TR, PASP 14mmHg.  . Diabetes mellitus type 2, controlled, with complications ([place])   . Edema 10/08/2014  . Encounter for long-term (current) use of medications 04/01/2017  . Essential hypertension   . Gout   . Hip fracture ([place]) 09/21/2014  . History of shingles 10/04/2013  . Hx of medication noncompliance 08/23/2017  . Hypertensive heart disease with heart failure ([place])   . Intertrochanteric fx-closed ([place]) 10/04/2014  . Lower extremity edema   . Morbid obesity ([place]) 10/04/2013  . Needs sleep apnea assessment 10/04/2013   Snoring, obesity, daytime somnolence    . NICM (nonischemic cardiomyopathy) ([place])     01/2016 Echo: EF 20-25%, b. 01/2016 Cath: LM nl, LAD min irregs, D1 50ost, LCX min irregs, RCA min irregs, RPDA/RPL1 nl;  c. 05/2016 Echo: EF 35-40%;  d. 06/2016 s/p SJM single lead AICD (ser # [id number])..  . Non-ischemic cardiomyopathy ([place]) 06/03/2016  . Non-obstructive CAD    a. 01/2016 Cath: LM nl, LAD min irregs, D1 50ost, LCX min irregs, RCA min irregs, RPDA/RPL1 nl.  . Physical deconditioning 02/28/2018  . Polyneuropathy in diabetes(357.2)   . Psychosexual dysfunction with inhibited sexual excitement   . Pure hypercholesterolemia   . Restless legs syndrome (RLS)   . 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 Shortness of breath 01/18/2016  . Sleep apnea    "probably" (06/21/2016)  . Sleep arousal disorder   .  Swelling of lower extremity 01/18/2016  . Syncope    a. Presumed to be 2/2 VT in setting of NICM-->s/p single lead SJM AICD in 06/2016.  [name] Syncope and collapse 06/03/2016  . Type II diabetes mellitus ([place])   . Unspecified venous (peripheral) insufficiency   . Varicose veins 10/04/2013  . Venous stasis dermatitis of both lower extremities 02/17/2018    Family History  Problem Relation Age of Onset  . Heart disease Mother   . Heart disease Father   . Heart disease Maternal Grandmother   . Heart disease Maternal Grandfather   . Stroke Paternal Grandmother   . Cancer Paternal Grandfather        Skin cancer  . Diabetes Brother     Past Surgical History:  Procedure Laterality Date  . CARDIAC CATHETERIZATION N/A 02/12/2016   Procedure: Right/Left Heart Cath and Coronary Angiography;  Surgeon: Pixie Casino, MD;  Location: [facility];  Service: Cardiovascular;  Laterality: N/A;  . CATARACT EXTRACTION W/ INTRAOCULAR LENS  IMPLANT, BILATERAL Bilateral 2009  . EP IMPLANTABLE DEVICE N/A 06/21/2016   Procedure: ICD Implant;  Surgeon: Evans Lance, MD;  Location: [facility];  Service: Cardiovascular;  Laterality: N/A;  . FIXATION KYPHOPLASTY LUMBAR SPINE  2008  . FRACTURE SURGERY    . INTRAMEDULLARY (IM) NAIL INTERTROCHANTERIC Left 09/21/2014   Procedure: left hip intertroch fracture;  Surgeon: [name], MD;  Location: [facility];  Service: Orthopedics;  Laterality: Left;  . LOWER EXTREMITY VENOUS DOPPLER  09/04/2012   Mild valvular insufficiency in the R Common Femoral vein w/ 1.6 sec of duration of refliux  . TRANSTHORACIC ECHOCARDIOGRAM  09/04/2012   EF >55%, normal   Social History   Occupational History  . Not on file  Tobacco Use  . Smoking status: Never Smoker  . Smokeless tobacco: Never Used  Substance and Sexual Activity  . Alcohol use: No  .  Drug use: No  . Sexual activity: Yes

## 2018-10-03 NOTE — Progress Notes (Signed)
No ICM remote transmission received for 09/28/2018 and next ICM transmission scheduled for 11/06/2018.

## 2018-11-06 ENCOUNTER — Ambulatory Visit (INDEPENDENT_AMBULATORY_CARE_PROVIDER_SITE_OTHER): Payer: Medicare Other

## 2018-11-06 DIAGNOSIS — I5042 Chronic combined systolic (congestive) and diastolic (congestive) heart failure: Secondary | ICD-10-CM | POA: Diagnosis not present

## 2018-11-06 DIAGNOSIS — I428 Other cardiomyopathies: Secondary | ICD-10-CM

## 2018-11-06 DIAGNOSIS — Z95810 Presence of automatic (implantable) cardiac defibrillator: Secondary | ICD-10-CM

## 2018-11-06 NOTE — Progress Notes (Signed)
Remote ICD transmission.   

## 2018-11-07 ENCOUNTER — Telehealth: Payer: Self-pay

## 2018-11-07 NOTE — Telephone Encounter (Signed)
Remote ICM transmission received.  Attempted call to patient regarding ICM remote transmission and left message, per DPR, to return call.    

## 2018-11-07 NOTE — Progress Notes (Signed)
EPIC Encounter for ICM Monitoring  Patient Name: Jeffery Nelson is a 72 y.o. male Date: 11/07/2018 Primary Care Physican: [name], MD Primary Cardiologist:[place] Electrophysiologist:[place] Last Weight: 270lbs Today's Weight: unknown       Attempted call to patient and unable to reach.  Left message to return call.  Transmission reviewed.    Thoracic impedance normal.   Prescribed: Furosemide40 mg take 2 tablets (80 mg total) twice a day. Potassium 20 mEq 1 tablet daily.  Labs: 03/08/2018 Creatinine0.88, Glory Buff, Potassium4.4, [id number], [id number] 02/22/2018 Creatinine0.96, BUN28, Potassium3.8, Sodium132, EGFR>60  02/21/2018 Creatinine0.84, BUN24, Potassium3.7, Sodium132, EGFR>60  02/20/2018 Creatinine0.69, BUN23, Potassium4.2, [id number], EGFR>60  02/19/2018 Creatinine0.74, BUN27, Potassium4.4, FOADLK589, EGFR>60  02/18/2018 Creatinine0.76, BUN28, Potassium4.2, [id number], EGFR>60  02/17/2018 Creatinine0.74, BUN25, Potassium4.0, Sodium136, EGFR>60 A complete set of results can be found in Results Review.  Recommendations: Unable to reach.  Follow-up plan: ICM clinic phone appointment on 12/07/2018.    Copy of ICM check sent to Dr. Lovena Le.   3 month ICM trend: 11/06/2018    1 Year ICM trend:       Rosalene Billings, RN 11/07/2018 10:03 AM

## 2018-11-09 ENCOUNTER — Encounter: Payer: Self-pay | Admitting: Cardiology

## 2018-12-07 ENCOUNTER — Ambulatory Visit (INDEPENDENT_AMBULATORY_CARE_PROVIDER_SITE_OTHER): Payer: Medicare Other

## 2018-12-07 ENCOUNTER — Telehealth: Payer: Self-pay

## 2018-12-07 DIAGNOSIS — Z95810 Presence of automatic (implantable) cardiac defibrillator: Secondary | ICD-10-CM | POA: Diagnosis not present

## 2018-12-07 DIAGNOSIS — I5042 Chronic combined systolic (congestive) and diastolic (congestive) heart failure: Secondary | ICD-10-CM

## 2018-12-07 NOTE — Telephone Encounter (Signed)
Spoke with pt and reminded pt of remote transmission that is due today. Pt verbalized understanding.   

## 2018-12-08 ENCOUNTER — Telehealth: Payer: Self-pay

## 2018-12-08 NOTE — Progress Notes (Signed)
EPIC Encounter for ICM Monitoring  Patient Name: Jeffery Nelson is a 72 y.o. male Date: 12/08/2018 Primary Care Physican: [name], MD Primary Cardiologist:[place] Electrophysiologist:[place] Last Weight: 270lbs Today's Weight: unknown                                                   Attempted call to patient and unable to reach.  Left message to return call.  Transmission reviewed.    Thoracic impedance abnormal suggesting fluid accumulation starting 11/28/2018 and trending just under baseline.   Prescribed: Furosemide40 mg take 2 tablets (80 mg total) twice a day. Potassium 20 mEq 1 tablet daily.  Labs: 03/08/2018 Creatinine0.88, Glory Buff, Potassium4.4, [id number], [id number] 02/22/2018 Creatinine0.96, BUN28, Potassium3.8, Sodium132, EGFR>60  02/21/2018 Creatinine0.84, BUN24, Potassium3.7, Sodium132, EGFR>60  02/20/2018 Creatinine0.69, BUN23, Potassium4.2, [id number], EGFR>60  02/19/2018 Creatinine0.74, BUN27, Potassium4.4, [id number], EGFR>60  02/18/2018 Creatinine0.76, BUN28, Potassium4.2, [id number], EGFR>60  02/17/2018 Creatinine0.74, BUN25, Potassium4.0, Sodium136, EGFR>60 A complete set of results can be found in Results Review.  Recommendations: Unable to reach.  Follow-up plan: ICM clinic phone appointment on 12/18/2018 to recheck fluid levels.    Copy of ICM check sent to Dr. Lovena Le and Dr Debara Pickett.   3 month ICM trend: 12/07/2018    1 Year ICM trend:       Rosalene Billings, RN 12/08/2018 12:11 PM

## 2018-12-08 NOTE — Telephone Encounter (Signed)
Remote ICM transmission received.  Attempted call to patient regarding ICM remote transmission and left message, per DPR, to return call.    

## 2018-12-08 NOTE — Progress Notes (Signed)
Patient returned call.  He stated he has some weight gain and feels like he has fluid abdomen.  Baseline weight is usually around 270-273 lbs but he weighs 280 lbs today.  He contributes the fluid accumulation to eating holiday foods.  Recommended: Advised to take Furosemide 120 mg in AM and 120 mg PM x 2 days only and then return to 80 mg twice a day.  He verbalized understanding.  Next remote transmission 12/18/2018

## 2018-12-12 ENCOUNTER — Emergency Department (HOSPITAL_COMMUNITY)
Admission: EM | Admit: 2018-12-12 | Discharge: 2018-12-12 | Disposition: A | Discharge status: Home / Self Care | Payer: Medicare Other | Attending: Emergency Medicine | Admitting: Emergency Medicine

## 2018-12-12 ENCOUNTER — Emergency Department (HOSPITAL_COMMUNITY): Payer: Medicare Other

## 2018-12-12 ENCOUNTER — Encounter (HOSPITAL_COMMUNITY): Payer: Self-pay | Admitting: Emergency Medicine

## 2018-12-12 ENCOUNTER — Other Ambulatory Visit: Payer: Self-pay

## 2018-12-12 DIAGNOSIS — S42211A Unspecified displaced fracture of surgical neck of right humerus, initial encounter for closed fracture: Secondary | ICD-10-CM

## 2018-12-12 DIAGNOSIS — Z7982 Long term (current) use of aspirin: Secondary | ICD-10-CM | POA: Insufficient documentation

## 2018-12-12 DIAGNOSIS — Y939 Activity, unspecified: Secondary | ICD-10-CM | POA: Insufficient documentation

## 2018-12-12 DIAGNOSIS — Y92003 Bedroom of unspecified non-institutional (private) residence as the place of occurrence of the external cause: Secondary | ICD-10-CM | POA: Diagnosis not present

## 2018-12-12 DIAGNOSIS — Z79899 Other long term (current) drug therapy: Secondary | ICD-10-CM | POA: Diagnosis not present

## 2018-12-12 DIAGNOSIS — I428 Other cardiomyopathies: Secondary | ICD-10-CM | POA: Insufficient documentation

## 2018-12-12 DIAGNOSIS — S42214A Unspecified nondisplaced fracture of surgical neck of right humerus, initial encounter for closed fracture: Secondary | ICD-10-CM | POA: Diagnosis not present

## 2018-12-12 DIAGNOSIS — E119 Type 2 diabetes mellitus without complications: Secondary | ICD-10-CM | POA: Insufficient documentation

## 2018-12-12 DIAGNOSIS — S4991XA Unspecified injury of right shoulder and upper arm, initial encounter: Secondary | ICD-10-CM | POA: Diagnosis present

## 2018-12-12 DIAGNOSIS — Z794 Long term (current) use of insulin: Secondary | ICD-10-CM | POA: Diagnosis not present

## 2018-12-12 DIAGNOSIS — W06XXXA Fall from bed, initial encounter: Secondary | ICD-10-CM | POA: Diagnosis not present

## 2018-12-12 DIAGNOSIS — I5042 Chronic combined systolic (congestive) and diastolic (congestive) heart failure: Secondary | ICD-10-CM | POA: Diagnosis not present

## 2018-12-12 DIAGNOSIS — Y998 Other external cause status: Secondary | ICD-10-CM | POA: Insufficient documentation

## 2018-12-12 DIAGNOSIS — I110 Hypertensive heart disease with heart failure: Secondary | ICD-10-CM | POA: Insufficient documentation

## 2018-12-12 DIAGNOSIS — E7800 Pure hypercholesterolemia, unspecified: Secondary | ICD-10-CM | POA: Insufficient documentation

## 2018-12-12 MED ORDER — DOCUSATE SODIUM 100 MG PO CAPS: 100.0000 mg | ORAL_CAPSULE | Freq: Two times a day (BID) | ORAL | 0 refills | Status: AC

## 2018-12-12 MED ORDER — HYDROCODONE-ACETAMINOPHEN 5-325 MG PO TABS: 1.0000 | ORAL_TABLET | ORAL | 0 refills | Status: DC | PRN

## 2018-12-12 MED ORDER — OXYCODONE-ACETAMINOPHEN 5-325 MG PO TABS
1.0000 | ORAL_TABLET | Freq: Once | ORAL | Status: AC
  Administered 2018-12-12: 1 via ORAL
  Filled 2018-12-12: qty 1

## 2018-12-12 NOTE — ED Provider Notes (Signed)
[facility] EMERGENCY DEPARTMENT Provider Note   CSN: [id number] Arrival date & time: 12/12/18  1746     History   Chief Complaint Chief Complaint  Patient presents with  . Fall    HPI Jeffery Nelson is a 72 y.o. male.  HPI Patient presents the emergency department after mechanical fall out of his bed this morning.  He reports he was sitting on the edge of the bed and fell out hitting the right side of his arm on a computer desk.  He was unable to get up off the floor secondary to severe pain.  He was found by family and brought to the emergency department for further evaluation.  Denies chest pain shortness of breath.  No neck pain.  No headache or head injury.  No use of anticoagulants.  Reports pain with range of motion of his right shoulder and right upper arm.  Presents with bruising to the proximal right humeral region.  Denies weakness in his hands.  Denies lower extremity joint pain symptoms are mild in severity   Past Medical History:  Diagnosis Date  . Acute on chronic systolic congestive heart failure ([place]) 06/21/2016  . AICD (automatic cardioverter/defibrillator) present    a. 06/21/2016 SJM single lead AICD (ser # [id number]).  . Benign neoplasm of colon   . Candida rash of groin 02/12/2016  . Cardiomyopathy ([place]) 02/04/2016  . Cellulitis of right leg 02/17/2018  . Chest pain    From rib fractures  . CHF (congestive heart failure) ([place]) 02/17/2018  . Chronic combined systolic and diastolic CHF (congestive heart failure) ([place])    a. 01/2016 Echo: EF 20-25%, diff HK; b. 05/2016 Echo: EF 35-40%, diff HK, gr1 DD, diff HK, mild MR, mod dil LA/RA, mod TR, PASP 54mmHg.  [name] Chronic combined systolic and diastolic CHF, NYHA class 2 ([place])    a. 01/2016 Echo: EF 20-25%, diff HK; b. 05/2016 Echo: EF 35-40%, diff HK, gr1 DD, diff HK, mild MR, mod dil LA/RA, mod TR, PASP 31mmHg.  . Diabetes mellitus type 2, controlled, with complications ([place])   . Edema 10/08/2014  .  Encounter for long-term (current) use of medications 04/01/2017  . Essential hypertension   . Gout   . Hip fracture ([place]) 09/21/2014  . History of shingles 10/04/2013  . Hx of medication noncompliance 08/23/2017  . Hypertensive heart disease with heart failure ([place])   . Intertrochanteric fx-closed ([place]) 10/04/2014  . Lower extremity edema   . Morbid obesity ([place]) 10/04/2013  . Needs sleep apnea assessment 10/04/2013   Snoring, obesity, daytime somnolence    . NICM (nonischemic cardiomyopathy) ([place])     01/2016 Echo: EF 20-25%, b. 01/2016 Cath: LM nl, LAD min irregs, D1 50ost, LCX min irregs, RCA min irregs, RPDA/RPL1 nl;  c. 05/2016 Echo: EF 35-40%;  d. 06/2016 s/p SJM single lead AICD (ser # [id number])..  . Non-ischemic cardiomyopathy ([place]) 06/03/2016  . Non-obstructive CAD    a. 01/2016 Cath: LM nl, LAD min irregs, D1 50ost, LCX min irregs, RCA min irregs, RPDA/RPL1 nl.  . Physical deconditioning 02/28/2018  . Polyneuropathy in diabetes(357.2)   . Psychosexual dysfunction with inhibited sexual excitement   . Pure hypercholesterolemia   . Restless legs syndrome (RLS)   . 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 Shortness of breath 01/18/2016  . Sleep apnea    "probably" (06/21/2016)  . Sleep arousal disorder   . Swelling of lower extremity 01/18/2016  .  Syncope    a. Presumed to be 2/2 VT in setting of NICM-->s/p single lead SJM AICD in 06/2016.  [name] Syncope and collapse 06/03/2016  . Type II diabetes mellitus ([place])   . Unspecified venous (peripheral) insufficiency   . Varicose veins 10/04/2013  . Venous stasis dermatitis of both lower extremities 02/17/2018    Patient Active Problem List   Diagnosis Date Noted  . Physical deconditioning 02/28/2018  . Venous stasis dermatitis of both lower extremities 02/17/2018  . CHF (congestive heart failure) ([place]) 02/17/2018  . Cellulitis of right leg 02/17/2018  . Hx of medication noncompliance 08/23/2017  . Encounter for long-term (current) use  of medications 04/01/2017  . Syncope   . Type II diabetes mellitus ([place])   . Non-obstructive CAD   . NICM (nonischemic cardiomyopathy) ([place])   . Hypertensive heart disease with heart failure ([place])   . Chronic combined systolic and diastolic CHF, NYHA class 2 ([place])   . AICD (automatic cardioverter/defibrillator) present   . Acute on chronic systolic congestive heart failure ([place]) 06/21/2016  . Syncope and collapse 06/03/2016  . Non-ischemic cardiomyopathy ([place]) 06/03/2016  . Candida rash of groin 02/12/2016  . Cardiomyopathy ([place]) 02/04/2016  . Swelling of lower extremity 01/18/2016  . Shortness of breath 01/18/2016  . Edema 10/08/2014  . Intertrochanteric fx-closed ([place]) 10/04/2014  . S/P total hip arthroplasty 10/04/2014  . Diabetes mellitus type 2, controlled, with complications ([place])   . Pure hypercholesterolemia   . Essential hypertension   . Polyneuropathy in diabetes(357.2)   . Restless legs syndrome (RLS)   . Hip fracture ([place]) 09/21/2014  . Morbid obesity ([place]) 10/04/2013  . History of shingles 10/04/2013  . Varicose veins 10/04/2013  . Needs sleep apnea assessment 10/04/2013    Past Surgical History:  Procedure Laterality Date  . CARDIAC CATHETERIZATION N/A 02/12/2016   Procedure: Right/Left Heart Cath and Coronary Angiography;  Surgeon: Pixie Casino, MD;  Location: [facility];  Service: Cardiovascular;  Laterality: N/A;  . CATARACT EXTRACTION W/ INTRAOCULAR LENS  IMPLANT, BILATERAL Bilateral 2009  . EP IMPLANTABLE DEVICE N/A 06/21/2016   Procedure: ICD Implant;  Surgeon: Evans Lance, MD;  Location: [facility];  Service: Cardiovascular;  Laterality: N/A;  . FIXATION KYPHOPLASTY LUMBAR SPINE  2008  . FRACTURE SURGERY    . INTRAMEDULLARY (IM) NAIL INTERTROCHANTERIC Left 09/21/2014   Procedure: left hip intertroch fracture;  Surgeon: [name], MD;  Location: [facility];  Service: Orthopedics;  Laterality: Left;  . LOWER EXTREMITY VENOUS DOPPLER   09/04/2012   Mild valvular insufficiency in the R Common Femoral vein w/ 1.6 sec of duration of refliux  . TRANSTHORACIC ECHOCARDIOGRAM  09/04/2012   EF >55%, normal        Home Medications    Prior to Admission medications   Medication Sig Start Date End Date Taking? Authorizing Provider  aspirin EC 81 MG tablet Take 81 mg by mouth daily.   Yes [provider]  Multiple Minerals-Vitamins (CAL MAG ZINC +D3) TABS Take 1 tablet by mouth daily.   Yes [provider]  potassium chloride SA (K-DUR,KLOR-CON) 20 MEQ tablet TAKE 1 TABLET BY MOUTH TWICE DAILY Patient taking differently: Take 20 mEq by mouth 2 (two) times daily.  06/12/18  Yes Hilty, Nadean Corwin, MD  rOPINIRole (REQUIP) 4 MG tablet Take 4 mg by mouth daily.    Yes [provider]  rosuvastatin (CRESTOR) 20 MG tablet Take 1 tablet (20 mg total) by mouth daily.  02/23/18  Yes Kathie Dike, MD  carvedilol (COREG) 3.125 MG tablet Take 1 tablet (3.125 mg total) by mouth 2 (two) times daily with a meal. 05/04/18   Hilty, Nadean Corwin, MD  docusate sodium (COLACE) 100 MG capsule Take 1 capsule (100 mg total) by mouth every 12 (twelve) hours. 12/12/18   Jola Schmidt, MD  FLUoxetine (PROZAC) 20 MG tablet Take 20 mg by mouth daily.    [provider]  furosemide (LASIX) 80 MG tablet Take 80 mg by mouth 2 (two) times daily. 09/07/18   [provider]  HUMALOG MIX 75/25 (75-25) 100 UNIT/ML SUSP injection Inject 30-35 Units as directed 3 (three) times daily with meals. Inject 70 units at breakfast and dinner, and 55 units at lunch time 05/03/15   [provider]  HYDROcodone-acetaminophen (NORCO/VICODIN) 5-325 MG tablet Take 1 tablet by mouth every 4 (four) hours as needed for moderate pain. 12/12/18   Jola Schmidt, MD  sacubitril-valsartan (ENTRESTO) 49-51 MG Take 1 tablet by mouth 2 (two) times daily. 06/29/18   Hilty, Nadean Corwin, MD    Family History Family History  Problem Relation Age of Onset    . Heart disease Mother   . Heart disease Father   . Heart disease Maternal Grandmother   . Heart disease Maternal Grandfather   . Stroke Paternal Grandmother   . Cancer Paternal Grandfather        Skin cancer  . Diabetes Brother     Social History Social History   Tobacco Use  . Smoking status: Never Smoker  . Smokeless tobacco: Never Used  Substance Use Topics  . Alcohol use: No  . Drug use: No     Allergies   Doxycycline; Lipitor [atorvastatin]; and Lyrica [pregabalin]   Review of Systems Review of Systems  All other systems reviewed and are negative.    Physical Exam Updated Vital Signs BP (!) 132/99   Pulse 97   Temp 97.9 F (36.6 C) (Oral)   Resp 20   SpO2 95%   Physical Exam Vitals signs and nursing note reviewed.  Constitutional:      Appearance: He is well-developed.  HENT:     Head: Normocephalic.  Neck:     Musculoskeletal: Normal range of motion. No muscular tenderness.  Cardiovascular:     Rate and Rhythm: Normal rate.  Pulmonary:     Effort: Pulmonary effort is normal.  Abdominal:     General: There is no distension.  Musculoskeletal:     Comments: Mild pain with range of motion of the right shoulder and bruising noted of the right anterior shoulder and right proximal humeral region.  Normal right radial pulse.  Normal grip strength right hand.  No tenderness over the right clavicle.  Full range of motion of bilateral hips, knees, ankles.  Full range of motion of left shoulder, elbow, wrist.  Full range of motion of the right elbow and right wrist.  Neurological:     Mental Status: He is alert and oriented to person, place, and time.      ED Treatments / Results  Labs (all labs ordered are listed, but only abnormal results are displayed) Labs Reviewed - No data to display  EKG EKG Interpretation  Date/Time:  Tuesday December 12 2018 17:56:38 EST Ventricular Rate:  114 PR Interval:    QRS Duration: 117 QT Interval:  351 QTC  Calculation: 484 R Axis:   -15 Text Interpretation:  Atrial fibrillation Nonspecific intraventricular conduction delay Anterior infarct, old  Borderline repolarization abnormality No significant change was found Confirmed by Jola Schmidt [phone]) on 12/12/2018 8:42:30 PM   Radiology Dg Shoulder Right  Result Date: 12/12/2018 CLINICAL DATA:  Initial evaluation for acute trauma, fall. EXAM: RIGHT SHOULDER - 2+ VIEW COMPARISON:  Prior MRI from 10/14/2010. FINDINGS: Acute oblique fracture extends through the surgical neck of the right humerus. Associated mild superior subluxation of the humeral shaft relative to the humeral head. The humeral head remains grossly approximated with the glenoid. Glenoid itself grossly intact. AC joint remains approximated and intact. Visualized soft tissues demonstrate no acute finding. Visualized right hemithorax clear. IMPRESSION: Acute mildly displaced oblique fracture through the neck of the right humerus. Electronically Signed   By: Jeannine Boga M.D.   On: 12/12/2018 19:52   Dg Humerus Right  Result Date: 12/12/2018 CLINICAL DATA:  Initial evaluation for acute trauma, fall. EXAM: RIGHT HUMERUS - 2+ VIEW COMPARISON:  Concomitant radiograph of the right shoulder. FINDINGS: Acute oblique fracture extends through the neck of the right humerus with slight superior subluxation/displacement. Finding better evaluated on concomitant radiograph of the right shoulder. Note also made of a thin linear density adjacent to the lateral margin of the mid right humeral shaft, suggestive of periosteal lifting. Finding of uncertain etiology, and could be related to subperiosteal hematoma related to the humeral neck fracture. Possible avulsive injury at the deltoid insertion could also be considered. No aggressive osseous lesions or discernible soft tissue mass seen at this location. No other acute fracture or dislocation about the right humerus. Limited views of the elbow grossly  unremarkable. Soft tissue induration and swelling noted at the upper arm. IMPRESSION: 1. Acute oblique fracture through the neck of the right humerus, better evaluated on concomitant radiograph of the right shoulder. 2. Thin linear density adjacent to the lateral margin of the mid right humeral shaft, most consistent with periosteal lifting. Finding of uncertain etiology, and could be secondary to subperiosteal hematoma related to the humeral neck fracture. Possible avulsive injury at the deltoid insertion could also be considered. Attention at follow-up recommended. Electronically Signed   By: Jeannine Boga M.D.   On: 12/12/2018 20:05    Procedures .Splint Application Date/Time: 50/08/3817 8:46 PM Performed by: Jola Schmidt, MD Authorized by: Jola Schmidt, MD     SPLINT APPLICATION Authorized by: Jola Schmidt Consent: Verbal consent obtained. Risks and benefits: risks, benefits and alternatives were discussed Consent given by: patient Splint applied by: orthopedic technician Location details: right upper extremity Splint type: sling Supplies used: sling Post-procedure: The splinted body part was neurovascularly unchanged following the procedure. Patient tolerance: Patient tolerated the procedure well with no immediate complications.     Medications Ordered in ED Medications  oxyCODONE-acetaminophen (PERCOCET/ROXICET) 5-325 MG per tablet 1 tablet (1 tablet Oral Given 12/12/18 1858)     Initial Impression / Assessment and Plan / ED Course  I have reviewed the triage vital signs and the nursing notes.  Pertinent labs & imaging results that were available during my care of the patient were reviewed by me and considered in my medical decision making (see chart for details).    Proximal right humerus fracture.  Sling applied.  Orthopedic follow-up.  Home with pain medication and Colace.  Patient understands return to the ER for new or worsening symptoms.  No head injury.  No  neck pain.  C-spine cleared by Nexus criteria.  Final Clinical Impressions(s) / ED Diagnoses   Final diagnoses:  Closed fracture of neck of right humerus, initial encounter  ED Discharge Orders         Ordered    HYDROcodone-acetaminophen (NORCO/VICODIN) 5-325 MG tablet  Every 4 hours PRN     12/12/18 2044    docusate sodium (COLACE) 100 MG capsule  Every 12 hours     12/12/18 2044           Jola Schmidt, MD 12/12/18 2047

## 2018-12-12 NOTE — ED Triage Notes (Signed)
Patient to ED c/o R shoulder pain after fall earlier today - reports he was sitting on the edge of the bed and hit his R side on the computer desk - hematoma noted to R shoulder going down medial R arm. Pt endorses severe pain with movement. Radial pulses equal bilaterally. Also endorses L side pain, denies new or worsening shortness of breath or chest pain. Has [employer] ICD - unable to say why the fall actually occurred, but states he did not pass out. Denies head/neck pain.

## 2018-12-12 NOTE — ED Notes (Signed)
Reviewed d/c instructions with pt, who verbalized understanding and had no outstanding questions. Pt departed in NAD.   

## 2018-12-12 NOTE — ED Notes (Signed)
Patient transported to X-ray 

## 2018-12-19 NOTE — Progress Notes (Signed)
No ICM remote transmission received for 12/18/2018 and next ICM transmission scheduled for 01/15/2019.

## 2018-12-21 ENCOUNTER — Ambulatory Visit (INDEPENDENT_AMBULATORY_CARE_PROVIDER_SITE_OTHER): Payer: Medicare Other

## 2018-12-21 DIAGNOSIS — Z95810 Presence of automatic (implantable) cardiac defibrillator: Secondary | ICD-10-CM

## 2018-12-21 DIAGNOSIS — I5042 Chronic combined systolic (congestive) and diastolic (congestive) heart failure: Secondary | ICD-10-CM

## 2018-12-21 NOTE — Progress Notes (Signed)
EPIC Encounter for ICM Monitoring  Patient Name: Jeffery Nelson is a 73 y.o. male Date: 12/21/2018 Primary Care Physican: [name], MD Primary Cardiologist:Hilty Electrophysiologist:Taylor Last Weight:270lbs Today's Weight: unknown  Spoke with patient to recheck fluid levels.  Heart failure questions reviewed. Advised of remote transmission results.     Symptoms: Asymptomatic. He fell Christmas eve resulting in a broken arm, no surgery required.  Corvue thoracic impedance returned to normal since 12/07/2018 remote transmission.  Prescribed: Furosemide40 mg take 2 tablets (80 mg total) twice a day. Potassium 20 mEq 1 tablet daily.  Labs: 03/08/2018 Creatinine0.88, Glory Buff, Potassium4.4, [id number], [id number] 02/22/2018 Creatinine0.96, BUN28, Potassium3.8, Sodium132, EGFR>60  02/21/2018 Creatinine0.84, BUN24, Potassium3.7, Sodium132, EGFR>60  02/20/2018 Creatinine0.69, BUN23, Potassium4.2, Sodium133, EGFR>60  02/19/2018 Creatinine0.74, BUN27, Potassium4.4, Sodium137, EGFR>60  02/18/2018 Creatinine0.76, BUN28, Potassium4.2, [id number], EGFR>60  02/17/2018 Creatinine0.74, BUN25, Potassium4.0, Sodium136, EGFR>60 A complete set of results can be found in Results Review  Recommendations: Encouraged to call for fluid symptoms.         ICM Follow up plan: 01/15/2019.            Copy of ICM check sent to Dr. Lovena Le.          3 month ICM trend: 12/20/2018    1 Year ICM trend:     Rosalene Billings, RN 12/21/2018 4:32 PM

## 2018-12-25 ENCOUNTER — Telehealth: Payer: Self-pay | Admitting: Internal Medicine

## 2018-12-25 ENCOUNTER — Emergency Department (HOSPITAL_COMMUNITY): Payer: Medicare Other

## 2018-12-25 ENCOUNTER — Inpatient Hospital Stay (HOSPITAL_COMMUNITY)
Admission: EM | Admit: 2018-12-25 | Discharge: 2019-01-03 | DRG: 308 | Disposition: A | Discharge status: Skilled Nursing Facility | Payer: Medicare Other | Attending: Internal Medicine | Admitting: Internal Medicine

## 2018-12-25 ENCOUNTER — Other Ambulatory Visit: Payer: Self-pay

## 2018-12-25 ENCOUNTER — Encounter (HOSPITAL_COMMUNITY): Payer: Self-pay | Admitting: Emergency Medicine

## 2018-12-25 DIAGNOSIS — G4733 Obstructive sleep apnea (adult) (pediatric): Secondary | ICD-10-CM | POA: Diagnosis present

## 2018-12-25 DIAGNOSIS — Z9114 Patient's other noncompliance with medication regimen: Secondary | ICD-10-CM

## 2018-12-25 DIAGNOSIS — E8881 Metabolic syndrome: Secondary | ICD-10-CM | POA: Diagnosis present

## 2018-12-25 DIAGNOSIS — J9600 Acute respiratory failure, unspecified whether with hypoxia or hypercapnia: Secondary | ICD-10-CM | POA: Diagnosis present

## 2018-12-25 DIAGNOSIS — Z79899 Other long term (current) drug therapy: Secondary | ICD-10-CM

## 2018-12-25 DIAGNOSIS — I878 Other specified disorders of veins: Secondary | ICD-10-CM | POA: Diagnosis present

## 2018-12-25 DIAGNOSIS — D649 Anemia, unspecified: Secondary | ICD-10-CM

## 2018-12-25 DIAGNOSIS — I110 Hypertensive heart disease with heart failure: Secondary | ICD-10-CM | POA: Diagnosis present

## 2018-12-25 DIAGNOSIS — G8929 Other chronic pain: Secondary | ICD-10-CM

## 2018-12-25 DIAGNOSIS — Z95810 Presence of automatic (implantable) cardiac defibrillator: Secondary | ICD-10-CM

## 2018-12-25 DIAGNOSIS — Z96642 Presence of left artificial hip joint: Secondary | ICD-10-CM | POA: Diagnosis present

## 2018-12-25 DIAGNOSIS — I428 Other cardiomyopathies: Secondary | ICD-10-CM | POA: Diagnosis present

## 2018-12-25 DIAGNOSIS — R0602 Shortness of breath: Secondary | ICD-10-CM

## 2018-12-25 DIAGNOSIS — L0390 Cellulitis, unspecified: Secondary | ICD-10-CM

## 2018-12-25 DIAGNOSIS — D638 Anemia in other chronic diseases classified elsewhere: Secondary | ICD-10-CM | POA: Diagnosis present

## 2018-12-25 DIAGNOSIS — L03115 Cellulitis of right lower limb: Secondary | ICD-10-CM

## 2018-12-25 DIAGNOSIS — Z8249 Family history of ischemic heart disease and other diseases of the circulatory system: Secondary | ICD-10-CM

## 2018-12-25 DIAGNOSIS — E785 Hyperlipidemia, unspecified: Secondary | ICD-10-CM

## 2018-12-25 DIAGNOSIS — I2510 Atherosclerotic heart disease of native coronary artery without angina pectoris: Secondary | ICD-10-CM | POA: Diagnosis present

## 2018-12-25 DIAGNOSIS — I4819 Other persistent atrial fibrillation: Principal | ICD-10-CM | POA: Diagnosis present

## 2018-12-25 DIAGNOSIS — I472 Ventricular tachycardia: Secondary | ICD-10-CM | POA: Diagnosis present

## 2018-12-25 DIAGNOSIS — R0609 Other forms of dyspnea: Secondary | ICD-10-CM

## 2018-12-25 DIAGNOSIS — E1142 Type 2 diabetes mellitus with diabetic polyneuropathy: Secondary | ICD-10-CM | POA: Diagnosis present

## 2018-12-25 DIAGNOSIS — G2581 Restless legs syndrome: Secondary | ICD-10-CM

## 2018-12-25 DIAGNOSIS — Z833 Family history of diabetes mellitus: Secondary | ICD-10-CM

## 2018-12-25 DIAGNOSIS — I4891 Unspecified atrial fibrillation: Secondary | ICD-10-CM | POA: Diagnosis present

## 2018-12-25 DIAGNOSIS — R69 Illness, unspecified: Secondary | ICD-10-CM

## 2018-12-25 DIAGNOSIS — E7800 Pure hypercholesterolemia, unspecified: Secondary | ICD-10-CM | POA: Diagnosis present

## 2018-12-25 DIAGNOSIS — R5381 Other malaise: Secondary | ICD-10-CM | POA: Diagnosis present

## 2018-12-25 DIAGNOSIS — S42309A Unspecified fracture of shaft of humerus, unspecified arm, initial encounter for closed fracture: Secondary | ICD-10-CM

## 2018-12-25 DIAGNOSIS — Z794 Long term (current) use of insulin: Secondary | ICD-10-CM

## 2018-12-25 DIAGNOSIS — Z6841 Body Mass Index (BMI) 40.0 and over, adult: Secondary | ICD-10-CM

## 2018-12-25 DIAGNOSIS — E119 Type 2 diabetes mellitus without complications: Secondary | ICD-10-CM

## 2018-12-25 DIAGNOSIS — Z7982 Long term (current) use of aspirin: Secondary | ICD-10-CM

## 2018-12-25 DIAGNOSIS — E871 Hypo-osmolality and hyponatremia: Secondary | ICD-10-CM

## 2018-12-25 DIAGNOSIS — R0600 Dyspnea, unspecified: Secondary | ICD-10-CM

## 2018-12-25 DIAGNOSIS — I5043 Acute on chronic combined systolic (congestive) and diastolic (congestive) heart failure: Secondary | ICD-10-CM

## 2018-12-25 DIAGNOSIS — R32 Unspecified urinary incontinence: Secondary | ICD-10-CM | POA: Diagnosis not present

## 2018-12-25 LAB — COMPREHENSIVE METABOLIC PANEL
ALBUMIN: 3.3 g/dL — AB (ref 3.5–5.0)
ALT: 15 U/L (ref 0–44)
AST: 17 U/L (ref 15–41)
Alkaline Phosphatase: 116 U/L (ref 38–126)
Anion gap: 9 (ref 5–15)
BUN: 30 mg/dL — AB (ref 8–23)
CO2: 23 mmol/L (ref 22–32)
Calcium: 8.4 mg/dL — ABNORMAL LOW (ref 8.9–10.3)
Chloride: 96 mmol/L — ABNORMAL LOW (ref 98–111)
Creatinine, Ser: 0.8 mg/dL (ref 0.61–1.24)
GFR calc Af Amer: >60 mL/min (ref >60)
GFR calc non Af Amer: >60 mL/min (ref >60)
Glucose, Bld: 167 mg/dL — ABNORMAL HIGH (ref 70–99)
Potassium: 4.7 mmol/L (ref 3.5–5.1)
Sodium: 128 mmol/L — ABNORMAL LOW (ref 135–145)
Total Bilirubin: 0.9 mg/dL (ref 0.3–1.2)
Total Protein: 6.7 g/dL (ref 6.5–8.1)

## 2018-12-25 LAB — CBC WITH DIFFERENTIAL/PLATELET
Abs Immature Granulocytes: 0.03 10*3/uL (ref 0.00–0.07)
Basophils Absolute: 0 10*3/uL (ref 0.0–0.1)
Basophils Relative: 1 %
Eosinophils Absolute: 0.1 10*3/uL (ref 0.0–0.5)
Eosinophils Relative: 1 %
HEMATOCRIT: 36 % — AB (ref 39.0–52.0)
Hemoglobin: 11.2 g/dL — ABNORMAL LOW (ref 13.0–17.0)
Immature Granulocytes: 0 %
LYMPHS ABS: 0.8 10*3/uL (ref 0.7–4.0)
Lymphocytes Relative: 10 %
MCH: 25.7 pg — ABNORMAL LOW (ref 26.0–34.0)
MCHC: 31.1 g/dL (ref 30.0–36.0)
MCV: 82.6 fL (ref 80.0–100.0)
Monocytes Absolute: 0.7 10*3/uL (ref 0.1–1.0)
Monocytes Relative: 8 %
Neutro Abs: 6.4 10*3/uL (ref 1.7–7.7)
Neutrophils Relative %: 80 %
Platelets: 239 10*3/uL (ref 150–400)
RBC: 4.36 MIL/uL (ref 4.22–5.81)
RDW: 15.2 % (ref 11.5–15.5)
WBC: 8.1 10*3/uL (ref 4.0–10.5)
nRBC: 0 % (ref 0.0–0.2)

## 2018-12-25 LAB — URINALYSIS, ROUTINE W REFLEX MICROSCOPIC
Bilirubin Urine: NEGATIVE
Glucose, UA: NEGATIVE mg/dL
Hgb urine dipstick: NEGATIVE
Ketones, ur: NEGATIVE mg/dL
Leukocytes, UA: NEGATIVE
Nitrite: NEGATIVE
Protein, ur: NEGATIVE mg/dL
Specific Gravity, Urine: 1.021 (ref 1.005–1.030)
pH: 5 (ref 5.0–8.0)

## 2018-12-25 LAB — BASIC METABOLIC PANEL
Anion gap: 10 (ref 5–15)
BUN: 32 mg/dL — ABNORMAL HIGH (ref 8–23)
CO2: 22 mmol/L (ref 22–32)
Calcium: 8.3 mg/dL — ABNORMAL LOW (ref 8.9–10.3)
Chloride: 96 mmol/L — ABNORMAL LOW (ref 98–111)
Creatinine, Ser: 0.88 mg/dL (ref 0.61–1.24)
GFR calc Af Amer: >60 mL/min (ref >60)
GFR calc non Af Amer: >60 mL/min (ref >60)
Glucose, Bld: 171 mg/dL — ABNORMAL HIGH (ref 70–99)
Potassium: 4.8 mmol/L (ref 3.5–5.1)
Sodium: 128 mmol/L — ABNORMAL LOW (ref 135–145)

## 2018-12-25 LAB — CBC
HCT: 34.6 % — ABNORMAL LOW (ref 39.0–52.0)
Hemoglobin: 10.7 g/dL — ABNORMAL LOW (ref 13.0–17.0)
MCH: 25.5 pg — AB (ref 26.0–34.0)
MCHC: 30.9 g/dL (ref 30.0–36.0)
MCV: 82.4 fL (ref 80.0–100.0)
PLATELETS: 213 10*3/uL (ref 150–400)
RBC: 4.2 MIL/uL — ABNORMAL LOW (ref 4.22–5.81)
RDW: 15.2 % (ref 11.5–15.5)
WBC: 6.6 10*3/uL (ref 4.0–10.5)
nRBC: 0 % (ref 0.0–0.2)

## 2018-12-25 LAB — I-STAT TROPONIN, ED: Troponin i, poc: 0.02 ng/mL (ref 0.00–0.08)

## 2018-12-25 LAB — I-STAT CG4 LACTIC ACID, ED: Lactic Acid, Venous: 1.48 mmol/L (ref 0.5–1.9)

## 2018-12-25 LAB — BRAIN NATRIURETIC PEPTIDE: B Natriuretic Peptide: 1266.8 pg/mL — ABNORMAL HIGH (ref 0.0–100.0)

## 2018-12-25 MED ORDER — VANCOMYCIN HCL 10 G IV SOLR
2500.0000 mg | Freq: Once | INTRAVENOUS | Status: AC
  Administered 2018-12-25: 2500 mg via INTRAVENOUS
  Filled 2018-12-25: qty 2500

## 2018-12-25 MED ORDER — HYDROCODONE-ACETAMINOPHEN 5-325 MG PO TABS
1.0000 | ORAL_TABLET | Freq: Once | ORAL | Status: AC
  Administered 2018-12-25: 1 via ORAL
  Filled 2018-12-25: qty 1

## 2018-12-25 MED ORDER — VANCOMYCIN HCL 10 G IV SOLR
1250.0000 mg | Freq: Two times a day (BID) | INTRAVENOUS | Status: DC
  Administered 2018-12-26 (×2): 1250 mg via INTRAVENOUS
  Filled 2018-12-25 (×4): qty 1250

## 2018-12-25 MED ORDER — IOPAMIDOL (ISOVUE-370) INJECTION 76%
INTRAVENOUS | Status: AC
  Filled 2018-12-25: qty 100

## 2018-12-25 MED ORDER — SODIUM CHLORIDE 0.9 % IV SOLN
2.0000 g | INTRAVENOUS | Status: DC
  Administered 2018-12-25 – 2018-12-26 (×2): 2 g via INTRAVENOUS
  Filled 2018-12-25 (×3): qty 20

## 2018-12-25 MED ORDER — VANCOMYCIN HCL IN DEXTROSE 1-5 GM/200ML-% IV SOLN: 1000.0000 mg | Freq: Once | INTRAVENOUS | Status: DC

## 2018-12-25 MED ORDER — IOPAMIDOL (ISOVUE-370) INJECTION 76%
100.0000 mL | Freq: Once | INTRAVENOUS | Status: AC | PRN
  Administered 2018-12-25: 100 mL via INTRAVENOUS

## 2018-12-25 NOTE — Telephone Encounter (Signed)
Called pt back and discussed SOB and swelling with pt. He states that he will get dressed and go to [facility] ER today and see hopefully they will start IV dieresis there today.

## 2018-12-25 NOTE — ED Provider Notes (Signed)
[facility] Provider Note   CSN: [id number] Arrival date & time: 12/25/18  1436     History   Chief Complaint Chief Complaint  Patient presents with  . Shortness of Breath    HPI [facility] is a 73 y.o. male.  HPI Patient has complex medical history with multiple medical comorbidities.  He presents today with increasing shortness of breath and weight gain over the past several days.  He reports also his legs, most specifically his right leg has gotten more swollen and red with a lot of fluid drainage.  He has not had a fever.  He reports he has had a lot of problems with pain in his right arm which was broken due to a fall about 2 weeks ago.  He is trying to wear a sling.  He reports the problems with pain and positioning have worsened his ability to move about.  He reports he has been trying to sleep in a recliner and has not been able to lie flat.  He reports also has been having trouble elevating his legs.  He reports he has had a significant weight gain over the past several weeks.  Patient does not smoke or drink alcohol.  He had contacted his cardiologist office earlier and was advised to come to the emergency department. Past Medical History:  Diagnosis Date  . Acute on chronic systolic congestive heart failure ([place]) 06/21/2016  . AICD (automatic cardioverter/defibrillator) present    a. 06/21/2016 SJM single lead AICD (ser # [id number]).  . Benign neoplasm of colon   . Candida rash of groin 02/12/2016  . Cardiomyopathy ([place]) 02/04/2016  . Cellulitis of right leg 02/17/2018  . Chest pain    From rib fractures  . CHF (congestive heart failure) ([place]) 02/17/2018  . Chronic combined systolic and diastolic CHF (congestive heart failure) ([place])    a. 01/2016 Echo: EF 20-25%, diff HK; b. 05/2016 Echo: EF 35-40%, diff HK, gr1 DD, diff HK, mild MR, mod dil LA/RA, mod TR, PASP 76mmHg.  [name] Chronic combined systolic and diastolic CHF, NYHA class 2 ([place])    a.  01/2016 Echo: EF 20-25%, diff HK; b. 05/2016 Echo: EF 35-40%, diff HK, gr1 DD, diff HK, mild MR, mod dil LA/RA, mod TR, PASP 79mmHg.  . Diabetes mellitus type 2, controlled, with complications ([place])   . Edema 10/08/2014  . Encounter for long-term (current) use of medications 04/01/2017  . Essential hypertension   . Gout   . Hip fracture ([place]) 09/21/2014  . History of shingles 10/04/2013  . Hx of medication noncompliance 08/23/2017  . Hypertensive heart disease with heart failure ([place])   . Intertrochanteric fx-closed ([place]) 10/04/2014  . Lower extremity edema   . Morbid obesity ([place]) 10/04/2013  . Needs sleep apnea assessment 10/04/2013   Snoring, obesity, daytime somnolence    . NICM (nonischemic cardiomyopathy) ([place])     01/2016 Echo: EF 20-25%, b. 01/2016 Cath: LM nl, LAD min irregs, D1 50ost, LCX min irregs, RCA min irregs, RPDA/RPL1 nl;  c. 05/2016 Echo: EF 35-40%;  d. 06/2016 s/p SJM single lead AICD (ser # [id number])..  . Non-ischemic cardiomyopathy ([place]) 06/03/2016  . Non-obstructive CAD    a. 01/2016 Cath: LM nl, LAD min irregs, D1 50ost, LCX min irregs, RCA min irregs, RPDA/RPL1 nl.  . Physical deconditioning 02/28/2018  . Polyneuropathy in diabetes(357.2)   . Psychosexual dysfunction with inhibited sexual excitement   . Pure hypercholesterolemia   . Restless legs  syndrome (RLS)   . 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 Shortness of breath 01/18/2016  . Sleep apnea    "probably" (06/21/2016)  . Sleep arousal disorder   . Swelling of lower extremity 01/18/2016  . Syncope    a. Presumed to be 2/2 VT in setting of NICM-->s/p single lead SJM AICD in 06/2016.  [name] Syncope and collapse 06/03/2016  . Type II diabetes mellitus ([place])   . Unspecified venous (peripheral) insufficiency   . Varicose veins 10/04/2013  . Venous stasis dermatitis of both lower extremities 02/17/2018    Patient Active Problem List   Diagnosis Date Noted  . SOB (shortness of breath) 12/25/2018  . Physical  deconditioning 02/28/2018  . Venous stasis dermatitis of both lower extremities 02/17/2018  . CHF (congestive heart failure) ([place]) 02/17/2018  . Cellulitis of right leg 02/17/2018  . Hx of medication noncompliance 08/23/2017  . Encounter for long-term (current) use of medications 04/01/2017  . Syncope   . Type II diabetes mellitus ([place])   . Non-obstructive CAD   . NICM (nonischemic cardiomyopathy) ([place])   . Hypertensive heart disease with heart failure ([place])   . Chronic combined systolic and diastolic CHF, NYHA class 2 ([place])   . AICD (automatic cardioverter/defibrillator) present   . Acute on chronic systolic congestive heart failure ([place]) 06/21/2016  . Syncope and collapse 06/03/2016  . Non-ischemic cardiomyopathy ([place]) 06/03/2016  . Candida rash of groin 02/12/2016  . Cardiomyopathy ([place]) 02/04/2016  . Swelling of lower extremity 01/18/2016  . Shortness of breath 01/18/2016  . Edema 10/08/2014  . Intertrochanteric fx-closed ([place]) 10/04/2014  . S/P total hip arthroplasty 10/04/2014  . Diabetes mellitus type 2, controlled, with complications ([place])   . Pure hypercholesterolemia   . Essential hypertension   . Polyneuropathy in diabetes(357.2)   . Restless legs syndrome (RLS)   . Hip fracture ([place]) 09/21/2014  . Morbid obesity ([place]) 10/04/2013  . History of shingles 10/04/2013  . Varicose veins 10/04/2013  . Needs sleep apnea assessment 10/04/2013    Past Surgical History:  Procedure Laterality Date  . CARDIAC CATHETERIZATION N/A 02/12/2016   Procedure: Right/Left Heart Cath and Coronary Angiography;  Surgeon: Pixie Casino, MD;  Location: [facility];  Service: Cardiovascular;  Laterality: N/A;  . CATARACT EXTRACTION W/ INTRAOCULAR LENS  IMPLANT, BILATERAL Bilateral 2009  . EP IMPLANTABLE DEVICE N/A 06/21/2016   Procedure: ICD Implant;  Surgeon: Evans Lance, MD;  Location: [facility];  Service: Cardiovascular;  Laterality: N/A;  . FIXATION KYPHOPLASTY LUMBAR  SPINE  2008  . FRACTURE SURGERY    . INTRAMEDULLARY (IM) NAIL INTERTROCHANTERIC Left 09/21/2014   Procedure: left hip intertroch fracture;  Surgeon: [name], MD;  Location: [facility];  Service: Orthopedics;  Laterality: Left;  . LOWER EXTREMITY VENOUS DOPPLER  09/04/2012   Mild valvular insufficiency in the R Common Femoral vein w/ 1.6 sec of duration of refliux  . TRANSTHORACIC ECHOCARDIOGRAM  09/04/2012   EF >55%, normal        Home Medications    Prior to Admission medications   Medication Sig Start Date End Date Taking? Authorizing Provider  acetaminophen (TYLENOL) 500 MG tablet Take 500-1,000 mg by mouth every 8 (eight) hours as needed for mild pain.   Yes [provider]  aspirin EC 81 MG tablet Take 81 mg by mouth daily.   Yes [provider]  carvedilol (COREG) 3.125 MG tablet Take 1 tablet (3.125 mg  total) by mouth 2 (two) times daily with a meal. 05/04/18  Yes Hilty, Nadean Corwin, MD  docusate sodium (COLACE) 100 MG capsule Take 1 capsule (100 mg total) by mouth every 12 (twelve) hours. 12/12/18  Yes Jola Schmidt, MD  FLUoxetine (PROZAC) 20 MG tablet Take 20 mg by mouth daily.   Yes [provider]  furosemide (LASIX) 80 MG tablet Take 80 mg by mouth daily.  09/07/18  Yes [provider]  HYDROcodone-acetaminophen (NORCO/VICODIN) 5-325 MG tablet Take 1 tablet by mouth every 4 (four) hours as needed for moderate pain. 12/12/18  Yes Jola Schmidt, MD  Multiple Minerals-Vitamins (CAL MAG ZINC +D3) TABS Take 1 tablet by mouth at bedtime.    Yes [provider]  potassium chloride SA (K-DUR,KLOR-CON) 20 MEQ tablet TAKE 1 TABLET BY MOUTH TWICE DAILY Patient taking differently: Take 10-20 mEq by mouth 2 (two) times daily.  06/12/18  Yes Hilty, Nadean Corwin, MD  rOPINIRole (REQUIP) 4 MG tablet Take 4 mg by mouth at bedtime.    Yes [provider]  rosuvastatin (CRESTOR) 20 MG tablet Take 1 tablet (20 mg total) by mouth daily. 02/23/18  Yes Memon,  Jolaine Artist, MD  sacubitril-valsartan (ENTRESTO) 49-51 MG Take 1 tablet by mouth 2 (two) times daily. 06/29/18  Yes Hilty, Nadean Corwin, MD  HUMALOG MIX 75/25 (75-25) 100 UNIT/ML SUSP injection Inject 55-75 Units as directed 3 (three) times daily with meals. Inject 75 units into the skin before breakfast, 55 units before lunch, and 75 units before supper 05/03/15   [provider]    Family History Family History  Problem Relation Age of Onset  . Heart disease Mother   . Heart disease Father   . Heart disease Maternal Grandmother   . Heart disease Maternal Grandfather   . Stroke Paternal Grandmother   . Cancer Paternal Grandfather        Skin cancer  . Diabetes Brother     Social History Social History   Tobacco Use  . Smoking status: Never Smoker  . Smokeless tobacco: Never Used  Substance Use Topics  . Alcohol use: No  . Drug use: No     Allergies   Doxycycline; Lipitor [atorvastatin]; and Lyrica [pregabalin]   Review of Systems Review of Systems  10 Systems reviewed and are negative for acute change except as noted in the HPI.    Physical Exam Updated Vital Signs BP (!) 137/97   Pulse 97   Temp (!) 95.3 F (35.2 C) (Rectal)   Resp (!) 31   Ht 5\' 4"  (1.626 m)   Wt 136.1 kg   SpO2 96%   BMI 51.49 kg/m   Physical Exam Constitutional:      Comments: Patient is alert and nontoxic.  Mild increased work of breathing.  Morbid obesity.  Physical deconditioning  HENT:     Head: Normocephalic and atraumatic.  Cardiovascular:     Comments: Irregularly irregular.  No gross rub murmur gallop. Pulmonary:     Comments: Mild increased work of breathing.  Patient is supine position can appreciate some expiratory wheeze that sound like upper airway.  No gross rales or rhonchi to auscultation but patient does have significant thick chest wall. Large contusion to anterior chest wall. Abdominal:     Comments: Abdomen is obese.  Patient has Candida rash and induration of  pannus folds.  Musculoskeletal:     Comments: Bilateral edema 3+.  Irregular cobblestoning of pretibial surfaces.  Erythema extending onto the foot on  the right lower extremity.  See attached images.  Neurological:     General: No focal deficit present.     Mental Status: He is oriented to person, place, and time.     Coordination: Coordination normal.     Comments: His activities are significantly restricted by body habitus.  No focal neurologic deficit.  Mental status clear.  Psychiatric:        Mood and Affect: Mood normal.          ED Treatments / Results  Labs (all labs ordered are listed, but only abnormal results are displayed) Labs Reviewed  BASIC METABOLIC PANEL - Abnormal; Notable for the following components:      Result Value   Sodium 128 (*)    Chloride 96 (*)    Glucose, Bld 171 (*)    BUN 32 (*)    Calcium 8.3 (*)    All other components within normal limits  CBC - Abnormal; Notable for the following components:   RBC 4.20 (*)    Hemoglobin 10.7 (*)    HCT 34.6 (*)    MCH 25.5 (*)    All other components within normal limits  COMPREHENSIVE METABOLIC PANEL - Abnormal; Notable for the following components:   Sodium 128 (*)    Chloride 96 (*)    Glucose, Bld 167 (*)    BUN 30 (*)    Calcium 8.4 (*)    Albumin 3.3 (*)    All other components within normal limits  CBC WITH DIFFERENTIAL/PLATELET - Abnormal; Notable for the following components:   Hemoglobin 11.2 (*)    HCT 36.0 (*)    MCH 25.7 (*)    All other components within normal limits  BRAIN NATRIURETIC PEPTIDE - Abnormal; Notable for the following components:   B Natriuretic Peptide 1,266.8 (*)    All other components within normal limits  CULTURE, BLOOD (ROUTINE X 2)  CULTURE, BLOOD (ROUTINE X 2)  URINALYSIS, ROUTINE W REFLEX MICROSCOPIC  I-STAT TROPONIN, ED  I-STAT CG4 LACTIC ACID, ED  I-STAT CG4 LACTIC ACID, ED    EKG EKG Interpretation  Date/Time:  Monday December 25 2018 14:47:39  EST Ventricular Rate:  79 PR Interval:    QRS Duration: 106 QT Interval:  400 QTC Calculation: 458 R Axis:   -29 Text Interpretation:  Atrial fibrillation Anterior infarct , age undetermined Abnormal ECG similar to previous but lower voltage laterally Confirmed by Charlesetta Shanks [phone]) on 12/25/2018 11:18:33 PM   Radiology Dg Chest 2 View  Result Date: 12/25/2018 CLINICAL DATA:  Shortness of breath and fluid retention. EXAM: CHEST - 2 VIEW COMPARISON:  Chest x-ray dated June 27, 2018. FINDINGS: Unchanged left chest wall pacemaker. Stable cardiomegaly. Normal pulmonary vascularity. Low lung volumes. No focal consolidation, pleural effusion, or pneumothorax. No acute osseous abnormality. IMPRESSION: 1. Stable cardiomegaly.  No active cardiopulmonary disease. Electronically Signed   By: Titus Dubin M.D.   On: 12/25/2018 16:05   Ct Angio Chest Pe W/cm &/or Wo Cm  Result Date: 12/25/2018 CLINICAL DATA:  Bilateral leg swelling with shortness of breath EXAM: CT ANGIOGRAPHY CHEST WITH CONTRAST TECHNIQUE: Multidetector CT imaging of the chest was performed using the standard protocol during bolus administration of intravenous contrast. Multiplanar CT image reconstructions and MIPs were obtained to evaluate the vascular anatomy. CONTRAST:  94 mL ISOVUE-370 IOPAMIDOL (ISOVUE-370) INJECTION 76% COMPARISON:  Chest radiograph 12/25/2018, CT chest 08/01/2010 FINDINGS: Cardiovascular: Motion degradation limits evaluation for acute distal PE. No acute filling defects within the  central or proximal segmental vessels to suggest acute embolus. Mild cardiomegaly. No significant pericardial effusion. Nonaneurysmal aorta. Coronary vascular calcification. Mediastinum/Nodes: Midline trachea. No thyroid mass. No significant adenopathy. Small hiatal hernia Lungs/Pleura: Lungs are clear. No pleural effusion or pneumothorax. Upper Abdomen: No acute abnormality. Musculoskeletal: Degenerative changes. No acute or suspicious  abnormality Review of the MIP images confirms the above findings. IMPRESSION: 1. Limited evaluation for distal emboli due to motion degradation. No acute central PE is seen. 2. Mild cardiomegaly 3. Clear lung fields Electronically Signed   By: Donavan Foil M.D.   On: 12/25/2018 22:53    Procedures Procedures (including critical care time) CRITICAL CARE Performed by: Charlesetta Shanks   Total critical care time: 30 minutes  Critical care time was exclusive of separately billable procedures and treating other patients.  Critical care was necessary to treat or prevent imminent or life-threatening deterioration.  Critical care was time spent personally by me on the following activities: development of treatment plan with patient and/or surrogate as well as nursing, discussions with consultants, evaluation of patient's response to treatment, examination of patient, obtaining history from patient or surrogate, ordering and performing treatments and interventions, ordering and review of laboratory studies, ordering and review of radiographic studies, pulse oximetry and re-evaluation of patient's condition. Medications Ordered in ED Medications  cefTRIAXone (ROCEPHIN) 2 g in sodium chloride 0.9 % 100 mL IVPB (0 g Intravenous Stopped 12/25/18 2136)  vancomycin (VANCOCIN) 1,250 mg in sodium chloride 0.9 % 250 mL IVPB (has no administration in time range)  vancomycin (VANCOCIN) 2,500 mg in sodium chloride 0.9 % 500 mL IVPB (2,500 mg Intravenous New Bag/Given 12/25/18 2134)  HYDROcodone-acetaminophen (NORCO/VICODIN) 5-325 MG per tablet 1 tablet (1 tablet Oral Given 12/25/18 2135)  iopamidol (ISOVUE-370) 76 % injection 100 mL (100 mLs Intravenous Contrast Given 12/25/18 2213)     Initial Impression / Assessment and Plan / ED Course  I have reviewed the triage vital signs and the nursing notes.  Pertinent labs & imaging results that were available during my care of the patient were reviewed by me and considered  in my medical decision making (see chart for details).    Patient has multiple comorbid illnesses.  At this time, he has decreased mobility due to recent fracture.  Patient also had pre-existing risk of difficulty with mobility due to body habitus.  He has developed significant exacerbation of chronic venous stasis with what appears to be an acute cellulitis on chronic.  He now has redness of the entirety of the foot on the right.  There is a lot of active drainage from the lower leg.  This is as opposed to the left side.  Patient complains of shortness of breath.  CT scan obtained to rule out PE.  No evident PE present.  No gross pneumonia.  Some element of shortness of breath may be due to body habitus and hypoventilation.  However, EKG shows atrial fibrillation which at this point, I have not identified as a pre-existing condition on the chart.  He does not take anticoagulants.  Patient has known cardiomyopathy.  Plan for admission for lower extremity cellulitis acutely on chronic severe venous stasis.  Further evaluation for atrial fibrillation to determine if this is new versus chronic. Septic protocol initiated.  Patient does have appearance of cellulitis acutely on chronic venous stasis.  He has mild hypothermia.  Lactic acid however not elevated.  Blood pressures are stable.  Mild elevation of heart rate appears to be due to atrial  fibrillation.  No leukocytosis. And with patient having history of significant heart failure and cardiomyopathy, I did not feel that a fluid bolus was indicated at this time.  This can continue to be watched by admitting hospitalist for indication of fluid bolusing.  Final Clinical Impressions(s) / ED Diagnoses   Final diagnoses:  Cellulitis of right lower extremity  Atrial fibrillation, unspecified type ([place])  Severe comorbid illness  Shortness of breath    ED Discharge Orders    None       Charlesetta Shanks, MD 12/26/18 [phone]

## 2018-12-25 NOTE — Telephone Encounter (Signed)
New message   Pt c/o swelling: STAT is pt has developed SOB within 24 hours  1) How much weight have you gained and in what time span? Within a month, 20 lbs   2) If swelling, where is the swelling located? Legs   3) Are you currently taking a fluid pill? Yes   4) Are you currently SOB? No   5) Do you have a log of your daily weights (if so, list)? Patient states that has not kept a log recently   6) Have you gained 3 pounds in a day or 5 pounds in a week?yes   7) Have you traveled recently? No    Patient states that he feels that he needs to be admitted into the hospital and he wants to be admitted in [facility].

## 2018-12-25 NOTE — Telephone Encounter (Signed)
Returned call to patient of Dr. Debara Pickett. He reports weight gain of 20lbs over the last month and swelling in legs and weeping from LE. He also reports SOB. He is unable to prop legs up as well as he broke his right arm which he needs for mobility/stability.  He reports he has NOT been taking diuretics as diligently as he should. He reports yesterday he was over 300lbs.   He is calling to request an admission to [facility]. He reports he lives in [place] and EMS there will NOT send him to [facility] unless he is having a heart attack or stroke. He does not want to go to [facility] either.   There are notes in Epic concerning his ICD transmissions as well & fluid accumulation.  Will route to Dr. Debara Pickett for request for direct admission.

## 2018-12-25 NOTE — Telephone Encounter (Signed)
Will need urgent visit to evaluate for direct admission - or direct to ER.  Dr. Lemmie Evens

## 2018-12-25 NOTE — Telephone Encounter (Signed)
Patient advised of MD recommendation. He was added on to see K. Lawrence DNP on 1/7 @ 2pm for evaluation of symptoms and potential direct admit.

## 2018-12-25 NOTE — ED Notes (Signed)
Pt given ice chips

## 2018-12-25 NOTE — ED Triage Notes (Signed)
Onset 1-2 months ago developed shortness of breath and weight gain sent to ED for evaluation by cardiologist.

## 2018-12-25 NOTE — Progress Notes (Signed)
Pharmacy Antibiotic Note  [facility] is a 73 y.o. male admitted on 12/25/2018 with concern for cellulitis. Pharmacy has been consulted for Vancomycin dosing.   Vancomycin 1250 mg IV Q 12 hrs. Goal AUC 400-550. Expected AUC: 452.7 SCr used: 0.88  Plan: - Vancomycin 2500 mg IV x 1 followed by 1250 mg IV every 12 hours - Continue Rocephin 2g IV every 24 hours per MD - Will continue to follow renal function, culture results, LOT, and antibiotic de-escalation plans   Height: 5\' 4"  (162.6 cm) Weight: 300 lb (136.1 kg) IBW/kg (Calculated) : 59.2  Temp (24hrs), Avg:96.5 F (35.8 C), Min:95.3 F (35.2 C), Max:97.6 F (36.4 C)  Recent Labs  Lab 12/25/18 1511 12/25/18 1937  WBC 6.6  --   CREATININE 0.88  --   LATICACIDVEN  --  1.48    Estimated Creatinine Clearance: 95.2 mL/min (by C-G formula based on SCr of 0.88 mg/dL).    Allergies  Allergen Reactions  . Doxycycline Other (See Comments)    RECTAL BLEEDING  . Lipitor [Atorvastatin] Swelling    Ankles swell  . Lyrica [Pregabalin] Swelling    Ankles swell    Antimicrobials this admission: Vanc 1/6 >> Rocephin 1/6 >>  Dose adjustments this admission: n/a  Microbiology results: 1/6 BCx >>  Thank you for allowing pharmacy to be a part of this patient's care.  Alycia Rossetti, PharmD, BCPS Clinical Pharmacist Please check AMION for all [place] numbers 12/25/2018 7:53 PM

## 2018-12-26 ENCOUNTER — Ambulatory Visit (HOSPITAL_BASED_OUTPATIENT_CLINIC_OR_DEPARTMENT_OTHER): Payer: Medicare Other

## 2018-12-26 ENCOUNTER — Ambulatory Visit: Payer: Medicare Other | Admitting: Adult Health

## 2018-12-26 DIAGNOSIS — E871 Hypo-osmolality and hyponatremia: Secondary | ICD-10-CM

## 2018-12-26 DIAGNOSIS — I361 Nonrheumatic tricuspid (valve) insufficiency: Secondary | ICD-10-CM

## 2018-12-26 DIAGNOSIS — G8929 Other chronic pain: Secondary | ICD-10-CM

## 2018-12-26 DIAGNOSIS — D649 Anemia, unspecified: Secondary | ICD-10-CM

## 2018-12-26 DIAGNOSIS — S42309A Unspecified fracture of shaft of humerus, unspecified arm, initial encounter for closed fracture: Secondary | ICD-10-CM

## 2018-12-26 DIAGNOSIS — I4891 Unspecified atrial fibrillation: Secondary | ICD-10-CM | POA: Diagnosis not present

## 2018-12-26 DIAGNOSIS — L0390 Cellulitis, unspecified: Secondary | ICD-10-CM

## 2018-12-26 DIAGNOSIS — R0609 Other forms of dyspnea: Secondary | ICD-10-CM

## 2018-12-26 DIAGNOSIS — E785 Hyperlipidemia, unspecified: Secondary | ICD-10-CM

## 2018-12-26 LAB — BASIC METABOLIC PANEL
Anion gap: 13 (ref 5–15)
BUN: 30 mg/dL — ABNORMAL HIGH (ref 8–23)
CO2: 19 mmol/L — ABNORMAL LOW (ref 22–32)
Calcium: 8 mg/dL — ABNORMAL LOW (ref 8.9–10.3)
Chloride: 96 mmol/L — ABNORMAL LOW (ref 98–111)
Creatinine, Ser: 0.91 mg/dL (ref 0.61–1.24)
GFR calc Af Amer: >60 mL/min (ref >60)
GFR calc non Af Amer: >60 mL/min (ref >60)
Glucose, Bld: 200 mg/dL — ABNORMAL HIGH (ref 70–99)
POTASSIUM: 5 mmol/L (ref 3.5–5.1)
Sodium: 128 mmol/L — ABNORMAL LOW (ref 135–145)

## 2018-12-26 LAB — CBG MONITORING, ED
Glucose-Capillary: 182 mg/dL — ABNORMAL HIGH (ref 70–99)
Glucose-Capillary: 183 mg/dL — ABNORMAL HIGH (ref 70–99)

## 2018-12-26 LAB — ECHOCARDIOGRAM COMPLETE
Height: 65 in
WEIGHTICAEL: 4959.47 [oz_av]

## 2018-12-26 LAB — GLUCOSE, CAPILLARY: Glucose-Capillary: 195 mg/dL — ABNORMAL HIGH (ref 70–99)

## 2018-12-26 LAB — OSMOLALITY: Osmolality: 280 mOsm/kg (ref 275–295)

## 2018-12-26 LAB — TSH: TSH: 1.867 u[IU]/mL (ref 0.350–4.500)

## 2018-12-26 LAB — HEMOGLOBIN A1C
Hgb A1c MFr Bld: 7.6 % — ABNORMAL HIGH (ref 4.8–5.6)
Mean Plasma Glucose: 171.42 mg/dL

## 2018-12-26 LAB — HEPARIN LEVEL (UNFRACTIONATED): Heparin Unfractionated: 0.28 IU/mL — ABNORMAL LOW (ref 0.30–0.70)

## 2018-12-26 MED ORDER — HYDROCERIN EX CREA
TOPICAL_CREAM | Freq: Two times a day (BID) | CUTANEOUS | Status: DC
  Administered 2018-12-26: 1 via TOPICAL
  Administered 2018-12-26 – 2018-12-28 (×4): via TOPICAL
  Administered 2018-12-28: 1 via TOPICAL
  Administered 2018-12-29 – 2019-01-03 (×11): via TOPICAL
  Filled 2018-12-26 (×3): qty 113

## 2018-12-26 MED ORDER — HYDROCODONE-ACETAMINOPHEN 5-325 MG PO TABS
1.0000 | ORAL_TABLET | ORAL | Status: DC | PRN
  Administered 2018-12-26 – 2019-01-02 (×16): 1 via ORAL
  Filled 2018-12-26 (×16): qty 1

## 2018-12-26 MED ORDER — ACETAMINOPHEN 500 MG PO TABS
500.0000 mg | ORAL_TABLET | Freq: Three times a day (TID) | ORAL | Status: DC | PRN
  Administered 2018-12-27 – 2019-01-03 (×3): 500 mg via ORAL
  Filled 2018-12-26 (×3): qty 1

## 2018-12-26 MED ORDER — INSULIN ASPART 100 UNIT/ML ~~LOC~~ SOLN
0.0000 [IU] | Freq: Three times a day (TID) | SUBCUTANEOUS | Status: DC
  Administered 2018-12-26 (×2): 4 [IU] via SUBCUTANEOUS
  Administered 2018-12-27 (×2): 3 [IU] via SUBCUTANEOUS
  Administered 2018-12-27 – 2018-12-29 (×2): 4 [IU] via SUBCUTANEOUS
  Administered 2018-12-29: 3 [IU] via SUBCUTANEOUS
  Administered 2018-12-30: 2 [IU] via SUBCUTANEOUS
  Administered 2018-12-30: 4 [IU] via SUBCUTANEOUS
  Administered 2018-12-30 – 2019-01-01 (×5): 3 [IU] via SUBCUTANEOUS
  Administered 2019-01-01: 4 [IU] via SUBCUTANEOUS
  Administered 2019-01-02 – 2019-01-03 (×3): 3 [IU] via SUBCUTANEOUS

## 2018-12-26 MED ORDER — ROPINIROLE HCL 1 MG PO TABS
4.0000 mg | ORAL_TABLET | Freq: Every day | ORAL | Status: DC
  Administered 2018-12-26 – 2019-01-02 (×9): 4 mg via ORAL
  Filled 2018-12-26 (×9): qty 4

## 2018-12-26 MED ORDER — DILTIAZEM HCL 100 MG IV SOLR: 5.0000 mg/h | INTRAVENOUS | Status: DC

## 2018-12-26 MED ORDER — DILTIAZEM HCL 100 MG IV SOLR
5.0000 mg/h | INTRAVENOUS | Status: DC
  Filled 2018-12-26 (×2): qty 100

## 2018-12-26 MED ORDER — ENOXAPARIN SODIUM 40 MG/0.4ML ~~LOC~~ SOLN: 40.0000 mg | Freq: Every day | SUBCUTANEOUS | Status: DC

## 2018-12-26 MED ORDER — SODIUM CHLORIDE 0.9 % IV BOLUS
1000.0000 mL | Freq: Once | INTRAVENOUS | Status: AC
  Administered 2018-12-26: 1000 mL via INTRAVENOUS

## 2018-12-26 MED ORDER — HEPARIN BOLUS VIA INFUSION
4000.0000 [IU] | Freq: Once | INTRAVENOUS | Status: AC
  Administered 2018-12-26: 4000 [IU] via INTRAVENOUS
  Filled 2018-12-26: qty 4000

## 2018-12-26 MED ORDER — METHOCARBAMOL 500 MG PO TABS
500.0000 mg | ORAL_TABLET | Freq: Four times a day (QID) | ORAL | Status: DC | PRN
  Administered 2018-12-27 – 2019-01-02 (×7): 500 mg via ORAL
  Filled 2018-12-26 (×7): qty 1

## 2018-12-26 MED ORDER — ASPIRIN EC 81 MG PO TBEC
81.0000 mg | DELAYED_RELEASE_TABLET | Freq: Every day | ORAL | Status: DC
  Administered 2018-12-26 – 2018-12-27 (×2): 81 mg via ORAL
  Filled 2018-12-26 (×2): qty 1

## 2018-12-26 MED ORDER — DILTIAZEM HCL-DEXTROSE 100-5 MG/100ML-% IV SOLN (PREMIX)
5.0000 mg/h | INTRAVENOUS | Status: DC
  Filled 2018-12-26: qty 100

## 2018-12-26 MED ORDER — ROSUVASTATIN CALCIUM 20 MG PO TABS
20.0000 mg | ORAL_TABLET | Freq: Every day | ORAL | Status: DC
  Administered 2018-12-26 – 2019-01-02 (×8): 20 mg via ORAL
  Filled 2018-12-26 (×9): qty 1

## 2018-12-26 MED ORDER — HEPARIN (PORCINE) 25000 UT/250ML-% IV SOLN
1500.0000 [IU]/h | INTRAVENOUS | Status: DC
  Administered 2018-12-26: 1500 [IU]/h via INTRAVENOUS
  Administered 2018-12-26: 1400 [IU]/h via INTRAVENOUS
  Filled 2018-12-26 (×2): qty 250

## 2018-12-26 MED ORDER — DOCUSATE SODIUM 100 MG PO CAPS
100.0000 mg | ORAL_CAPSULE | Freq: Two times a day (BID) | ORAL | Status: DC
  Administered 2018-12-26 – 2019-01-03 (×15): 100 mg via ORAL
  Filled 2018-12-26 (×17): qty 1

## 2018-12-26 MED ORDER — PERFLUTREN LIPID MICROSPHERE
1.0000 mL | INTRAVENOUS | Status: AC | PRN
  Administered 2018-12-26: 2 mL via INTRAVENOUS
  Filled 2018-12-26: qty 10

## 2018-12-26 MED ORDER — FLUOXETINE HCL 20 MG PO CAPS
20.0000 mg | ORAL_CAPSULE | Freq: Every day | ORAL | Status: DC
  Administered 2018-12-26 – 2019-01-03 (×9): 20 mg via ORAL
  Filled 2018-12-26 (×9): qty 1

## 2018-12-26 MED ORDER — CALCIUM CARBONATE-VITAMIN D 500-200 MG-UNIT PO TABS
1.0000 | ORAL_TABLET | Freq: Every day | ORAL | Status: DC
  Administered 2018-12-26 – 2019-01-02 (×8): 1 via ORAL
  Filled 2018-12-26 (×8): qty 1

## 2018-12-26 NOTE — Progress Notes (Signed)
  Echocardiogram 2D Echocardiogram has been performed.  Naksh Radi L Androw 12/26/2018, 2:35 PM

## 2018-12-26 NOTE — Progress Notes (Signed)
ANTICOAGULATION CONSULT NOTE - Initial Consult  Pharmacy Consult for Heparin Indication: atrial fibrillation  Allergies  Allergen Reactions  . Doxycycline Other (See Comments)    RECTAL BLEEDING  . Lipitor [Atorvastatin] Swelling    Ankles swell  . Lyrica [Pregabalin] Swelling    Ankles swell    Patient Measurements: Height: 5\' 4"  (162.6 cm) Weight: 300 lb (136.1 kg) IBW/kg (Calculated) : 59.2 Heparin Dosing Weight: 90 kg  Vital Signs: Temp: 95.3 F (35.2 C) (01/06 1909) Temp Source: Rectal (01/06 1909) BP: 121/73 (01/07 0600) Pulse Rate: 117 (01/07 0600)  Labs: Recent Labs    12/25/18 1511 12/25/18 1927  HGB 10.7* 11.2*  HCT 34.6* 36.0*  PLT 213 239  CREATININE 0.88 0.80    Estimated Creatinine Clearance: 104.7 mL/min (by C-G formula based on SCr of 0.8 mg/dL).   Medical History: Past Medical History:  Diagnosis Date  . Acute on chronic systolic congestive heart failure ([place]) 06/21/2016  . AICD (automatic cardioverter/defibrillator) present    a. 06/21/2016 SJM single lead AICD (ser # [id number]).  . Benign neoplasm of colon   . Candida rash of groin 02/12/2016  . Cardiomyopathy ([place]) 02/04/2016  . Cellulitis of right leg 02/17/2018  . Chest pain    From rib fractures  . CHF (congestive heart failure) ([place]) 02/17/2018  . Chronic combined systolic and diastolic CHF (congestive heart failure) ([place])    a. 01/2016 Echo: EF 20-25%, diff HK; b. 05/2016 Echo: EF 35-40%, diff HK, gr1 DD, diff HK, mild MR, mod dil LA/RA, mod TR, PASP 49mmHg.  [name] Chronic combined systolic and diastolic CHF, NYHA class 2 ([place])    a. 01/2016 Echo: EF 20-25%, diff HK; b. 05/2016 Echo: EF 35-40%, diff HK, gr1 DD, diff HK, mild MR, mod dil LA/RA, mod TR, PASP 69mmHg.  . Diabetes mellitus type 2, controlled, with complications ([place])   . Edema 10/08/2014  . Encounter for long-term (current) use of medications 04/01/2017  . Essential hypertension   . Gout   . Hip fracture ([place]) 09/21/2014  . History of  shingles 10/04/2013  . Hx of medication noncompliance 08/23/2017  . Hypertensive heart disease with heart failure ([place])   . Intertrochanteric fx-closed ([place]) 10/04/2014  . Lower extremity edema   . Morbid obesity ([place]) 10/04/2013  . Needs sleep apnea assessment 10/04/2013   Snoring, obesity, daytime somnolence    . NICM (nonischemic cardiomyopathy) ([place])     01/2016 Echo: EF 20-25%, b. 01/2016 Cath: LM nl, LAD min irregs, D1 50ost, LCX min irregs, RCA min irregs, RPDA/RPL1 nl;  c. 05/2016 Echo: EF 35-40%;  d. 06/2016 s/p SJM single lead AICD (ser # [id number])..  . Non-ischemic cardiomyopathy ([place]) 06/03/2016  . Non-obstructive CAD    a. 01/2016 Cath: LM nl, LAD min irregs, D1 50ost, LCX min irregs, RCA min irregs, RPDA/RPL1 nl.  . Physical deconditioning 02/28/2018  . Polyneuropathy in diabetes(357.2)   . Psychosexual dysfunction with inhibited sexual excitement   . Pure hypercholesterolemia   . Restless legs syndrome (RLS)   . 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 Shortness of breath 01/18/2016  . Sleep apnea    "probably" (06/21/2016)  . Sleep arousal disorder   . Swelling of lower extremity 01/18/2016  . Syncope    a. Presumed to be 2/2 VT in setting of NICM-->s/p single lead SJM AICD in 06/2016.  [name] Syncope and collapse 06/03/2016  . Type II diabetes mellitus ([place])   . Unspecified venous (peripheral) insufficiency   .  Varicose veins 10/04/2013  . Venous stasis dermatitis of both lower extremities 02/17/2018    Medications:  No current facility-administered medications on file prior to encounter.    Current Outpatient Medications on File Prior to Encounter  Medication Sig Dispense Refill  . acetaminophen (TYLENOL) 500 MG tablet Take 500-1,000 mg by mouth every 8 (eight) hours as needed for mild pain.    [name] aspirin EC 81 MG tablet Take 81 mg by mouth daily.    . carvedilol (COREG) 3.125 MG tablet Take 1 tablet (3.125 mg total) by mouth 2 (two) times daily with a meal. 180 tablet 2   . docusate sodium (COLACE) 100 MG capsule Take 1 capsule (100 mg total) by mouth every 12 (twelve) hours. 60 capsule 0  . FLUoxetine (PROZAC) 20 MG tablet Take 20 mg by mouth daily.    . furosemide (LASIX) 80 MG tablet Take 80 mg by mouth daily.   5  . HYDROcodone-acetaminophen (NORCO/VICODIN) 5-325 MG tablet Take 1 tablet by mouth every 4 (four) hours as needed for moderate pain. 15 tablet 0  . Multiple Minerals-Vitamins (CAL MAG ZINC +D3) TABS Take 1 tablet by mouth at bedtime.     . potassium chloride SA (K-DUR,KLOR-CON) 20 MEQ tablet TAKE 1 TABLET BY MOUTH TWICE DAILY (Patient taking differently: Take 10-20 mEq by mouth 2 (two) times daily. ) 180 tablet 3  . rOPINIRole (REQUIP) 4 MG tablet Take 4 mg by mouth at bedtime.     . rosuvastatin (CRESTOR) 20 MG tablet Take 1 tablet (20 mg total) by mouth daily. 30 tablet 0  . sacubitril-valsartan (ENTRESTO) 49-51 MG Take 1 tablet by mouth 2 (two) times daily. 60 tablet 3  . HUMALOG MIX 75/25 (75-25) 100 UNIT/ML SUSP injection Inject 55-75 Units as directed 3 (three) times daily with meals. Inject 75 units into the skin before breakfast, 55 units before lunch, and 75 units before supper  2     Assessment: 73 y.o. male with new onset Afib for heparin Goal of Therapy:  Heparin level 0.3-0.7 units/ml Monitor platelets by anticoagulation protocol: Yes   Plan:  Heparin 4000 units IV bolus, then start heparin 1400 units/hr Check heparin level in 6 hours.   Jamont Mellin, [place] 12/26/2018,6:51 AM

## 2018-12-26 NOTE — Plan of Care (Signed)
  Problem: Education: Goal: Knowledge of General Education information will improve Description Including pain rating scale, medication(s)/side effects and non-pharmacologic comfort measures Outcome: Progressing   Problem: Health Behavior/Discharge Planning: Goal: Ability to manage health-related needs will improve Outcome: Progressing   Problem: Clinical Measurements: Goal: Will remain free from infection Outcome: Progressing   Problem: Clinical Measurements: Goal: Cardiovascular complication will be avoided Outcome: Progressing   

## 2018-12-26 NOTE — Evaluation (Signed)
Physical Therapy Evaluation Patient Details Name: Jeffery Nelson MRN: [id number] DOB: 1946/12/19 Today's Date: 12/26/2018   History of Present Illness  Patient is a 73 y/o male who was sent from his cardiologist due to SOB and weight gain. Found to have new onset A-fib and RLE cellulitis. Recent RUE fx on 12/24 conservative management in sling. PMH includes hip replacement 2015, DM, HTN, CHF, defibrillator, syncope, venous stasis BLEss, presents with SOB and   Clinical Impression  Patient presents with generalized weakness, dyspnea on exertion, impaired activity tolerance and impaired mobility s/p above. Pt with recent RUE fx on Christmas eve, now NWB in sling. Pt lives alone and independent PTA, however having increased difficulty since arm fx. Reports falls at home- mostly related to sliding out of bed or falling asleep sitting up resulting in falling. Pt drives and uses RW vs rollator for ambulation. Requires Mod A for bed mobility and Min A for standing and to take a few steps along side the bed. Pt in A-fib throughout 80s-100s bpm. Fatigues easily. Would benefit from SNF to maximize independence and mobility prior to return home.     Follow Up Recommendations SNF    Equipment Recommendations  None recommended by PT    Recommendations for Other Services       Precautions / Restrictions Precautions Precautions: Fall Precaution Comments: watch HR, A-fib Required Braces or Orthoses: Sling Restrictions Weight Bearing Restrictions: Yes RUE Weight Bearing: Non weight bearing Other Position/Activity Restrictions: per pt from orthopedic MD      Mobility  Bed Mobility Overal bed mobility: Needs Assistance Bed Mobility: Supine to Sit;Sit to Supine     Supine to sit: Mod assist;HOB elevated Sit to supine: Mod assist   General bed mobility comments: Assist with LEs, bottom and to elevate trunk as pt cannot use RUE for transfer. Assist to bring LEs into bed- cannot tolerate laying  flat.  Transfers Overall transfer level: Needs assistance Equipment used: Rolling walker (2 wheeled) Transfers: Sit to/from Stand Sit to Stand: Min assist         General transfer comment: Assist to power to standing with cues for momentum and anterior weight shift.   Ambulation/Gait Ambulation/Gait assistance: Min assist Gait Distance (Feet): 4 Feet Assistive device: Rolling walker (2 wheeled) Gait Pattern/deviations: Step-to pattern;Wide base of support Gait velocity: decreased   General Gait Details: Able to side step along side the bed with Min A for balance. Fatigues quickly. 2/4 DOE. HR in A-fib.   Stairs            Wheelchair Mobility    Modified Rankin (Stroke Patients Only)       Balance Overall balance assessment: Needs assistance;History of Falls Sitting-balance support: Feet supported;Single extremity supported Sitting balance-Leahy Scale: Poor Sitting balance - Comments: Requires UE support initially progressing to no UE support, close min guard.    Standing balance support: Single extremity supported Standing balance-Leahy Scale: Poor Standing balance comment: Requires 1 UE support in standing for dynamic tasks.                             Pertinent Vitals/Pain Pain Assessment: No/denies pain    Home Living Family/patient expects to be discharged to:: Skilled nursing facility Living Arrangements: Alone Available Help at Discharge: Family;Available PRN/intermittently Type of Home: Mobile home Home Access: Ramped entrance     Home Layout: One level Home Equipment: [place] - 2 wheels;Walker - 4 wheels  Prior Function Level of Independence: Independent with assistive device(s)         Comments: uses RW PRN for household ambulation and rollator for community ambulation. Normally independent for ADLs but has had trouble since RUE fx in December, Sister helps with a lot recently. Drives prior to fx as well. Reports episodes of  falling asleep sitting up and sliding onto floor- awaiting sleep study.     Hand Dominance   Dominant Hand: Right    Extremity/Trunk Assessment   Upper Extremity Assessment Upper Extremity Assessment: Defer to OT evaluation(RUE in sling, wrist and digit AROm WFL.)    Lower Extremity Assessment Lower Extremity Assessment: RLE deficits/detail;LLE deficits/detail;Generalized weakness RLE Deficits / Details: Swelling present throughout and into foot with weeping, chronic venous stasis RLE Sensation: decreased light touch LLE Deficits / Details: Swelling present throughout and into foot, some weeping, chronic venous stasis LLE Sensation: decreased light touch       Communication   Communication: No difficulties  Cognition Arousal/Alertness: Awake/alert Behavior During Therapy: WFL for tasks assessed/performed Overall Cognitive Status: Within Functional Limits for tasks assessed                                 General Comments: Fallinga sleep mid sentence initially- reports this happens sometimes. Did better with arousal once sitting EOB. Difficult historian- likes to go on tangents unrelated to questions asked. Seems to be baseline.       General Comments General comments (skin integrity, edema, etc.): Sister present during session.    Exercises     Assessment/Plan    PT Assessment Patient needs continued PT services  PT Problem List Decreased strength;Decreased balance;Decreased cognition;Obesity;Cardiopulmonary status limiting activity;Decreased mobility;Decreased activity tolerance;Decreased skin integrity       PT Treatment Interventions Functional mobility training;Balance training;Patient/family education;Gait training;Therapeutic activities;Therapeutic exercise    PT Goals (Current goals can be found in the Care Plan section)  Acute Rehab PT Goals Patient Stated Goal: to go to rehab to get stronger PT Goal Formulation: With patient Time For Goal  Achievement: 01/09/19 Potential to Achieve Goals: Good    Frequency Min 2X/week   Barriers to discharge Decreased caregiver support lives alone    Co-evaluation               AM-PAC PT "6 Clicks" Mobility  Outcome Measure Help needed turning from your back to your side while in a flat bed without using bedrails?: A Lot Help needed moving from lying on your back to sitting on the side of a flat bed without using bedrails?: A Lot Help needed moving to and from a bed to a chair (including a wheelchair)?: A Lot Help needed standing up from a chair using your arms (e.g., wheelchair or bedside chair)?: A Little Help needed to walk in hospital room?: A Little Help needed climbing 3-5 steps with a railing? : A Lot 6 Click Score: 14    End of Session   Activity Tolerance: Patient limited by fatigue Patient left: in bed;with call bell/phone within reach;with bed alarm set;with family/visitor present Nurse Communication: Mobility status PT Visit Diagnosis: Muscle weakness (generalized) (M62.81);Unsteadiness on feet (R26.81);Difficulty in walking, not elsewhere classified (R26.2)    Time: 7106-2694 PT Time Calculation (min) (ACUTE ONLY): 36 min   Charges:   PT Evaluation $PT Eval Moderate Complexity: 1 Mod PT Treatments $Therapeutic Activity: 8-22 mins        Wray Kearns, PT, DPT  Acute Rehabilitation Services Pager [phone] Office [place] 12/26/2018, 4:12 PM

## 2018-12-26 NOTE — Progress Notes (Addendum)
Jeffery Nelson is a 73 y.o. 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 Admitted for cellulitis of the lower extremities and atrial fib with RVR.    Plan: Continue with IV antibiotics.  Rate control with IV cardizem.  Cardiology will be consulted in am. Patient sees Dr Debara Pickett as outpatient.    [name], MD [phone]

## 2018-12-26 NOTE — Progress Notes (Signed)
[facility] for Heparin Indication: atrial fibrillation  Allergies  Allergen Reactions  . Doxycycline Other (See Comments)    RECTAL BLEEDING  . Lipitor [Atorvastatin] Swelling    Ankles swell  . Lyrica [Pregabalin] Swelling    Ankles swell    Patient Measurements: Height: 5\' 5"  (165.1 cm) Weight: (!) 309 lb 15.5 oz (140.6 kg) IBW/kg (Calculated) : 61.5 Heparin Dosing Weight: 90 kg  Vital Signs: Temp: 97.9 F (36.6 C) (01/07 1304) Temp Source: Oral (01/07 1304) BP: 93/67 (01/07 1304) Pulse Rate: 77 (01/07 1200)  Labs: Recent Labs    12/25/18 1511 12/25/18 1927 12/26/18 0757 12/26/18 1311  HGB 10.7* 11.2*  --   --   HCT 34.6* 36.0*  --   --   PLT 213 239  --   --   HEPARINUNFRC  --   --   --  0.28*  CREATININE 0.88 0.80 0.91  --     Estimated Creatinine Clearance: 95.2 mL/min (by C-G formula based on SCr of 0.91 mg/dL).   Medical History: Past Medical History:  Diagnosis Date  . Acute on chronic systolic congestive heart failure ([place]) 06/21/2016  . AICD (automatic cardioverter/defibrillator) present    a. 06/21/2016 SJM single lead AICD (ser # [id number]).  . Benign neoplasm of colon   . Candida rash of groin 02/12/2016  . Cardiomyopathy ([place]) 02/04/2016  . Cellulitis of right leg 02/17/2018  . Chest pain    From rib fractures  . CHF (congestive heart failure) ([place]) 02/17/2018  . Chronic combined systolic and diastolic CHF (congestive heart failure) ([place])    a. 01/2016 Echo: EF 20-25%, diff HK; b. 05/2016 Echo: EF 35-40%, diff HK, gr1 DD, diff HK, mild MR, mod dil LA/RA, mod TR, PASP 53mmHg.  [name] Chronic combined systolic and diastolic CHF, NYHA class 2 ([place])    a. 01/2016 Echo: EF 20-25%, diff HK; b. 05/2016 Echo: EF 35-40%, diff HK, gr1 DD, diff HK, mild MR, mod dil LA/RA, mod TR, PASP 55mmHg.  . Diabetes mellitus type 2, controlled, with complications ([place])   . Edema 10/08/2014  . Encounter for long-term (current) use of medications  04/01/2017  . Essential hypertension   . Gout   . Hip fracture ([place]) 09/21/2014  . History of shingles 10/04/2013  . Hx of medication noncompliance 08/23/2017  . Hypertensive heart disease with heart failure ([place])   . Intertrochanteric fx-closed ([place]) 10/04/2014  . Lower extremity edema   . Morbid obesity ([place]) 10/04/2013  . Needs sleep apnea assessment 10/04/2013   Snoring, obesity, daytime somnolence    . NICM (nonischemic cardiomyopathy) ([place])     01/2016 Echo: EF 20-25%, b. 01/2016 Cath: LM nl, LAD min irregs, D1 50ost, LCX min irregs, RCA min irregs, RPDA/RPL1 nl;  c. 05/2016 Echo: EF 35-40%;  d. 06/2016 s/p SJM single lead AICD (ser # [id number])..  . Non-ischemic cardiomyopathy ([place]) 06/03/2016  . Non-obstructive CAD    a. 01/2016 Cath: LM nl, LAD min irregs, D1 50ost, LCX min irregs, RCA min irregs, RPDA/RPL1 nl.  . Physical deconditioning 02/28/2018  . Polyneuropathy in diabetes(357.2)   . Psychosexual dysfunction with inhibited sexual excitement   . Pure hypercholesterolemia   . Restless legs syndrome (RLS)   . 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 Shortness of breath 01/18/2016  . Sleep apnea    "probably" (06/21/2016)  . Sleep arousal disorder   . Swelling of lower extremity 01/18/2016  . Syncope  a. Presumed to be 2/2 VT in setting of NICM-->s/p single lead SJM AICD in 06/2016.  [name] Syncope and collapse 06/03/2016  . Type II diabetes mellitus ([place])   . Unspecified venous (peripheral) insufficiency   . Varicose veins 10/04/2013  . Venous stasis dermatitis of both lower extremities 02/17/2018    Medications:  No current facility-administered medications on file prior to encounter.    Current Outpatient Medications on File Prior to Encounter  Medication Sig Dispense Refill  . acetaminophen (TYLENOL) 500 MG tablet Take 500-1,000 mg by mouth every 8 (eight) hours as needed for mild pain.    [name] aspirin EC 81 MG tablet Take 81 mg by mouth daily.    . carvedilol (COREG) 3.125  MG tablet Take 1 tablet (3.125 mg total) by mouth 2 (two) times daily with a meal. 180 tablet 2  . docusate sodium (COLACE) 100 MG capsule Take 1 capsule (100 mg total) by mouth every 12 (twelve) hours. 60 capsule 0  . FLUoxetine (PROZAC) 20 MG tablet Take 20 mg by mouth daily.    . furosemide (LASIX) 80 MG tablet Take 80 mg by mouth daily.   5  . HYDROcodone-acetaminophen (NORCO/VICODIN) 5-325 MG tablet Take 1 tablet by mouth every 4 (four) hours as needed for moderate pain. 15 tablet 0  . Multiple Minerals-Vitamins (CAL MAG ZINC +D3) TABS Take 1 tablet by mouth at bedtime.     . potassium chloride SA (K-DUR,KLOR-CON) 20 MEQ tablet TAKE 1 TABLET BY MOUTH TWICE DAILY (Patient taking differently: Take 10-20 mEq by mouth 2 (two) times daily. ) 180 tablet 3  . rOPINIRole (REQUIP) 4 MG tablet Take 4 mg by mouth at bedtime.     . rosuvastatin (CRESTOR) 20 MG tablet Take 1 tablet (20 mg total) by mouth daily. 30 tablet 0  . sacubitril-valsartan (ENTRESTO) 49-51 MG Take 1 tablet by mouth 2 (two) times daily. 60 tablet 3  . HUMALOG MIX 75/25 (75-25) 100 UNIT/ML SUSP injection Inject 55-75 Units as directed 3 (three) times daily with meals. Inject 75 units into the skin before breakfast, 55 units before lunch, and 75 units before supper  2     Assessment: 73 y.o. male with new onset Afib for heparin -initial heparin level= 0.28  Goal of Therapy:  Heparin level 0.3-0.7 units/ml Monitor platelets by anticoagulation protocol: Yes   Plan:  -Increase heparin to 1500 units/hr -Daily heparin level and CBC -Will follow oral anticoagulation plans  Hildred Laser, PharmD Clinical Pharmacist **Pharmacist phone directory can now be found on [email or web address] ([email or web address]).  Listed under [place].

## 2018-12-26 NOTE — Consult Note (Signed)
[facility] Nurse wound consult note Reason for Consult: Chronic lymphedema/venous insufficiency, RLE is > than LLE. Hemosiderin staining, edema, peau d' orange presentation and broken, likely fungal nails (onychomycosis). Callous formation on heels and toes. Has been seen in the past by the [facility] outpatient [facility] for lymphedema pumps and garments, but is not currently utilizing them. Clinical CEAP 4.  He was last seen by my partner, D. Barbie Haggis on February 17, 2018.  See her note from that encounter. Wound type: venous insufficiency, lymphedema Pressure Injury POA: N/A Measurement: No open wounds Wound bed: N/A Drainage (amount, consistency, odor) None Periwound: N/A Dressing procedure/placement/frequency: I have provided guidance via the Orders for topical care of LEs (cleansing, application of emollient, wrapping and flotation of heels to prevent pressure injury while in house).  Patient should follow up with the outpatient care provider of his choosing for LE health and maintenance. Suggest reestablishing with the [facility] outpatient [facility] for this purpose. He is also in need of podiatry and the provision of orthotics.   He is taught today that he should always wear protective shoe/foot wear while walking about the house and yard as any injury to the feet would be difficult to heal given his alteration in venous return/circulation.  [facility] nursing team will not follow, but will remain available to this patient, the nursing and medical teams.  Please re-consult if needed. Thanks, Maudie Flakes, MSN, RN, [place], Arther Abbott  Pager# [phone]

## 2018-12-26 NOTE — ED Notes (Signed)
Patient CBG was 182 the Nurse was informed.

## 2018-12-26 NOTE — ED Notes (Signed)
Attempted report 

## 2018-12-26 NOTE — H&P (Signed)
History and Physical    Jeffery Nelson:[id number] DOB: Nov 15, 1946 DOA: 12/25/2018  PCP: [name], MD  Chief Complaint: Sent here by his cardiologist for evaluation of shortness of breath and weight gain.  HPI: [name] is a 73 y.o. 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  Patient reports having chronic dyspnea on exertion but believes it has been worse recently.  Reports having chronic lower extremity edema which has been getting progressively worse.  States he has not been taking Lasix as he is supposed to.  Reports having fatigue.  Denies having any fevers or chills.  States he fractured his right arm on Christmas Eve and was seen by orthopedics but thought not to be a surgical candidate due to his heart condition.  Patient denies any prior history of asthma or COPD.  Denies any history of A. fib.  Wife at bedside states patient has chronic bilateral lower extremity edema and erythema with the right lower extremity chronically being larger in size compared to the left lower extremity.  Wife believes the erythema and fluid drainage from his right lower extremity have been worse recently.  ED Course: Found to be in A. Fib, not previously listed in his chart.  Started on antibiotics for right lower extremity cellulitis.  Review of Systems: As per HPI otherwise 10 point review of systems negative.  Past Medical History:  Diagnosis Date  . Acute on chronic systolic congestive heart failure ([place]) 06/21/2016  . AICD (automatic cardioverter/defibrillator) present    a. 06/21/2016 SJM single lead AICD (ser # [id number]).  . Benign neoplasm of colon   . Candida rash of groin 02/12/2016  . Cardiomyopathy ([place]) 02/04/2016  . Cellulitis of right leg  02/17/2018  . Chest pain    From rib fractures  . CHF (congestive heart failure) ([place]) 02/17/2018  . Chronic combined systolic and diastolic CHF (congestive heart failure) ([place])    a. 01/2016 Echo: EF 20-25%, diff HK; b. 05/2016 Echo: EF 35-40%, diff HK, gr1 DD, diff HK, mild MR, mod dil LA/RA, mod TR, PASP 57mmHg.  [name] Chronic combined systolic and diastolic CHF, NYHA class 2 ([place])    a. 01/2016 Echo: EF 20-25%, diff HK; b. 05/2016 Echo: EF 35-40%, diff HK, gr1 DD, diff HK, mild MR, mod dil LA/RA, mod TR, PASP 51mmHg.  . Diabetes mellitus type 2, controlled, with complications ([place])   . Edema 10/08/2014  . Encounter for long-term (current) use of medications 04/01/2017  . Essential hypertension   . Gout   . Hip fracture ([place]) 09/21/2014  . History of shingles 10/04/2013  . Hx of medication noncompliance 08/23/2017  . Hypertensive heart disease with heart failure ([place])   . Intertrochanteric fx-closed ([place]) 10/04/2014  . Lower extremity edema   . Morbid obesity ([place]) 10/04/2013  . Needs sleep apnea assessment 10/04/2013   Snoring, obesity, daytime somnolence    . NICM (nonischemic cardiomyopathy) ([place])     01/2016 Echo: EF 20-25%, b. 01/2016 Cath: LM nl, LAD min irregs, D1 50ost, LCX min irregs, RCA min irregs, RPDA/RPL1 nl;  c. 05/2016 Echo: EF 35-40%;  d. 06/2016 s/p SJM single lead AICD (ser # [id number])..  . Non-ischemic cardiomyopathy ([place]) 06/03/2016  . Non-obstructive CAD    a. 01/2016 Cath: LM nl, LAD min irregs,  D1 50ost, LCX min irregs, RCA min irregs, RPDA/RPL1 nl.  . Physical deconditioning 02/28/2018  . Polyneuropathy in diabetes(357.2)   . Psychosexual dysfunction with inhibited sexual excitement   . Pure hypercholesterolemia   . Restless legs syndrome (RLS)   . 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 Shortness of breath 01/18/2016  . Sleep apnea    "probably" (06/21/2016)  . Sleep arousal disorder   . Swelling of lower extremity 01/18/2016  . Syncope    a. Presumed to be 2/2 VT  in setting of NICM-->s/p single lead SJM AICD in 06/2016.  [name] Syncope and collapse 06/03/2016  . Type II diabetes mellitus ([place])   . Unspecified venous (peripheral) insufficiency   . Varicose veins 10/04/2013  . Venous stasis dermatitis of both lower extremities 02/17/2018    Past Surgical History:  Procedure Laterality Date  . CARDIAC CATHETERIZATION N/A 02/12/2016   Procedure: Right/Left Heart Cath and Coronary Angiography;  Surgeon: Pixie Casino, MD;  Location: [facility];  Service: Cardiovascular;  Laterality: N/A;  . CATARACT EXTRACTION W/ INTRAOCULAR LENS  IMPLANT, BILATERAL Bilateral 2009  . EP IMPLANTABLE DEVICE N/A 06/21/2016   Procedure: ICD Implant;  Surgeon: Evans Lance, MD;  Location: [facility];  Service: Cardiovascular;  Laterality: N/A;  . FIXATION KYPHOPLASTY LUMBAR SPINE  2008  . FRACTURE SURGERY    . INTRAMEDULLARY (IM) NAIL INTERTROCHANTERIC Left 09/21/2014   Procedure: left hip intertroch fracture;  Surgeon: [name], MD;  Location: [facility];  Service: Orthopedics;  Laterality: Left;  . LOWER EXTREMITY VENOUS DOPPLER  09/04/2012   Mild valvular insufficiency in the R Common Femoral vein w/ 1.6 sec of duration of refliux  . TRANSTHORACIC ECHOCARDIOGRAM  09/04/2012   EF >55%, normal     reports that he has never smoked. He has never used smokeless tobacco. He reports that he does not drink alcohol or use drugs.  Allergies  Allergen Reactions  . Doxycycline Other (See Comments)    RECTAL BLEEDING  . Lipitor [Atorvastatin] Swelling    Ankles swell  . Lyrica [Pregabalin] Swelling    Ankles swell    Family History  Problem Relation Age of Onset  . Heart disease Mother   . Heart disease Father   . Heart disease Maternal Grandmother   . Heart disease Maternal Grandfather   . Stroke Paternal Grandmother   . Cancer Paternal Grandfather        Skin cancer  . Diabetes Brother     Prior to Admission medications   Medication Sig Start Date End Date  Taking? Authorizing Provider  acetaminophen (TYLENOL) 500 MG tablet Take 500-1,000 mg by mouth every 8 (eight) hours as needed for mild pain.   Yes [provider]  aspirin EC 81 MG tablet Take 81 mg by mouth daily.   Yes [provider]  carvedilol (COREG) 3.125 MG tablet Take 1 tablet (3.125 mg total) by mouth 2 (two) times daily with a meal. 05/04/18  Yes Hilty, Nadean Corwin, MD  docusate sodium (COLACE) 100 MG capsule Take 1 capsule (100 mg total) by mouth every 12 (twelve) hours. 12/12/18  Yes Jola Schmidt, MD  FLUoxetine (PROZAC) 20 MG tablet Take 20 mg by mouth daily.   Yes [provider]  furosemide (LASIX) 80 MG tablet Take 80 mg by mouth daily.  09/07/18  Yes [provider]  HYDROcodone-acetaminophen (NORCO/VICODIN) 5-325 MG tablet Take 1 tablet by mouth every 4 (four) hours  as needed for moderate pain. 12/12/18  Yes Jola Schmidt, MD  Multiple Minerals-Vitamins (CAL MAG ZINC +D3) TABS Take 1 tablet by mouth at bedtime.    Yes [provider]  potassium chloride SA (K-DUR,KLOR-CON) 20 MEQ tablet TAKE 1 TABLET BY MOUTH TWICE DAILY Patient taking differently: Take 10-20 mEq by mouth 2 (two) times daily.  06/12/18  Yes Hilty, Nadean Corwin, MD  rOPINIRole (REQUIP) 4 MG tablet Take 4 mg by mouth at bedtime.    Yes [provider]  rosuvastatin (CRESTOR) 20 MG tablet Take 1 tablet (20 mg total) by mouth daily. 02/23/18  Yes Memon, Jolaine Artist, MD  sacubitril-valsartan (ENTRESTO) 49-51 MG Take 1 tablet by mouth 2 (two) times daily. 06/29/18  Yes Hilty, Nadean Corwin, MD  HUMALOG MIX 75/25 (75-25) 100 UNIT/ML SUSP injection Inject 55-75 Units as directed 3 (three) times daily with meals. Inject 75 units into the skin before breakfast, 55 units before lunch, and 75 units before supper 05/03/15   [provider]    Physical Exam: Vitals:   12/26/18 0200 12/26/18 0301 12/26/18 0400 12/26/18 0600  BP: 107/90 (!) 107/58 (!) 121/92 121/73  Pulse: (!) 125  (!) 113 (!) 30 (!) 117  Resp: (!) 23 15 19 18   Temp:      TempSrc:      SpO2: 93% 91% 92% 93%  Weight:      Height:        Physical Exam  Constitutional: He is oriented to person, place, and time. He appears well-developed and well-nourished. No distress.  Morbidly obese Physical deconditioning  HENT:  Head: Normocephalic.  Mouth/Throat: Oropharynx is clear and moist.  Eyes: Right eye exhibits no discharge. Left eye exhibits no discharge.  Neck: Neck supple.  Cardiovascular: Intact distal pulses.  Tachycardic Irregularly irregular rhythm  Pulmonary/Chest: Effort normal and breath sounds normal. He has no wheezes. He has no rales.  Anterior lung fields clear to auscultation  Abdominal: Soft. Bowel sounds are normal. He exhibits no distension. There is no abdominal tenderness. There is no guarding.  Musculoskeletal:        General: Edema present.     Comments: Bilateral lower extremity edema (+3) and chronic venous stasis dermatitis.  Right lower extremity appears larger in size compared to the left lower extremity (chronic per patient and wife at bedside).  Erythema and increased warmth of bilateral lower extremities noted, worse on the right side.  Foul-smelling drainage.  Neurological: He is alert and oriented to person, place, and time.  Skin: He is not diaphoretic.  Abdomen is obese with intertrigo of pannus folds.  Psychiatric: He has a normal mood and affect. His behavior is normal.         Labs on Admission: I have personally reviewed following labs and imaging studies  CBC: Recent Labs  Lab 12/25/18 1511 12/25/18 1927  WBC 6.6 8.1  NEUTROABS  --  6.4  HGB 10.7* 11.2*  HCT 34.6* 36.0*  MCV 82.4 82.6  PLT 213 093   Basic Metabolic Panel: Recent Labs  Lab 12/25/18 1511 12/25/18 1927  NA 128* 128*  K 4.8 4.7  CL 96* 96*  CO2 22 23  GLUCOSE 171* 167*  BUN 32* 30*  CREATININE 0.88 0.80  CALCIUM 8.3* 8.4*   GFR: Estimated Creatinine Clearance: 104.7  mL/min (by C-G formula based on SCr of 0.8 mg/dL). Liver Function Tests: Recent Labs  Lab 12/25/18 1927  AST 17  ALT 15  ALKPHOS 116  BILITOT 0.9  PROT 6.7  ALBUMIN 3.3*   No results for input(s): LIPASE, AMYLASE in the last 168 hours. No results for input(s): AMMONIA in the last 168 hours. Coagulation Profile: No results for input(s): INR, PROTIME in the last 168 hours. Cardiac Enzymes: No results for input(s): CKTOTAL, CKMB, CKMBINDEX, TROPONINI in the last 168 hours. BNP (last 3 results) No results for input(s): PROBNP in the last 8760 hours. HbA1C: No results for input(s): HGBA1C in the last 72 hours. CBG: No results for input(s): GLUCAP in the last 168 hours. Lipid Profile: No results for input(s): CHOL, HDL, LDLCALC, TRIG, CHOLHDL, LDLDIRECT in the last 72 hours. Thyroid Function Tests: No results for input(s): TSH, T4TOTAL, FREET4, T3FREE, THYROIDAB in the last 72 hours. Anemia Panel: No results for input(s): VITAMINB12, FOLATE, FERRITIN, TIBC, IRON, RETICCTPCT in the last 72 hours. Urine analysis:    Component Value Date/Time   COLORURINE YELLOW 12/25/2018 1927   APPEARANCEUR CLEAR 12/25/2018 1927   LABSPEC 1.021 12/25/2018 1927   PHURINE 5.0 12/25/2018 1927   GLUCOSEU NEGATIVE 12/25/2018 1927   HGBUR NEGATIVE 12/25/2018 [place] NEGATIVE 12/25/2018 [place] NEGATIVE 12/25/2018 [place] NEGATIVE 12/25/2018 1927   UROBILINOGEN 0.2 08/01/2010 2020   NITRITE NEGATIVE 12/25/2018 1927   LEUKOCYTESUR NEGATIVE 12/25/2018 1927    Radiological Exams on Admission: Dg Chest 2 View  Result Date: 12/25/2018 CLINICAL DATA:  Shortness of breath and fluid retention. EXAM: CHEST - 2 VIEW COMPARISON:  Chest x-ray dated June 27, 2018. FINDINGS: Unchanged left chest wall pacemaker. Stable cardiomegaly. Normal pulmonary vascularity. Low lung volumes. No focal consolidation, pleural effusion, or pneumothorax. No acute osseous abnormality. IMPRESSION: 1.  Stable cardiomegaly.  No active cardiopulmonary disease. Electronically Signed   By: Titus Dubin M.D.   On: 12/25/2018 16:05   Ct Angio Chest Pe W/cm &/or Wo Cm  Result Date: 12/25/2018 CLINICAL DATA:  Bilateral leg swelling with shortness of breath EXAM: CT ANGIOGRAPHY CHEST WITH CONTRAST TECHNIQUE: Multidetector CT imaging of the chest was performed using the standard protocol during bolus administration of intravenous contrast. Multiplanar CT image reconstructions and MIPs were obtained to evaluate the vascular anatomy. CONTRAST:  94 mL ISOVUE-370 IOPAMIDOL (ISOVUE-370) INJECTION 76% COMPARISON:  Chest radiograph 12/25/2018, CT chest 08/01/2010 FINDINGS: Cardiovascular: Motion degradation limits evaluation for acute distal PE. No acute filling defects within the central or proximal segmental vessels to suggest acute embolus. Mild cardiomegaly. No significant pericardial effusion. Nonaneurysmal aorta. Coronary vascular calcification. Mediastinum/Nodes: Midline trachea. No thyroid mass. No significant adenopathy. Small hiatal hernia Lungs/Pleura: Lungs are clear. No pleural effusion or pneumothorax. Upper Abdomen: No acute abnormality. Musculoskeletal: Degenerative changes. No acute or suspicious abnormality Review of the MIP images confirms the above findings. IMPRESSION: 1. Limited evaluation for distal emboli due to motion degradation. No acute central PE is seen. 2. Mild cardiomegaly 3. Clear lung fields Electronically Signed   By: Donavan Foil M.D.   On: 12/25/2018 22:53    EKG: Independently reviewed.  A. fib (heart rate 79), low voltage.   Assessment/Plan Principal Problem:   Atrial fibrillation with rapid ventricular response (HCC) Active Problems:   RLS (restless legs syndrome)   Type 2 diabetes mellitus (HCC)   DOE (dyspnea on exertion)   Cellulitis   Hyponatremia   Chronic anemia   Humerus fracture   Chronic pain   HLD (hyperlipidemia)   A. fib with RVR Currently in A. Fib  with RVR.  Prior EKG from December 24 also showing A. fib.  Patient does not have a documented history of A. fib and EKG from June 2019 showing sinus rhythm.  CHA2DS2VASc at least 4.  Heart rate currently ranging from the 110s to 150s on the monitor.  Patient is not hypotensive. -Cardizem drip for rate control -Heparin drip for anticoagulation -Echocardiogram -Check TSH level -Consult cardiology in the morning.  Dyspnea on exertion -Shortness of breath likely secondary to new onset A. fib and physical deconditioning from morbid obesity.   -Found to be in A. fib with RVR. Heart rate ranging from 110s to 150s on the monitor.  Not hypotensive.  Cardizem drip has been started. -Patient is afebrile.  No leukocytosis.  Lactic acid normal.  Imaging without evidence of pneumonia. -CTA negative for PE.  He is not hypoxic. -Prior echo done in June 2019 showing EF 25%, diffuse hypokinesis, and grade 2 diastolic dysfunction.  Patient has an AICD in place.  BNP 1266, above baseline.  CTA negative for PE; showing mild cardiomegaly and clear lung fields.  BNP elevation possibly due to A. fib.  Will hold off giving IV diuresis at this time as Cardizem drip has been started for rate control in the setting of A. fib with RVR. Consider diuresing in the morning if blood pressure is able to tolerate. -I-STAT troponin negative.  EKG not suggestive of ACS.  Patient is not complaining of chest pain.  Acute right lower extremity cellulitis on chronic severe venous stasis -Patient is afebrile.  No leukocytosis.  Lactic acid normal.  He was started on sepsis protocol in the ED.  Tachycardia is explained by A. fib with RVR.  He is not hypotensive. Initial blood pressure recorded as low (?accuracy). Subsequent blood pressure readings normal without patient receiving any IV fluid.  -Continue vancomycin and ceftriaxone -Blood culture x2 pending -Wound care consult  Physical deconditioning secondary to morbid obesity and  multiple medical comorbidities -PT evaluation  Hyponatremia Corrected sodium 129.  He is chronically mildly hyponatremic. -Check serum osmolarity -Repeat BMP this morning  Chronic anemia -Hemoglobin 11.2, at baseline.  Type 2 diabetes Blood glucose 171.  He is not on basal insulin and was previously taking high-dose short acting insulin with meals. -Check A1c -Sliding scale insulin resistant -CBG checks  Proximal right humerus fracture Seen in the ED on December 12, 2018.  Per patient, he was seen by orthopedics and told he was not a surgical candidate due to his cardiac history. -Continue sling -Outpatient orthopedic follow-up  RLS Patient is requesting his Requip at this time as he missed his nighttime dose. -Continue Requip  Chronic pain -Continue home Norco 5-325 mg 1 tablet every 4 hours as needed  Hyperlipidemia -Continue home Crestor  DVT prophylaxis: Heparin Code Status: Full code.  Discussed with the patient. Family Communication: Wife at bedside. Disposition Plan: Anticipate discharge after clinical improvement. Consults called: None Admission status: Observation, progressive care unit   Shela Leff MD [facility] Pager [phone]  If 7PM-7AM, please contact night-coverage [email or web address] Password [email or web address]  12/26/2018, 7:01 AM

## 2018-12-27 DIAGNOSIS — I110 Hypertensive heart disease with heart failure: Secondary | ICD-10-CM | POA: Diagnosis present

## 2018-12-27 DIAGNOSIS — I472 Ventricular tachycardia: Secondary | ICD-10-CM | POA: Diagnosis present

## 2018-12-27 DIAGNOSIS — Z833 Family history of diabetes mellitus: Secondary | ICD-10-CM | POA: Diagnosis not present

## 2018-12-27 DIAGNOSIS — E785 Hyperlipidemia, unspecified: Secondary | ICD-10-CM | POA: Diagnosis present

## 2018-12-27 DIAGNOSIS — Z79899 Other long term (current) drug therapy: Secondary | ICD-10-CM | POA: Diagnosis not present

## 2018-12-27 DIAGNOSIS — L03115 Cellulitis of right lower limb: Secondary | ICD-10-CM | POA: Diagnosis present

## 2018-12-27 DIAGNOSIS — E1142 Type 2 diabetes mellitus with diabetic polyneuropathy: Secondary | ICD-10-CM | POA: Diagnosis present

## 2018-12-27 DIAGNOSIS — I4819 Other persistent atrial fibrillation: Secondary | ICD-10-CM | POA: Diagnosis present

## 2018-12-27 DIAGNOSIS — G8929 Other chronic pain: Secondary | ICD-10-CM | POA: Diagnosis present

## 2018-12-27 DIAGNOSIS — G4733 Obstructive sleep apnea (adult) (pediatric): Secondary | ICD-10-CM | POA: Diagnosis present

## 2018-12-27 DIAGNOSIS — D638 Anemia in other chronic diseases classified elsewhere: Secondary | ICD-10-CM | POA: Diagnosis present

## 2018-12-27 DIAGNOSIS — I2510 Atherosclerotic heart disease of native coronary artery without angina pectoris: Secondary | ICD-10-CM | POA: Diagnosis present

## 2018-12-27 DIAGNOSIS — Z9114 Patient's other noncompliance with medication regimen: Secondary | ICD-10-CM | POA: Diagnosis not present

## 2018-12-27 DIAGNOSIS — R0609 Other forms of dyspnea: Secondary | ICD-10-CM | POA: Diagnosis not present

## 2018-12-27 DIAGNOSIS — I5023 Acute on chronic systolic (congestive) heart failure: Secondary | ICD-10-CM

## 2018-12-27 DIAGNOSIS — I5043 Acute on chronic combined systolic (congestive) and diastolic (congestive) heart failure: Secondary | ICD-10-CM | POA: Diagnosis not present

## 2018-12-27 DIAGNOSIS — Z794 Long term (current) use of insulin: Secondary | ICD-10-CM | POA: Diagnosis not present

## 2018-12-27 DIAGNOSIS — D649 Anemia, unspecified: Secondary | ICD-10-CM | POA: Diagnosis not present

## 2018-12-27 DIAGNOSIS — E7800 Pure hypercholesterolemia, unspecified: Secondary | ICD-10-CM | POA: Diagnosis present

## 2018-12-27 DIAGNOSIS — I4891 Unspecified atrial fibrillation: Secondary | ICD-10-CM | POA: Diagnosis not present

## 2018-12-27 DIAGNOSIS — Z7982 Long term (current) use of aspirin: Secondary | ICD-10-CM | POA: Diagnosis not present

## 2018-12-27 DIAGNOSIS — J9600 Acute respiratory failure, unspecified whether with hypoxia or hypercapnia: Secondary | ICD-10-CM | POA: Diagnosis present

## 2018-12-27 DIAGNOSIS — I428 Other cardiomyopathies: Secondary | ICD-10-CM | POA: Diagnosis present

## 2018-12-27 DIAGNOSIS — G2581 Restless legs syndrome: Secondary | ICD-10-CM | POA: Diagnosis present

## 2018-12-27 DIAGNOSIS — Z96642 Presence of left artificial hip joint: Secondary | ICD-10-CM | POA: Diagnosis present

## 2018-12-27 DIAGNOSIS — Z95810 Presence of automatic (implantable) cardiac defibrillator: Secondary | ICD-10-CM | POA: Diagnosis not present

## 2018-12-27 DIAGNOSIS — E871 Hypo-osmolality and hyponatremia: Secondary | ICD-10-CM | POA: Diagnosis present

## 2018-12-27 DIAGNOSIS — Z6841 Body Mass Index (BMI) 40.0 and over, adult: Secondary | ICD-10-CM | POA: Diagnosis not present

## 2018-12-27 DIAGNOSIS — R0602 Shortness of breath: Secondary | ICD-10-CM | POA: Diagnosis present

## 2018-12-27 LAB — CBC
HCT: 35.2 % — ABNORMAL LOW (ref 39.0–52.0)
Hemoglobin: 11.3 g/dL — ABNORMAL LOW (ref 13.0–17.0)
MCH: 26.5 pg (ref 26.0–34.0)
MCHC: 32.1 g/dL (ref 30.0–36.0)
MCV: 82.6 fL (ref 80.0–100.0)
NRBC: 0 % (ref 0.0–0.2)
Platelets: 223 10*3/uL (ref 150–400)
RBC: 4.26 MIL/uL (ref 4.22–5.81)
RDW: 15.3 % (ref 11.5–15.5)
WBC: 7.9 10*3/uL (ref 4.0–10.5)

## 2018-12-27 LAB — HEPARIN LEVEL (UNFRACTIONATED): Heparin Unfractionated: 0.37 IU/mL (ref 0.30–0.70)

## 2018-12-27 LAB — GLUCOSE, CAPILLARY
Glucose-Capillary: 169 mg/dL — ABNORMAL HIGH (ref 70–99)
Glucose-Capillary: 139 mg/dL — ABNORMAL HIGH (ref 70–99)
Glucose-Capillary: 144 mg/dL — ABNORMAL HIGH (ref 70–99)
Glucose-Capillary: 120 mg/dL — ABNORMAL HIGH (ref 70–99)

## 2018-12-27 MED ORDER — DILTIAZEM HCL 60 MG PO TABS: 60.0000 mg | ORAL_TABLET | Freq: Three times a day (TID) | ORAL | Status: DC

## 2018-12-27 MED ORDER — FUROSEMIDE 10 MG/ML IJ SOLN
80.0000 mg | Freq: Two times a day (BID) | INTRAMUSCULAR | Status: DC
  Administered 2018-12-27 – 2018-12-31 (×8): 80 mg via INTRAVENOUS
  Filled 2018-12-27 (×8): qty 8

## 2018-12-27 MED ORDER — FUROSEMIDE 10 MG/ML IJ SOLN: 40.0000 mg | Freq: Three times a day (TID) | INTRAMUSCULAR | Status: DC

## 2018-12-27 MED ORDER — APIXABAN 5 MG PO TABS
5.0000 mg | ORAL_TABLET | Freq: Two times a day (BID) | ORAL | Status: DC
  Administered 2018-12-27 – 2019-01-03 (×15): 5 mg via ORAL
  Filled 2018-12-27 (×15): qty 1

## 2018-12-27 MED ORDER — SACUBITRIL-VALSARTAN 49-51 MG PO TABS
1.0000 | ORAL_TABLET | Freq: Two times a day (BID) | ORAL | Status: DC
  Administered 2018-12-27 – 2019-01-03 (×15): 1 via ORAL
  Filled 2018-12-27 (×17): qty 1

## 2018-12-27 MED ORDER — FUROSEMIDE 10 MG/ML IJ SOLN
80.0000 mg | Freq: Once | INTRAMUSCULAR | Status: AC
  Administered 2018-12-27: 80 mg via INTRAVENOUS
  Filled 2018-12-27: qty 8

## 2018-12-27 MED ORDER — CARVEDILOL 6.25 MG PO TABS
6.2500 mg | ORAL_TABLET | Freq: Two times a day (BID) | ORAL | Status: DC
  Administered 2018-12-27 – 2019-01-03 (×14): 6.25 mg via ORAL
  Filled 2018-12-27 (×15): qty 1

## 2018-12-27 NOTE — NC FL2 (Signed)
[place] MEDICAID FL2 LEVEL OF CARE SCREENING TOOL     IDENTIFICATION  Patient Name: Jeffery Nelson Birthdate: [birth year] Sex: male Admission Date (Current Location): 12/25/2018  [facility] Care Communities and [place] Number:  Herbalist and Address:  [facility]. [facility], [place] [address] [place], [place] [address]      Provider Number: [id number]  Attending Physician Name and Address:  Damita Lack, MD  Relative Name and Phone Number:  Jadene Pierini, sister, [phone]    Current Level of Care: Hospital Recommended Level of Care: [place] Prior Approval Number:    Date Approved/Denied:   PASRR Number: [id number]  Discharge Plan: SNF    Current Diagnoses: Patient Active Problem List   Diagnosis Date Noted  . Atrial fibrillation with rapid ventricular response ([place]) 12/26/2018  . DOE (dyspnea on exertion) 12/26/2018  . Cellulitis 12/26/2018  . Hyponatremia 12/26/2018  . Chronic anemia 12/26/2018  . Humerus fracture 12/26/2018  . Chronic pain 12/26/2018  . HLD (hyperlipidemia) 12/26/2018  . Physical deconditioning 02/28/2018  . Venous stasis dermatitis of both lower extremities 02/17/2018  . CHF (congestive heart failure) ([place]) 02/17/2018  . Cellulitis of right leg 02/17/2018  . Hx of medication noncompliance 08/23/2017  . Encounter for long-term (current) use of medications 04/01/2017  . Syncope   . Type 2 diabetes mellitus ([place])   . Non-obstructive CAD   . NICM (nonischemic cardiomyopathy) ([place])   . Hypertensive heart disease with heart failure ([place])   . Chronic combined systolic and diastolic CHF, NYHA class 2 ([place])   . AICD (automatic cardioverter/defibrillator) present   . Acute on chronic systolic congestive heart failure ([place]) 06/21/2016  . Syncope and collapse 06/03/2016  . Non-ischemic cardiomyopathy ([place]) 06/03/2016  . Candida rash of groin 02/12/2016  . Cardiomyopathy ([place]) 02/04/2016  . Swelling of lower extremity  01/18/2016  . Shortness of breath 01/18/2016  . Edema 10/08/2014  . Intertrochanteric fx-closed ([place]) 10/04/2014  . S/P total hip arthroplasty 10/04/2014  . Diabetes mellitus type 2, controlled, with complications ([place])   . Pure hypercholesterolemia   . Essential hypertension   . Polyneuropathy in diabetes(357.2)   . RLS (restless legs syndrome)   . Hip fracture ([place]) 09/21/2014  . Morbid obesity ([place]) 10/04/2013  . History of shingles 10/04/2013  . Varicose veins 10/04/2013  . Needs sleep apnea assessment 10/04/2013    Orientation RESPIRATION BLADDER Height & Weight     Self, Time, Situation, Place  O2(nasal cannula 2L) Continent Weight: (!) 140.6 kg Height:  5\' 5"  (165.1 cm)  BEHAVIORAL SYMPTOMS/MOOD NEUROLOGICAL BOWEL NUTRITION STATUS      Continent Diet(please see DC summary)  AMBULATORY STATUS COMMUNICATION OF NEEDS Skin   Limited Assist Verbally Normal                       Personal Care Assistance Level of Assistance  Bathing, Feeding, Dressing Bathing Assistance: Limited assistance Feeding assistance: Limited assistance Dressing Assistance: Limited assistance     Functional Limitations Info  Sight, Hearing, Speech Sight Info: Adequate Hearing Info: Adequate Speech Info: Adequate    SPECIAL CARE FACTORS FREQUENCY  PT (By licensed PT), OT (By licensed OT)     PT Frequency: 5x/week OT Frequency: 5x/week            Contractures Contractures Info: Not present    Additional Factors Info  Code Status, Allergies, Insulin Sliding Scale Code Status Info: Full Allergies Info: Doxycycline, Lipitor Atorvastatin, Lyrica Pregabalin  Insulin Sliding Scale Info: novolog 3x/day with meals       Current Medications (12/27/2018):  This is the current hospital active medication list Current Facility-Administered Medications  Medication Dose Route Frequency Provider Last Rate Last Dose  . acetaminophen (TYLENOL) tablet 500-1,000 mg  500-1,000 mg Oral Q8H PRN  Shela Leff, MD   500 mg at 12/27/18 1020  . aspirin EC tablet 81 mg  81 mg Oral Daily Shela Leff, MD   81 mg at 12/27/18 0857  . calcium-vitamin D (OSCAL WITH D) 500-200 MG-UNIT per tablet 1 tablet  1 tablet Oral QHS Shela Leff, MD   1 tablet at 12/26/18 2131  . cefTRIAXone (ROCEPHIN) 2 g in sodium chloride 0.9 % 100 mL IVPB  2 g Intravenous Q24H Shela Leff, MD 200 mL/hr at 12/26/18 2130    . diltiazem (CARDIZEM) 100 mg in dextrose 5% 164mL (1 mg/mL) infusion  5-15 mg/hr Intravenous Titrated Shela Leff, MD   Stopped at 12/26/18 0809  . docusate sodium (COLACE) capsule 100 mg  100 mg Oral BID Shela Leff, MD   100 mg at 12/26/18 2130  . FLUoxetine (PROZAC) capsule 20 mg  20 mg Oral Daily Shela Leff, MD   20 mg at 12/27/18 0857  . heparin ADULT infusion 100 units/mL (25000 units/271mL sodium chloride 0.45%)  1,500 Units/hr Intravenous Continuous Kris Mouton, RPH 15 mL/hr at 12/27/18 0500 1,500 Units/hr at 12/27/18 0500  . hydrocerin (EUCERIN) cream   Topical BID Shela Leff, MD      . HYDROcodone-acetaminophen (NORCO/VICODIN) 5-325 MG per tablet 1 tablet  1 tablet Oral Q4H PRN Shela Leff, MD   1 tablet at 12/27/18 0907  . insulin aspart (novoLOG) injection 0-20 Units  0-20 Units Subcutaneous TID WC Shela Leff, MD   4 Units at 12/27/18 0858  . methocarbamol (ROBAXIN) tablet 500 mg  500 mg Oral Q6H PRN [name], MD   500 mg at 12/27/18 1020  . rOPINIRole (REQUIP) tablet 4 mg  4 mg Oral QHS Shela Leff, MD   4 mg at 12/26/18 2130  . rosuvastatin (CRESTOR) tablet 20 mg  20 mg Oral q1800 Shela Leff, MD   20 mg at 12/26/18 1729  . vancomycin (VANCOCIN) 1,250 mg in sodium chloride 0.9 % 250 mL IVPB  1,250 mg Intravenous Q12H Shela Leff, MD   Stopped at 12/26/18 2351     Discharge Medications: Please see discharge summary for a list of discharge medications.  Relevant Imaging Results:  Relevant  Lab Results:   Additional Information SSN: [id number]  Estanislado Emms, LCSW

## 2018-12-27 NOTE — Progress Notes (Signed)
[facility] for Heparin Indication: atrial fibrillation  Allergies  Allergen Reactions  . Doxycycline Other (See Comments)    RECTAL BLEEDING  . Lipitor [Atorvastatin] Swelling    Ankles swell  . Lyrica [Pregabalin] Swelling    Ankles swell    Patient Measurements: Height: 5\' 5"  (165.1 cm) Weight: (!) 309 lb 15.5 oz (140.6 kg) IBW/kg (Calculated) : 61.5 Heparin Dosing Weight: 90 kg  Vital Signs: Temp: 97.6 F (36.4 C) (01/08 0335) Temp Source: Oral (01/08 0335) BP: 121/80 (01/08 0335) Pulse Rate: 99 (01/08 0335)  Labs: Recent Labs    12/25/18 1511 12/25/18 1927 12/26/18 0757 12/26/18 1311 12/27/18 0525  HGB 10.7* 11.2*  --   --  11.3*  HCT 34.6* 36.0*  --   --  35.2*  PLT 213 239  --   --  223  HEPARINUNFRC  --   --   --  0.28* 0.37  CREATININE 0.88 0.80 0.91  --   --     Estimated Creatinine Clearance: 95.2 mL/min (by C-G formula based on SCr of 0.91 mg/dL).   Medical History: Past Medical History:  Diagnosis Date  . Acute on chronic systolic congestive heart failure ([place]) 06/21/2016  . AICD (automatic cardioverter/defibrillator) present    a. 06/21/2016 [facility] single lead AICD (ser # [id number]).  . Benign neoplasm of colon   . Candida rash of groin 02/12/2016  . Cardiomyopathy ([place]) 02/04/2016  . Cellulitis of right leg 02/17/2018  . Chest pain    From rib fractures  . CHF (congestive heart failure) ([place]) 02/17/2018  . Chronic combined systolic and diastolic CHF (congestive heart failure) ([place])    a. 01/2016 Echo: EF 20-25%, diff HK; b. 05/2016 Echo: EF 35-40%, diff HK, gr1 DD, diff HK, mild MR, mod dil LA/RA, mod TR, PASP 79mmHg.  [name] Chronic combined systolic and diastolic CHF, NYHA class 2 ([place])    a. 01/2016 Echo: EF 20-25%, diff HK; b. 05/2016 Echo: EF 35-40%, diff HK, gr1 DD, diff HK, mild MR, mod dil LA/RA, mod TR, PASP 75mmHg.  . Diabetes mellitus type 2, controlled, with complications ([place])   . Edema 10/08/2014  . Encounter  for long-term (current) use of medications 04/01/2017  . Essential hypertension   . Gout   . Hip fracture ([place]) 09/21/2014  . History of shingles 10/04/2013  . Hx of medication noncompliance 08/23/2017  . Hypertensive heart disease with heart failure ([place])   . Intertrochanteric fx-closed ([place]) 10/04/2014  . Lower extremity edema   . Morbid obesity ([place]) 10/04/2013  . Needs sleep apnea assessment 10/04/2013   Snoring, obesity, daytime somnolence    . NICM (nonischemic cardiomyopathy) ([place])     01/2016 Echo: EF 20-25%, b. 01/2016 Cath: LM nl, LAD min irregs, D1 50ost, LCX min irregs, RCA min irregs, RPDA/RPL1 nl;  c. 05/2016 Echo: EF 35-40%;  d. 06/2016 s/p [facility] single lead AICD (ser # [id number])..  . Non-ischemic cardiomyopathy ([place]) 06/03/2016  . Non-obstructive CAD    a. 01/2016 Cath: LM nl, LAD min irregs, D1 50ost, LCX min irregs, RCA min irregs, RPDA/RPL1 nl.  . Physical deconditioning 02/28/2018  . Polyneuropathy in diabetes(357.2)   . Psychosexual dysfunction with inhibited sexual excitement   . Pure hypercholesterolemia   . Restless legs syndrome (RLS)   . 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 Shortness of breath 01/18/2016  . Sleep apnea    "probably" (06/21/2016)  . Sleep arousal disorder   .  Swelling of lower extremity 01/18/2016  . Syncope    a. Presumed to be 2/2 VT in setting of NICM-->s/p single lead [facility] AICD in 06/2016.  [name] Syncope and collapse 06/03/2016  . Type II diabetes mellitus ([place])   . Unspecified venous (peripheral) insufficiency   . Varicose veins 10/04/2013  . Venous stasis dermatitis of both lower extremities 02/17/2018    Medications:  No current facility-administered medications on file prior to encounter.    Current Outpatient Medications on File Prior to Encounter  Medication Sig Dispense Refill  . acetaminophen (TYLENOL) 500 MG tablet Take 500-1,000 mg by mouth every 8 (eight) hours as needed for mild pain.    [name] aspirin EC 81 MG tablet Take 81 mg by  mouth daily.    . carvedilol (COREG) 3.125 MG tablet Take 1 tablet (3.125 mg total) by mouth 2 (two) times daily with a meal. 180 tablet 2  . docusate sodium (COLACE) 100 MG capsule Take 1 capsule (100 mg total) by mouth every 12 (twelve) hours. 60 capsule 0  . FLUoxetine (PROZAC) 20 MG tablet Take 20 mg by mouth daily.    . furosemide (LASIX) 80 MG tablet Take 80 mg by mouth daily.   5  . HYDROcodone-acetaminophen (NORCO/VICODIN) 5-325 MG tablet Take 1 tablet by mouth every 4 (four) hours as needed for moderate pain. 15 tablet 0  . Multiple Minerals-Vitamins (CAL MAG ZINC +D3) TABS Take 1 tablet by mouth at bedtime.     . potassium chloride SA (K-DUR,KLOR-CON) 20 MEQ tablet TAKE 1 TABLET BY MOUTH TWICE DAILY (Patient taking differently: Take 10-20 mEq by mouth 2 (two) times daily. ) 180 tablet 3  . rOPINIRole (REQUIP) 4 MG tablet Take 4 mg by mouth at bedtime.     . rosuvastatin (CRESTOR) 20 MG tablet Take 1 tablet (20 mg total) by mouth daily. 30 tablet 0  . sacubitril-valsartan (ENTRESTO) 49-51 MG Take 1 tablet by mouth 2 (two) times daily. 60 tablet 3  . HUMALOG MIX 75/25 (75-25) 100 UNIT/ML SUSP injection Inject 55-75 Units as directed 3 (three) times daily with meals. Inject 75 units into the skin before breakfast, 55 units before lunch, and 75 units before supper  2     Assessment: 73 y.o. male with new onset Afib for heparin (CHADSVASC= 4) -initial heparin level= 0.37  Goal of Therapy:  Heparin level 0.3-0.7 units/ml Monitor platelets by anticoagulation protocol: Yes   Plan:  -Increase heparin to 1500 units/hr -Daily heparin level and CBC -Will follow oral anticoagulation plans  Hildred Laser, PharmD Clinical Pharmacist **Pharmacist phone directory can now be found on [email or web address] ([email or web address]).  Listed under [place].

## 2018-12-27 NOTE — Plan of Care (Signed)
  Problem: Education: Goal: Knowledge of General Education information will improve Description Including pain rating scale, medication(s)/side effects and non-pharmacologic comfort measures Outcome: Progressing   Problem: Health Behavior/Discharge Planning: Goal: Ability to manage health-related needs will improve Outcome: Progressing   

## 2018-12-27 NOTE — Consult Note (Addendum)
Cardiology Consultation:   Patient ID: KHYRON GARNO MRN: [id number]; DOB: 1946-07-10  Admit date: 12/25/2018 Date of Consult: 12/27/2018  Primary Care Provider: [name], MD Primary Cardiologist: Pixie Casino, MD  Primary Electrophysiologist:  Cristopher Peru, MD   Patient Profile:   Mr. Trageser is a 73 y/o super morbidly obese  male with a h/o NICM, chronic combined systolic and diastolic HF, nonobstructive CAD by cath in 2017, h/o syncope presumed to be VT s/p AICD (St. Jude) followed by Dr. Lovena Le, HTN, HLD, T2DM and presumed OSA, who is being seen today for atrial fibrillation, at the request of Dr. Reesa Chew, Internal Medicine.   History of Present Illness:   Mr. Beville is a 73 y/o super morbidly obese  male with a h/o NICM, chronic combined systolic and diastolic HF, nonobstructive CAD by cath in 2017, h/o syncope presumed to be VT s/p AICD (St. Jude) followed by Dr. Lovena Le, HTN, HLD, T2DM and presumed OSA, who is being seen today for atrial fibrillation, at the request of Dr. Reesa Chew, Internal Medicine.   He had a LHC in 2017 for low EF. Cath showed no significant obstructive CAD. There was a 50% stenosis in the 1st diag but no other lesions. His ICD was implanted 06/21/2016. Per clinic notes, no ICD shocks to date. Most recent echo, prior to this admit, was in June 2019 and showed EF at 25% with diffuse hypokinesis and G2DD. No significant valvular abnormalties. Per chart review, there is no prior h/o atrial fibrillation. Also ff note, he recently suffered a right proximal humerus fracture on Christmas Eve. He fell out of bed and landed on his arm (rolled over too far off the edge). Plan is conservative management and physical therapy, per pt. He is currently in an arm sling.   Pt [facility] to [facility] ED on 12/25/18, as advised by Dr. Debara Pickett, via phone, given reports of 20 lb weight gain and increased dyspnea. In the ED, he was found to be in acute on chronic combined systolic and diastolic CHF  as well as new onset atrial fibrillation w/ RVR up to the 150s. BNP was 1,266. Scr normal at 0.88. He also had significant bilateral LEE w/ cellulitis and was started on IV antibiotics and IV Cardizem. Lasix initially held due to soft BP but he is now on IV lasix, 40 mg Q8H. Of note, chest CT was also checked and negative for PE. Cardiology asked to assist w/ afib management.    Pt remains in atrial fibrillation but rates are improved, in the 80s-low 100s. IV Cardizem has been discontinued. He is on Coreg. No palpitations. Getting IV Lasix. Remains significantly volume overloaded and requiring supplemental O2 via [place]. No CP. He admits to poor compliance with Lasix at home. Often skips doses to avoid frequent urination and also not restricting sodium at home.    Past Medical History:  Diagnosis Date  . Acute on chronic systolic congestive heart failure ([place]) 06/21/2016  . AICD (automatic cardioverter/defibrillator) present    a. 06/21/2016 SJM single lead AICD (ser # [id number]).  . Benign neoplasm of colon   . Candida rash of groin 02/12/2016  . Cardiomyopathy ([place]) 02/04/2016  . Cellulitis of right leg 02/17/2018  . Chest pain    From rib fractures  . CHF (congestive heart failure) ([place]) 02/17/2018  . Chronic combined systolic and diastolic CHF (congestive heart failure) ([place])    a. 01/2016 Echo: EF 20-25%, diff HK; b. 05/2016 Echo: EF 35-40%, diff HK,  gr1 DD, diff HK, mild MR, mod dil LA/RA, mod TR, PASP 9mmHg.  [name] Chronic combined systolic and diastolic CHF, NYHA class 2 ([place])    a. 01/2016 Echo: EF 20-25%, diff HK; b. 05/2016 Echo: EF 35-40%, diff HK, gr1 DD, diff HK, mild MR, mod dil LA/RA, mod TR, PASP 68mmHg.  . Diabetes mellitus type 2, controlled, with complications ([place])   . Edema 10/08/2014  . Encounter for long-term (current) use of medications 04/01/2017  . Essential hypertension   . Gout   . Hip fracture ([place]) 09/21/2014  . History of shingles 10/04/2013  . Hx of medication noncompliance  08/23/2017  . Hypertensive heart disease with heart failure ([place])   . Intertrochanteric fx-closed ([place]) 10/04/2014  . Lower extremity edema   . Morbid obesity ([place]) 10/04/2013  . Needs sleep apnea assessment 10/04/2013   Snoring, obesity, daytime somnolence    . NICM (nonischemic cardiomyopathy) ([place])     01/2016 Echo: EF 20-25%, b. 01/2016 Cath: LM nl, LAD min irregs, D1 50ost, LCX min irregs, RCA min irregs, RPDA/RPL1 nl;  c. 05/2016 Echo: EF 35-40%;  d. 06/2016 s/p SJM single lead AICD (ser # [id number])..  . Non-ischemic cardiomyopathy ([place]) 06/03/2016  . Non-obstructive CAD    a. 01/2016 Cath: LM nl, LAD min irregs, D1 50ost, LCX min irregs, RCA min irregs, RPDA/RPL1 nl.  . Physical deconditioning 02/28/2018  . Polyneuropathy in diabetes(357.2)   . Psychosexual dysfunction with inhibited sexual excitement   . Pure hypercholesterolemia   . Restless legs syndrome (RLS)   . 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 Shortness of breath 01/18/2016  . Sleep apnea    "probably" (06/21/2016)  . Sleep arousal disorder   . Swelling of lower extremity 01/18/2016  . Syncope    a. Presumed to be 2/2 VT in setting of NICM-->s/p single lead SJM AICD in 06/2016.  [name] Syncope and collapse 06/03/2016  . Type II diabetes mellitus ([place])   . Unspecified venous (peripheral) insufficiency   . Varicose veins 10/04/2013  . Venous stasis dermatitis of both lower extremities 02/17/2018    Past Surgical History:  Procedure Laterality Date  . CARDIAC CATHETERIZATION N/A 02/12/2016   Procedure: Right/Left Heart Cath and Coronary Angiography;  Surgeon: Pixie Casino, MD;  Location: [place] CV LAB;  Service: Cardiovascular;  Laterality: N/A;  . CATARACT EXTRACTION W/ INTRAOCULAR LENS  IMPLANT, BILATERAL Bilateral 2009  . EP IMPLANTABLE DEVICE N/A 06/21/2016   Procedure: ICD Implant;  Surgeon: Evans Lance, MD;  Location: [place] CV LAB;  Service: Cardiovascular;  Laterality: N/A;  . FIXATION KYPHOPLASTY  LUMBAR SPINE  2008  . FRACTURE SURGERY    . INTRAMEDULLARY (IM) NAIL INTERTROCHANTERIC Left 09/21/2014   Procedure: left hip intertroch fracture;  Surgeon: [name], MD;  Location: [place];  Service: Orthopedics;  Laterality: Left;  . LOWER EXTREMITY VENOUS DOPPLER  09/04/2012   Mild valvular insufficiency in the R Common Femoral vein w/ 1.6 sec of duration of refliux  . TRANSTHORACIC ECHOCARDIOGRAM  09/04/2012   EF >55%, normal     Home Medications:  Prior to Admission medications   Medication Sig Start Date End Date Taking? Authorizing Provider  acetaminophen (TYLENOL) 500 MG tablet Take 500-1,000 mg by mouth every 8 (eight) hours as needed for mild pain.   Yes [provider]  aspirin EC 81 MG tablet Take 81 mg by mouth daily.   Yes [provider]  carvedilol (COREG) 3.125 MG  tablet Take 1 tablet (3.125 mg total) by mouth 2 (two) times daily with a meal. 05/04/18  Yes Hilty, Nadean Corwin, MD  docusate sodium (COLACE) 100 MG capsule Take 1 capsule (100 mg total) by mouth every 12 (twelve) hours. 12/12/18  Yes Jola Schmidt, MD  FLUoxetine (PROZAC) 20 MG tablet Take 20 mg by mouth daily.   Yes [provider]  furosemide (LASIX) 80 MG tablet Take 80 mg by mouth daily.  09/07/18  Yes [provider]  HYDROcodone-acetaminophen (NORCO/VICODIN) 5-325 MG tablet Take 1 tablet by mouth every 4 (four) hours as needed for moderate pain. 12/12/18  Yes Jola Schmidt, MD  Multiple Minerals-Vitamins (CAL MAG ZINC +D3) TABS Take 1 tablet by mouth at bedtime.    Yes [provider]  potassium chloride SA (K-DUR,KLOR-CON) 20 MEQ tablet TAKE 1 TABLET BY MOUTH TWICE DAILY Patient taking differently: Take 10-20 mEq by mouth 2 (two) times daily.  06/12/18  Yes Hilty, Nadean Corwin, MD  rOPINIRole (REQUIP) 4 MG tablet Take 4 mg by mouth at bedtime.    Yes [provider]  rosuvastatin (CRESTOR) 20 MG tablet Take 1 tablet (20 mg total) by mouth daily. 02/23/18  Yes Memon,  Jolaine Artist, MD  sacubitril-valsartan (ENTRESTO) 49-51 MG Take 1 tablet by mouth 2 (two) times daily. 06/29/18  Yes Hilty, Nadean Corwin, MD  HUMALOG MIX 75/25 (75-25) 100 UNIT/ML SUSP injection Inject 55-75 Units as directed 3 (three) times daily with meals. Inject 75 units into the skin before breakfast, 55 units before lunch, and 75 units before supper 05/03/15   [provider]    Inpatient Medications: Scheduled Meds: . aspirin EC  81 mg Oral Daily  . calcium-vitamin D  1 tablet Oral QHS  . docusate sodium  100 mg Oral BID  . FLUoxetine  20 mg Oral Daily  . hydrocerin   Topical BID  . insulin aspart  0-20 Units Subcutaneous TID WC  . rOPINIRole  4 mg Oral QHS  . rosuvastatin  20 mg Oral q1800   Continuous Infusions: . cefTRIAXone (ROCEPHIN)  IV 200 mL/hr at 12/26/18 2130  . dilTIAZem HCl-Dextrose Stopped (12/26/18 0809)  . heparin 1,500 Units/hr (12/27/18 0500)  . vancomycin Stopped (12/26/18 2351)   PRN Meds: acetaminophen, HYDROcodone-acetaminophen, methocarbamol  Allergies:    Allergies  Allergen Reactions  . Doxycycline Other (See Comments)    RECTAL BLEEDING  . Lipitor [Atorvastatin] Swelling    Ankles swell  . Lyrica [Pregabalin] Swelling    Ankles swell    Social History:   Social History   Socioeconomic History  . Marital status: Widowed    Spouse name: Not on file  . Number of children: Not on file  . Years of education: Not on file  . Highest education level: Not on file  Occupational History  . Not on file  Social Needs  . Financial resource strain: Not on file  . Food insecurity:    Worry: Not on file    Inability: Not on file  . Transportation needs:    Medical: Not on file    Non-medical: Not on file  Tobacco Use  . Smoking status: Never Smoker  . Smokeless tobacco: Never Used  Substance and Sexual Activity  . Alcohol use: No  . Drug use: No  . Sexual activity: Yes  Lifestyle  . Physical activity:    Days per week: Not on file     Minutes per session: Not on file  . Stress: Not on file  Relationships  . Social connections:    Talks on phone: Not on file    Gets together: Not on file    Attends religious service: Not on file    Active member of club or organization: Not on file    Attends meetings of clubs or organizations: Not on file    Relationship status: Not on file  . Intimate partner violence:    Fear of current or ex partner: Not on file    Emotionally abused: Not on file    Physically abused: Not on file    Forced sexual activity: Not on file  Other Topics Concern  . Not on file  Social History Narrative  . Not on file    Family History:    Family History  Problem Relation Age of Onset  . Heart disease Mother   . Heart disease Father   . Heart disease Maternal Grandmother   . Heart disease Maternal Grandfather   . Stroke Paternal Grandmother   . Cancer Paternal Grandfather        Skin cancer  . Diabetes Brother      ROS:  Please see the history of present illness.   All other ROS reviewed and negative.     Physical Exam/Data:   Vitals:   12/26/18 1304 12/26/18 2018 12/27/18 0010 12/27/18 0335  BP: 93/67 111/68 106/73 121/80  Pulse:  88 79 99  Resp: (!) 29 (!) 21 20 (!) 21  Temp: 97.9 F (36.6 C) 97.8 F (36.6 C) 98.7 F (37.1 C) 97.6 F (36.4 C)  TempSrc: Oral Oral Oral Oral  SpO2: 98% 100% 99% 100%  Weight: (!) 140.6 kg   (!) 140.6 kg  Height: 5\' 5"  (1.651 m)       Intake/Output Summary (Last 24 hours) at 12/27/2018 1134 Last data filed at 12/27/2018 1117 Gross per 24 hour  Intake 1212.27 ml  Output 575 ml  Net 637.27 ml   Filed Weights   12/25/18 1505 12/26/18 1304 12/27/18 0335  Weight: 136.1 kg (!) 140.6 kg (!) 140.6 kg   Body mass index is 51.58 kg/m.  General:  [place] morbidly obese WM, multiple upper torso and upper extremity tattoos, HEENT: normal Lymph: no adenopathy Neck: difficult to assess JVD due to obesity,  Endocrine:  No thryomegaly Vascular: No  carotid bruits; FA pulses 2+ bilaterally without bruits  Cardiac:  irregularly irregular rhythm, regular rate- mildly tachy Lungs:  Distant breath sounds due to body habitus Abd: obese abdomen. Soft nontender Ext: upper right arm w/ anterior ecchymosis and in arm sling. bilateral LEs are edematous with overlying cellulitis  Musculoskeletal:  Per above Neuro:  CNs 2-12 intact, no focal abnormalities noted Psych:  Normal affect   EKG:  The EKG 12/25/18 was personally reviewed and demonstrates:  Atrial fibrillation 79 bpm,  Telemetry:  Telemetry was personally reviewed and demonstrates:  Atrial fibrillation w/ variable rates ranging from the 80s- ow 100s  Relevant CV Studies: [facility] 02/12/2016 Procedures   Right/Left Heart Cath and Coronary Angiography  Conclusion    Ost 1st Diag to 1st Diag lesion, 50% stenosed.  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 Moderate pulmonary venous hypertension, reduced cardiac index. Elevated PCWP. Will need additional diuresis and medication adjustment for non-ischemic cardiomyopathy as an outpatient.  Pixie Casino, MD, [facility] Attending Cardiologist [facility]     2D Echo 12/26/2018 Study Conclusions  - Left  ventricle: The cavity size was mildly dilated. Wall   thickness was increased in a pattern of mild LVH. Systolic   function was severely reduced. The estimated ejection fraction   was in the range of 20% to 25%. Wall motion was normal; there   were no regional wall motion abnormalities. The study was not   technically sufficient to allow evaluation of LV diastolic   dysfunction due to atrial fibrillation. - Aortic valve: Trileaflet; mildly thickened, mildly calcified   leaflets. - Mitral valve: Calcified annulus. There was mild regurgitation. - Left atrium: The atrium was severely dilated. - Right atrium: The  atrium was moderately dilated. - Tricuspid valve: There was mild regurgitation. - Pulmonary arteries: PA peak pressure: 31 mm Hg (S).  Laboratory Data:  Chemistry Recent Labs  Lab 12/25/18 1511 12/25/18 1927 12/26/18 0757  NA 128* 128* 128*  K 4.8 4.7 5.0  CL 96* 96* 96*  CO2 22 23 19*  GLUCOSE 171* 167* 200*  BUN 32* 30* 30*  CREATININE 0.88 0.80 0.91  CALCIUM 8.3* 8.4* 8.0*  GFRNONAA >60 >60 >60  GFRAA >60 >60 >60  ANIONGAP 10 9 13     Recent Labs  Lab 12/25/18 1927  PROT 6.7  ALBUMIN 3.3*  AST 17  ALT 15  ALKPHOS 116  BILITOT 0.9   Hematology Recent Labs  Lab 12/25/18 1511 12/25/18 1927 12/27/18 0525  WBC 6.6 8.1 7.9  RBC 4.20* 4.36 4.26  HGB 10.7* 11.2* 11.3*  HCT 34.6* 36.0* 35.2*  MCV 82.4 82.6 82.6  MCH 25.5* 25.7* 26.5  MCHC 30.9 31.1 32.1  RDW 15.2 15.2 15.3  PLT 213 239 223   Cardiac EnzymesNo results for input(s): TROPONINI in the last 168 hours.  Recent Labs  Lab 12/25/18 1538  TROPIPOC 0.02    BNP Recent Labs  Lab 12/25/18 1927  BNP 1,266.8*    DDimer No results for input(s): DDIMER in the last 168 hours.  Radiology/Studies:  Dg Chest 2 View  Result Date: 12/25/2018 CLINICAL DATA:  Shortness of breath and fluid retention. EXAM: CHEST - 2 VIEW COMPARISON:  Chest x-ray dated June 27, 2018. FINDINGS: Unchanged left chest wall pacemaker. Stable cardiomegaly. Normal pulmonary vascularity. Low lung volumes. No focal consolidation, pleural effusion, or pneumothorax. No acute osseous abnormality. IMPRESSION: 1. Stable cardiomegaly.  No active cardiopulmonary disease. Electronically Signed   By: Titus Dubin M.D.   On: 12/25/2018 16:05   Ct Angio Chest Pe W/cm &/or Wo Cm  Result Date: 12/25/2018 CLINICAL DATA:  Bilateral leg swelling with shortness of breath EXAM: CT ANGIOGRAPHY CHEST WITH CONTRAST TECHNIQUE: Multidetector CT imaging of the chest was performed using the standard protocol during bolus administration of intravenous contrast.  Multiplanar CT image reconstructions and MIPs were obtained to evaluate the vascular anatomy. CONTRAST:  94 mL ISOVUE-370 IOPAMIDOL (ISOVUE-370) INJECTION 76% COMPARISON:  Chest radiograph 12/25/2018, CT chest 08/01/2010 FINDINGS: Cardiovascular: Motion degradation limits evaluation for acute distal PE. No acute filling defects within the central or proximal segmental vessels to suggest acute embolus. Mild cardiomegaly. No significant pericardial effusion. Nonaneurysmal aorta. Coronary vascular calcification. Mediastinum/Nodes: Midline trachea. No thyroid mass. No significant adenopathy. Small hiatal hernia Lungs/Pleura: Lungs are clear. No pleural effusion or pneumothorax. Upper Abdomen: No acute abnormality. Musculoskeletal: Degenerative changes. No acute or suspicious abnormality Review of the MIP images confirms the above findings. IMPRESSION: 1. Limited evaluation for distal emboli due to motion degradation. No acute central PE is seen. 2. Mild cardiomegaly 3. Clear lung fields Electronically Signed  By: Donavan Foil M.D.   On: 12/25/2018 22:53    Assessment and Plan:   Mr. Smoak is a 73 y/o super morbidly obese male with a h/o NICM, chronic combined systolic and diastolic HF, nonobstructive CAD by cath in 2017, h/o syncope presumed to be VT s/p AICD (St. Jude) followed by Dr. Lovena Le, HTN, HLD, T2DM and presumed OSA, who is being seen today for atrial fibrillation, at the request of Dr. Reesa Chew, Internal Medicine.   1. Acute on Chronic Combined Systolic and Diastolic CHF:  Pt presented with significant volume overload, reported 20 lb weight gain and increased dyspnea. He admits to poor compliance with Lasix at home. Often skips doses to avoid frequent urination and also not restricting sodium at home.  Admit BNP 1,266. Currently on IV Lasix, 40 mg Q8H. ? If I/Os are being properly recorded. Only 575 cc documented today. Net I/Os +1.9L. Renal function is stable. BP has been a bit soft but stable.  Continue with diuretics for volume removal. Strict I/Os, daily weights, low sodium diet and close monitoring of renal function, electrolytes and BP.    2. New Onset Atrial Fibrillation: initial rates on admit in the 150s. Rates improved w/ IV Cardizem. Pt remains in atrial fibrillation but rates are improved, in the 80s-low 100s. IV Cardizem has been discontinued. He is on Coreg. No palpitations. He is morbidly obese and has h/o snoring, thus likely underlying OSA. He acknowledges that he needs to get a sleep study and plans to do so post discharge. TSH is normal. Electrolytes also normal. He is being treated for a/c CHF and cellulitis. Both conditions may be contributing. Continue to treat. We will continue Coreg for rate control. His CHA2DS2 VASc score is at least 5 for CHF, HTN, DM, Age 27-74 and vascular disease. This patients CHA2DS2-VASc Score and unadjusted Ischemic Stroke Rate (% per year) is equal to 7.2 % stroke rate/year from a score of 5. Recommend initiation of oral anticoagulation. Will defer agent choice to MD. Given severe obesity, presumed OSA that is not treated and severely dilated LA, doubt he would be a good candidate for cardioversion, as he would likely not stay in [place]. For now, plan rate control and anticoagulation. Recommend outpatient sleep study. Continue to monitor on tele.    3. Lower Extremity Cellulitis: antibiotics per IM. He denies fever and chills.   4. Nonobstructive CAD: He had a LHC in 2017 for low EF. Cath showed no significant obstructive CAD. There was a 50% stenosis in the 1st diag but no other lesions. He denies any recent CP. Continue ASA, statin and  blocker. LDL goal < 70 mg/dL.   5. AICD: St Jude. Followed by Dr. Lovena Le. Implanted in 2017 for syncope, presumed to be VT in the setting of NICM with EF < 35%. No ICD shocks to date.   6. HTN: controlled on current regimen. Monitor closely given IV diuretics, AV nodal blocking agents and Entresto.   7. HLD:  continue statin therapy w/ Crestor. Update FLP in the AM to ensure LDL is at recommended goal of < 70 mg/dL.   8. T2DM: management per IM.   9. Right Proxima Humerus Fx: He fell out of bed and landed on his arm (rolled over too far off the edge) on Christmas Eve. Plan is conservative management and physical therapy, per pt.   10. NICM: echo this admit shows EF at 20-25%, consistent w/ prior study. Continue Entresto and Coreg. BP too soft for spirolactone  and Bidil. He has a functioning ICD for primary prevention of SCD.     For questions or updates, please contact [place] Please consult [email or web address] for contact info under     Signed, Lyda Jester, PA-C  12/27/2018 11:34 AM As above, patient seen and examined.  Briefly he is a 73 year old male with past medical history of nonischemic cardiomyopathy, chronic systolic congestive heart failure, nonobstructive coronary disease, prior ICD, hypertension, hyperlipidemia, diabetes mellitus for evaluation of acute on chronic systolic congestive heart failure and new onset atrial fibrillation.  Patient states that for the past week he has had progressive increasing pedal edema.  He notes worsening dyspnea on exertion, orthopnea and has gained 15 pounds since mid November.  No chest pain, palpitations, fevers or chills.  Christmas Eve patient thinks he may have suffered a syncopal episode.  He fell and fractured his right humerus.  This is being conservatively managed.  Admitted and cardiology asked to evaluate.  Chest CT shows no pulmonary embolus.  Sodium 128, BUN 30 and creatinine 0.91.  Echo shows severe LV dysfunction, biatrial enlargement.  BNP 1266.  ECG shows atrial fibrillation, IVCD, cannot rule out anterior infarct, nonspecific ST changes.  1 acute on chronic systolic congestive heart failure-patient is markedly volume overloaded.  Will treat with Lasix 80 mg IV twice daily.  Follow renal function.  We discussed the importance of fluid  restriction to 1 L daily and low-sodium diet.  I also discussed the importance of daily weights at home.  2 new onset atrial fibrillation-this is likely contributing to his congestive heart failure.  Would continue carvedilol for rate control.  Add digoxin if needed.  We will discontinue Cardizem given potential negative inotropic effects. CHADSvasc 5.  Discontinue heparin.  Treat with apixaban 5 mg twice daily.  If CHF difficult to control would need to consider TEE guided cardioversion.  Otherwise could continue apixaban for 3 weeks and attempt cardioversion as an outpatient.  Note may be difficult to maintain sinus rhythm given biatrial enlargement.  May require antiarrhythmic in the future.  Note I will discontinue aspirin given need for apixaban.  3 question syncope-patient with question syncopal episode Christmas Eve resulting in humerus fracture.  We will have his device interrogated.  4 nonischemic cardiomyopathy-continue Entresto and carvedilol.  Titrate medications as needed.  5 noncompliance-patient occasionally does not take Lasix at home.  I have instructed him on the importance of medication compliance.  6 prior ICD  7 probable obstructive sleep apnea-we will need outpatient sleep evaluation.  8 cellulitis-antibiotics per primary care.  Kirk Ruths, MD

## 2018-12-27 NOTE — Progress Notes (Signed)
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  He reports he has has chronic bilateral lower extremity with erythema for a long time.  Denied any fevers and chills.  He was found to be in atrial fibrillation which appears to be new diagnosis for him.   Assessment & Plan:   Principal Problem:   Atrial fibrillation with rapid ventricular response (HCC) Active Problems:   RLS (restless legs syndrome)   Type 2 diabetes mellitus (HCC)   DOE (dyspnea on exertion)   Cellulitis   Hyponatremia   Chronic anemia   Humerus fracture   Chronic pain   HLD (hyperlipidemia)   Atrial fibrillation with RVR -Currently on Cardizem drip.  Rate needs to be better controlled.   -resume Coreg, increase dose to 6.25 mg -Currently on heparin drip.  We will plan on transitioning to Eliquis - Cardiology consulted -Echocardiogram yesterday showed ejection fraction 20-25%, mild LVH, mild MR, severely dilated left atrium, moderately dilated right atrium, mild MR.  Echo in June 2019 had similar findings  Acute respiratory distress secondary to CHF exacerbation Acute combined systolic and diastolic congestive heart failure, class III, ejection fraction 20% status post AICD -Started p.o. Cardizem for better heart rate control.  Cardiology team consulted for further assistance -In the meantime Lasix 80 mg IV ordered this morning, will continue 40 mg IV every 8 hours for 3 doses.  Monitor response -Resume Coreg at higher dose of 6.25 mg twice daily, titrate as needed.  Resume Entresto  Bilateral lower extremity chronic swelling and erythema -Erythematous  but I do not think they are infected at this time.  Will discontinue antibiotics and clinically monitor him.  Physical deconditioning secondary to morbid obesity and multiple medical comorbidities -PT/OT consulted  Anemia of chronic disease -Around baseline of 11.  No obvious signs of bleeding.  Diabetes mellitus type 2 -Hemoglobin A1c 7.6.  Proximal right humerus fracture -Previously seen by orthopedic outpatient on December 12, 2018-sling in place.  Needs to follow-up outpatient with orthopedic  Restless leg syndrome -Continue ropinirole  Hyperlipidemia -Continue Crestor  DVT prophylaxis: Heparin drip Code Status: Full Code  Family Communication:  None at bedside Disposition Plan: Maintain hospital stay another 24-48 hours for IV diuresis.  Cardiology consulted  Consultants:   Cardiology  Procedures:   None  Antimicrobials:   Vancomycin and Rocephin stopped 1/8   Subjective: Patient still feels exertional shortness of breath.  Tells me he is unable to lay flat.  Review of Systems Otherwise negative except as per HPI, including: General: Denies fever, chills, night sweats or unintended weight loss. Resp: Denies hemoptysis Cardiac: Denies chest pain, palpitations, orthopnea, paroxysmal nocturnal dyspnea. GI: Denies abdominal pain, nausea, vomiting, diarrhea or constipation GU: Denies dysuria, frequency, hesitancy or incontinence MS: Denies muscle aches, joint pain or swelling Neuro: Denies headache, neurologic deficits (focal weakness, numbness, tingling), abnormal gait Psych: Denies anxiety, depression, SI/HI/AVH Skin: Denies new rashes or lesions ID: Denies sick contacts, exotic exposures, travel  Objective: Vitals:   12/26/18 1304 12/26/18 2018 12/27/18 0010 12/27/18 0335  BP: 93/67 111/68 106/73 121/80  Pulse:  88 79 99  Resp: (!) 29 (!) 21 20 (!) 21  Temp:  97.9 F (36.6 C) 97.8 F (36.6 C) 98.7 F (37.1 C) 97.6 F (36.4 C)  TempSrc: Oral Oral Oral  Oral  SpO2: 98% 100% 99% 100%  Weight: (!) 140.6 kg   (!) 140.6 kg  Height: 5\' 5"  (1.651 m)       Intake/Output Summary (Last 24 hours) at 12/27/2018 1143 Last data filed at 12/27/2018 1117 Gross per 24 hour  Intake 1212.27 ml  Output 575 ml  Net 637.27 ml   Filed Weights   12/25/18 1505 12/26/18 1304 12/27/18 0335  Weight: 136.1 kg (!) 140.6 kg (!) 140.6 kg    Examination:  General exam: Appears calm and comfortable, morbid obesity Respiratory system: Bilateral diminished breath sounds especially at the bases Cardiovascular system: S1 & S2 heard, RRR. No JVD, murmurs, rubs, gallops or clicks.  3+ bilateral lower extremity pitting edema Gastrointestinal system: Abdomen is nondistended, soft and nontender. No organomegaly or masses felt. Normal bowel sounds heard. Central nervous system: Alert and oriented. No focal neurological deficits. Extremities: Symmetric 4 x 5 power. Skin: Bilateral lower extremity chronic skin changes with erythema but no warmth noted. Psychiatry: Judgement and insight appear normal. Mood & affect appropriate.     Data Reviewed:   CBC: Recent Labs  Lab 12/25/18 1511 12/25/18 1927 12/27/18 0525  WBC 6.6 8.1 7.9  NEUTROABS  --  6.4  --   HGB 10.7* 11.2* 11.3*  HCT 34.6* 36.0* 35.2*  MCV 82.4 82.6 82.6  PLT 213 239 419   Basic Metabolic Panel: Recent Labs  Lab 12/25/18 1511 12/25/18 1927 12/26/18 0757  NA 128* 128* 128*  K 4.8 4.7 5.0  CL 96* 96* 96*  CO2 22 23 19*  GLUCOSE 171* 167* 200*  BUN 32* 30* 30*  CREATININE 0.88 0.80 0.91  CALCIUM 8.3* 8.4* 8.0*   GFR: Estimated Creatinine Clearance: 95.2 mL/min (by C-G formula based on SCr of 0.91 mg/dL). Liver Function Tests: Recent Labs  Lab 12/25/18 1927  AST 17  ALT 15  ALKPHOS 116  BILITOT 0.9  PROT 6.7  ALBUMIN 3.3*   No results for input(s): LIPASE, AMYLASE in the last 168 hours. No results for input(s): AMMONIA in the last 168 hours. Coagulation Profile: No results for  input(s): INR, PROTIME in the last 168 hours. Cardiac Enzymes: No results for input(s): CKTOTAL, CKMB, CKMBINDEX, TROPONINI in the last 168 hours. BNP (last 3 results) No results for input(s): PROBNP in the last 8760 hours. HbA1C: Recent Labs    12/26/18 0757  HGBA1C 7.6*   CBG: Recent Labs  Lab 12/26/18 0744 12/26/18 1128 12/26/18 1628 12/27/18 0849  GLUCAP 182* 183* 195* 169*   Lipid Profile: No results for input(s): CHOL, HDL, LDLCALC, TRIG, CHOLHDL, LDLDIRECT in the last 72 hours. Thyroid Function Tests: Recent Labs    12/26/18 0757  TSH 1.867   Anemia Panel: No results for input(s): VITAMINB12, FOLATE, FERRITIN, TIBC, IRON, RETICCTPCT in the last 72 hours. Sepsis Labs: Recent Labs  Lab 12/25/18 1937  LATICACIDVEN 1.48    Recent Results (from the past 240 hour(s))  Blood Culture (routine x 2)     Status: None (Preliminary result)   Collection Time: 12/25/18  7:16 PM  Result Value Ref Range Status   Specimen Description BLOOD LEFT ANTECUBITAL  Final   Special Requests   Final    BOTTLES DRAWN AEROBIC AND ANAEROBIC Blood Culture results may not be optimal due to an excessive volume of blood received in culture bottles   Culture  Final    NO GROWTH 2 DAYS Performed at [facility], [place] [address], [place], [place] [address]    Report Status PENDING  Incomplete  Blood Culture (routine x 2)     Status: None (Preliminary result)   Collection Time: 12/25/18  7:42 PM  Result Value Ref Range Status   Specimen Description BLOOD LEFT HAND  Final   Special Requests   Final    BOTTLES DRAWN AEROBIC AND ANAEROBIC Blood Culture results may not be optimal due to an inadequate volume of blood received in culture bottles   Culture   Final    NO GROWTH 2 DAYS Performed at [facility], [place] [address], [place], [place] [address]    Report Status PENDING  Incomplete         Radiology Studies: Dg Chest 2 View  Result Date: 12/25/2018 CLINICAL DATA:   Shortness of breath and fluid retention. EXAM: CHEST - 2 VIEW COMPARISON:  Chest x-ray dated June 27, 2018. FINDINGS: Unchanged left chest wall pacemaker. Stable cardiomegaly. Normal pulmonary vascularity. Low lung volumes. No focal consolidation, pleural effusion, or pneumothorax. No acute osseous abnormality. IMPRESSION: 1. Stable cardiomegaly.  No active cardiopulmonary disease. Electronically Signed   By: Titus Dubin M.D.   On: 12/25/2018 16:05   Ct Angio Chest Pe W/cm &/or Wo Cm  Result Date: 12/25/2018 CLINICAL DATA:  Bilateral leg swelling with shortness of breath EXAM: CT ANGIOGRAPHY CHEST WITH CONTRAST TECHNIQUE: Multidetector CT imaging of the chest was performed using the standard protocol during bolus administration of intravenous contrast. Multiplanar CT image reconstructions and MIPs were obtained to evaluate the vascular anatomy. CONTRAST:  94 mL ISOVUE-370 IOPAMIDOL (ISOVUE-370) INJECTION 76% COMPARISON:  Chest radiograph 12/25/2018, CT chest 08/01/2010 FINDINGS: Cardiovascular: Motion degradation limits evaluation for acute distal PE. No acute filling defects within the central or proximal segmental vessels to suggest acute embolus. Mild cardiomegaly. No significant pericardial effusion. Nonaneurysmal aorta. Coronary vascular calcification. Mediastinum/Nodes: Midline trachea. No thyroid mass. No significant adenopathy. Small hiatal hernia Lungs/Pleura: Lungs are clear. No pleural effusion or pneumothorax. Upper Abdomen: No acute abnormality. Musculoskeletal: Degenerative changes. No acute or suspicious abnormality Review of the MIP images confirms the above findings. IMPRESSION: 1. Limited evaluation for distal emboli due to motion degradation. No acute central PE is seen. 2. Mild cardiomegaly 3. Clear lung fields Electronically Signed   By: Donavan Foil M.D.   On: 12/25/2018 22:53        Scheduled Meds: . aspirin EC  81 mg Oral Daily  . calcium-vitamin D  1 tablet Oral QHS  .  docusate sodium  100 mg Oral BID  . FLUoxetine  20 mg Oral Daily  . furosemide  40 mg Intravenous Q8H  . hydrocerin   Topical BID  . insulin aspart  0-20 Units Subcutaneous TID WC  . rOPINIRole  4 mg Oral QHS  . rosuvastatin  20 mg Oral q1800   Continuous Infusions: . dilTIAZem HCl-Dextrose Stopped (12/26/18 0809)  . heparin 1,500 Units/hr (12/27/18 0500)     LOS: 0 days   Time spent= 51mins    Albin Duckett Arsenio Loader, MD [facility]  If 7PM-7AM, please contact night-coverage [email or web address] 12/27/2018, 11:43 AM

## 2018-12-27 NOTE — Clinical Social Work Note (Signed)
Clinical Social Work Assessment  Patient Details  Name: Jeffery Nelson MRN: [id number] Date of Birth: 12/21/1945  Date of referral:  12/27/18               Reason for consult:  Facility Placement, Discharge Planning                Permission sought to share information with:  Facility Art therapist granted to share information::  Yes, Verbal Permission Granted  Name::        Agency::  SNFs  Relationship::     Contact Information:     Housing/Transportation Living arrangements for the past 2 months:  Single Family Home Source of Information:  Patient Patient Interpreter Needed:  None Criminal Activity/Legal Involvement Pertinent to Current Situation/Hospitalization:  No - Comment as needed Significant Relationships:  Adult Children Lives with:  Self Do you feel safe going back to the place where you live?  Yes Need for family participation in patient care:  No (Coment)  Care giving concerns: Patient from home independently. PT recommending SNF.   Social Worker assessment / plan: CSW met with patient at bedside. Patient alert and oriented. CSW introduced self and role and discussed disposition planning - PT recommendation for SNF.  Patient reports he lives alone and is normally able to complete ADLs on his own. He has a walker and a ramp into his house. He has had more difficulty lately due to health problems.   Patient is agreeable to go to rehab at [facility]. Patient has been to [facility] in the past, but would prefer to go to [facility] this time because it's closer to his house.   CSW sent out initial SNF referrals. Will provide bed offers and CMS SNF list when available. Patient will need [facility] authorization prior to admitting to a facility. Facility will initiate auth when identified. CSW to follow and support with discharge planning.  Employment status:  Retired Research officer, political party) PT Recommendations:  [place] / Referral to community resources:  [place]  Patient/Family's Response to care: Patient appreciative of care.  Patient/Family's Understanding of and Emotional Response to Diagnosis, Current Treatment, and Prognosis: Patient with understanding of his conditions and agreeable to rehab at [facility].  Emotional Assessment Appearance:  Appears stated age Attitude/Demeanor/Rapport:  Engaged Affect (typically observed):  Accepting, Calm, Pleasant, Appropriate Orientation:  Oriented to Self, Oriented to Place, Oriented to  Time, Oriented to Situation Alcohol / Substance use:  Not Applicable Psych involvement (Current and /or in the community):  No (Comment)  Discharge Needs  Concerns to be addressed:  Discharge Planning Concerns, Care Coordination Readmission within the last 30 days:  No Current discharge risk:  Physical Impairment Barriers to Discharge:  Continued Medical Work up, [place], LCSW 12/27/2018, 12:01 PM

## 2018-12-28 DIAGNOSIS — Z9114 Patient's other noncompliance with medication regimen: Secondary | ICD-10-CM

## 2018-12-28 DIAGNOSIS — R0609 Other forms of dyspnea: Secondary | ICD-10-CM

## 2018-12-28 LAB — BASIC METABOLIC PANEL
Anion gap: 10 (ref 5–15)
BUN: 26 mg/dL — ABNORMAL HIGH (ref 8–23)
CO2: 22 mmol/L (ref 22–32)
Calcium: 8 mg/dL — ABNORMAL LOW (ref 8.9–10.3)
Chloride: 99 mmol/L (ref 98–111)
Creatinine, Ser: 0.87 mg/dL (ref 0.61–1.24)
GFR calc Af Amer: >60 mL/min (ref >60)
GFR calc non Af Amer: >60 mL/min (ref >60)
Glucose, Bld: 155 mg/dL — ABNORMAL HIGH (ref 70–99)
Potassium: 4.3 mmol/L (ref 3.5–5.1)
Sodium: 131 mmol/L — ABNORMAL LOW (ref 135–145)

## 2018-12-28 LAB — COMPREHENSIVE METABOLIC PANEL
ALK PHOS: 94 U/L (ref 38–126)
ALT: 47 U/L — ABNORMAL HIGH (ref 0–44)
AST: 38 U/L (ref 15–41)
Albumin: 2.6 g/dL — ABNORMAL LOW (ref 3.5–5.0)
Anion gap: 8 (ref 5–15)
BUN: 27 mg/dL — ABNORMAL HIGH (ref 8–23)
CALCIUM: 7.9 mg/dL — AB (ref 8.9–10.3)
CO2: 23 mmol/L (ref 22–32)
Chloride: 99 mmol/L (ref 98–111)
Creatinine, Ser: 0.89 mg/dL (ref 0.61–1.24)
GFR calc Af Amer: >60 mL/min (ref >60)
GFR calc non Af Amer: >60 mL/min (ref >60)
GLUCOSE: 117 mg/dL — AB (ref 70–99)
Potassium: 4.1 mmol/L (ref 3.5–5.1)
SODIUM: 130 mmol/L — AB (ref 135–145)
TOTAL PROTEIN: 5.4 g/dL — AB (ref 6.5–8.1)
Total Bilirubin: 1 mg/dL (ref 0.3–1.2)

## 2018-12-28 LAB — LIPID PANEL
Cholesterol: 80 mg/dL (ref 0–200)
HDL: 28 mg/dL — ABNORMAL LOW (ref >40)
LDL Cholesterol: 40 mg/dL (ref 0–99)
Total CHOL/HDL Ratio: 2.9 RATIO
Triglycerides: 59 mg/dL (ref <150)
VLDL: 12 mg/dL (ref 0–40)

## 2018-12-28 LAB — GLUCOSE, CAPILLARY
GLUCOSE-CAPILLARY: 118 mg/dL — AB (ref 70–99)
GLUCOSE-CAPILLARY: 110 mg/dL — AB (ref 70–99)
GLUCOSE-CAPILLARY: 115 mg/dL — AB (ref 70–99)
Glucose-Capillary: 159 mg/dL — ABNORMAL HIGH (ref 70–99)

## 2018-12-28 LAB — MAGNESIUM
Magnesium: 1.8 mg/dL (ref 1.7–2.4)
Magnesium: 1.7 mg/dL (ref 1.7–2.4)

## 2018-12-28 LAB — CBC
HCT: 30.7 % — ABNORMAL LOW (ref 39.0–52.0)
Hemoglobin: 10 g/dL — ABNORMAL LOW (ref 13.0–17.0)
MCH: 26.7 pg (ref 26.0–34.0)
MCHC: 32.6 g/dL (ref 30.0–36.0)
MCV: 81.9 fL (ref 80.0–100.0)
Platelets: 228 10*3/uL (ref 150–400)
RBC: 3.75 MIL/uL — ABNORMAL LOW (ref 4.22–5.81)
RDW: 15.1 % (ref 11.5–15.5)
WBC: 8.3 10*3/uL (ref 4.0–10.5)
nRBC: 0 % (ref 0.0–0.2)

## 2018-12-28 LAB — BRAIN NATRIURETIC PEPTIDE: B Natriuretic Peptide: 1379.5 pg/mL — ABNORMAL HIGH (ref 0.0–100.0)

## 2018-12-28 MED ORDER — MAGNESIUM SULFATE 2 GM/50ML IV SOLN
2.0000 g | Freq: Once | INTRAVENOUS | Status: AC
  Administered 2018-12-28: 2 g via INTRAVENOUS
  Filled 2018-12-28: qty 50

## 2018-12-28 NOTE — Evaluation (Addendum)
Occupational Therapy Evaluation Patient Details Name: Jeffery Nelson MRN: [id number] DOB: [birth year] Today's Date: 12/28/2018    History of Present Illness Patient is a 73 y/o male who was sent from his cardiologist due to SOB and weight gain. Found to have new onset A-fib and RLE cellulitis. Recent RUE fx on 12/24 conservative management in sling. PMH includes hip replacement 2015, DM, HTN, CHF, defibrillator, syncope, venous stasis BLEss, presents with SOB and    Clinical Impression   Pt is a 73 yo male s/p above diagnoses. PTA: pt was independent with assist from sister checking in for ADLs. Pt performing ADL MinA for UB and MaxA for LB ADL. Pt ModA for bed mobility and minguardA for mobility with LUE using RW for stability. Pt elbow, wrist and hand for RUE, WFLs from recent fx. Pt decreased mobility and inability to care for self, recommend SNF setting to increase ADL activity, endurance and reduce pain in RUE. Pt in sling and repositioned in chair for comfort. HR 90-126 BPM throughout session. Thank you for this referral.    Follow Up Recommendations  SNF;Supervision - Intermittent    Equipment Recommendations  3 in 1 bedside commode    Recommendations for Other Services       Precautions / Restrictions Precautions Precautions: Fall Precaution Comments: watch HR, A-fib Required Braces or Orthoses: Sling Restrictions Weight Bearing Restrictions: Yes RUE Weight Bearing: Non weight bearing Other Position/Activity Restrictions: per pt from orthopedic MD      Mobility Bed Mobility Overal bed mobility: Needs Assistance Bed Mobility: Supine to Sit;Sit to Supine     Supine to sit: Mod assist;HOB elevated Sit to supine: Mod assist   General bed mobility comments: Assist with trunk elevation and moving hips over with pad.  Transfers Overall transfer level: Needs assistance Equipment used: Rolling walker (2 wheeled) Transfers: Sit to/from Stand Sit to Stand: Min assist;From  elevated surface         General transfer comment: verbal cues for nose over toes    Balance Overall balance assessment: Needs assistance;History of Falls Sitting-balance support: Feet supported;Single extremity supported Sitting balance-Leahy Scale: Fair       Standing balance-Leahy Scale: Fair Standing balance comment: Requires 1 UE support in standing for dynamic tasks.                           ADL either performed or assessed with clinical judgement   ADL Overall ADL's : Needs assistance/impaired Eating/Feeding: Set up;Bed level   Grooming: Wash/dry hands;Oral care;Wash/dry face;Set up;Sitting   Upper Body Bathing: Sitting;Cueing for safety;Cueing for sequencing;Minimal assistance;Standing   Lower Body Bathing: Maximal assistance;Sitting/lateral leans   Upper Body Dressing : Minimal assistance;Sitting;Cueing for sequencing   Lower Body Dressing: Maximal assistance;Sitting/lateral leans;Sit to/from stand   Toilet Transfer: Minimal assistance;BSC   Toileting- Clothing Manipulation and Hygiene: Maximal assistance;Sitting/lateral lean;Sit to/from stand       Functional mobility during ADLs: Min guard;Rolling walker(refused to try [facility]) General ADL Comments: requires minA for UB and maxA for LB ADL     Vision Baseline Vision/History: No visual deficits Vision Assessment?: No apparent visual deficits     Perception     Praxis      Pertinent Vitals/Pain Pain Assessment: No/denies pain     Hand Dominance Right   Extremity/Trunk Assessment Upper Extremity Assessment Upper Extremity Assessment: Generalized weakness;RUE deficits/detail RUE Deficits / Details: RUE humeral fx 12/24- conservative trx in sling. had to repositioned and pt  able to move elbow, wrist and hand ROM   Lower Extremity Assessment Lower Extremity Assessment: Generalized weakness RLE Deficits / Details: swelling and weeping   Cervical / Trunk Assessment Cervical / Trunk  Assessment: Normal   Communication Communication Communication: No difficulties   Cognition Arousal/Alertness: Awake/alert Behavior During Therapy: WFL for tasks assessed/performed Overall Cognitive Status: Within Functional Limits for tasks assessed                                     General Comments  O2 levels  >90% on RA; HR  <125 BPM post exertion.    Exercises Exercises: Other exercises Other Exercises Other Exercises: RUE elbow, wrist and hand exercises ROM   Shoulder Instructions      Home Living Family/patient expects to be discharged to:: Skilled nursing facility Living Arrangements: Alone Available Help at Discharge: Family;Available PRN/intermittently Type of Home: Mobile home Home Access: Ramped entrance     Home Layout: One level     Bathroom Shower/Tub: Teacher, early years/pre: Standard     Home Equipment: Environmental consultant - 2 wheels;Walker - 4 wheels          Prior Functioning/Environment Level of Independence: Independent with assistive device(s)        Comments: uses RW PRN for household ambulation and rollator for community ambulation. Normally independent for ADLs but has had trouble since RUE fx in December, Sister helps with a lot recently. Drives prior to fx as well. Reports episodes of falling asleep sitting up and sliding onto floor- awaiting sleep study.        OT Problem List: Decreased strength;Decreased activity tolerance;Impaired balance (sitting and/or standing);Decreased coordination;Decreased safety awareness;Obesity;Increased edema;Pain;Impaired UE functional use      OT Treatment/Interventions: Self-care/ADL training;Therapeutic exercise;Neuromuscular education;Energy conservation;Therapeutic activities;Balance training;Patient/family education;Visual/perceptual remediation/compensation    OT Goals(Current goals can be found in the care plan section) Acute Rehab OT Goals Patient Stated Goal: to go to rehab to  get stronger OT Goal Formulation: With patient Time For Goal Achievement: 01/18/19 Potential to Achieve Goals: Good  OT Frequency: Min 2X/week   Barriers to D/C: Decreased caregiver support          Co-evaluation              AM-PAC OT "6 Clicks" Daily Activity     Outcome Measure Help from another person eating meals?: A Little Help from another person taking care of personal grooming?: A Little Help from another person toileting, which includes using toliet, bedpan, or urinal?: A Lot Help from another person bathing (including washing, rinsing, drying)?: A Lot Help from another person to put on and taking off regular upper body clothing?: A Lot Help from another person to put on and taking off regular lower body clothing?: A Lot 6 Click Score: 14   End of Session Equipment Utilized During Treatment: Surveyor, mining Communication: Mobility status  Activity Tolerance: Patient tolerated treatment well;Patient limited by pain Patient left: in chair;with chair alarm set;with call bell/phone within reach  OT Visit Diagnosis: Unsteadiness on feet (R26.81);Repeated falls (R29.6);Muscle weakness (generalized) (M62.81)                Time: 2297-9892 OT Time Calculation (min): 54 min Charges:  OT General Charges $OT Visit: 1 Visit OT Evaluation $OT Eval Moderate Complexity: 1 Mod OT Treatments $Self Care/Home Management : 23-37 mins  (MD was in room >10 mins subtracted  from my visit)  Ebony Hail Harold Hedge) Marsa Aris OTR/L Acute Rehabilitation Services Pager: [phone] Office: [phone]   Fredda Hammed 12/28/2018, 10:16 AM

## 2018-12-28 NOTE — Social Work (Signed)
Provided SNF bed offers to patient at bedside. Will follow for choice and to start [facility] authorization at facility of choice. Patient will need auth prior to admitting to a facility.  Estanislado Emms, LCSW [phone]

## 2018-12-28 NOTE — Discharge Instructions (Signed)

## 2018-12-28 NOTE — Progress Notes (Addendum)
Progress Note  Patient Name: Jeffery Nelson Date of Encounter: 12/28/2018  Primary Cardiologist:  Pixie Casino, MD  Subjective   Breathing ok, admits to poor compliance w/ meds and daily wts, not paying attn to Na in foods. Sister shops for him.   Inpatient Medications    Scheduled Meds: . apixaban  5 mg Oral BID  . calcium-vitamin D  1 tablet Oral QHS  . carvedilol  6.25 mg Oral BID WC  . docusate sodium  100 mg Oral BID  . FLUoxetine  20 mg Oral Daily  . furosemide  80 mg Intravenous BID  . hydrocerin   Topical BID  . insulin aspart  0-20 Units Subcutaneous TID WC  . rOPINIRole  4 mg Oral QHS  . rosuvastatin  20 mg Oral q1800  . sacubitril-valsartan  1 tablet Oral BID   Continuous Infusions:  PRN Meds: acetaminophen, HYDROcodone-acetaminophen, methocarbamol   Vital Signs    Vitals:   12/27/18 1424 12/27/18 1942 12/28/18 0025 12/28/18 0624  BP: 101/74 (!) 89/62 (!) 81/60 (!) 83/55  Pulse: (!) 108 70 79 81  Resp:  18 19 17   Temp:  97.8 F (36.6 C) 97.8 F (36.6 C) 98 F (36.7 C)  TempSrc:  Oral Oral Oral  SpO2:  98% 95% 96%  Weight:    (!) 139.9 kg  Height:        Intake/Output Summary (Last 24 hours) at 12/28/2018 0842 Last data filed at 12/28/2018 0624 Gross per 24 hour  Intake 94.21 ml  Output 1300 ml  Net -1205.79 ml   Filed Weights   12/26/18 1304 12/27/18 0335 12/28/18 0624  Weight: (!) 140.6 kg (!) 140.6 kg (!) 139.9 kg    Telemetry    Atrial fib, rate controlled. 4 bt run NSVT - Personally Reviewed  ECG    None today - Personally Reviewed  Physical Exam   General: Well developed, well nourished, male appearing in no acute distress. Head: Normocephalic, atraumatic.  Neck: Supple without bruits, JVD elevated, prob 10 cm. Lungs:  Resp regular and unlabored, decreased BS bases w/ rales. Heart: irreg R&R, S1, S2, no S3, S4, 2/6 murmur; no rub. Abdomen: Soft, non-tender, non-distended with normoactive bowel sounds. No hepatomegaly. No  rebound/guarding. No obvious abdominal masses. Extremities: No clubbing, cyanosis, 1-2 + edema. Distal pedal pulses are 2+ bilaterally. Neuro: Alert and oriented X 3. Moves all extremities spontaneously. Psych: Normal affect.  Labs    Hematology Recent Labs  Lab 12/25/18 1927 12/27/18 0525 12/28/18 0319  WBC 8.1 7.9 8.3  RBC 4.36 4.26 3.75*  HGB 11.2* 11.3* 10.0*  HCT 36.0* 35.2* 30.7*  MCV 82.6 82.6 81.9  MCH 25.7* 26.5 26.7  MCHC 31.1 32.1 32.6  RDW 15.2 15.3 15.1  PLT 239 223 228    Chemistry Recent Labs  Lab 12/25/18 1927 12/26/18 0757 12/28/18 0319  NA 128* 128* 130*  K 4.7 5.0 4.1  CL 96* 96* 99  CO2 23 19* 23  GLUCOSE 167* 200* 117*  BUN 30* 30* 27*  CREATININE 0.80 0.91 0.89  CALCIUM 8.4* 8.0* 7.9*  PROT 6.7  --  5.4*  ALBUMIN 3.3*  --  2.6*  AST 17  --  38  ALT 15  --  47*  ALKPHOS 116  --  94  BILITOT 0.9  --  1.0  GFRNONAA >60 >60 >60  GFRAA >60 >60 >60  ANIONGAP 9 13 8      Cardiac Enzymes  Recent Labs  Lab  12/25/18 1538  TROPIPOC 0.02     BNP Recent Labs  Lab 12/25/18 1927 12/28/18 0319  BNP 1,266.8* 1,379.5*     Radiology    Dg Chest 2 View  Result Date: 12/25/2018 CLINICAL DATA:  Shortness of breath and fluid retention. EXAM: CHEST - 2 VIEW COMPARISON:  Chest x-ray dated June 27, 2018. FINDINGS: Unchanged left chest wall pacemaker. Stable cardiomegaly. Normal pulmonary vascularity. Low lung volumes. No focal consolidation, pleural effusion, or pneumothorax. No acute osseous abnormality. IMPRESSION: 1. Stable cardiomegaly.  No active cardiopulmonary disease. Electronically Signed   By: Titus Dubin M.D.   On: 12/25/2018 16:05   Ct Angio Chest Pe W/cm &/or Wo Cm  Result Date: 12/25/2018 CLINICAL DATA:  Bilateral leg swelling with shortness of breath EXAM: CT ANGIOGRAPHY CHEST WITH CONTRAST TECHNIQUE: Multidetector CT imaging of the chest was performed using the standard protocol during bolus administration of intravenous contrast.  Multiplanar CT image reconstructions and MIPs were obtained to evaluate the vascular anatomy. CONTRAST:  94 mL ISOVUE-370 IOPAMIDOL (ISOVUE-370) INJECTION 76% COMPARISON:  Chest radiograph 12/25/2018, CT chest 08/01/2010 FINDINGS: Cardiovascular: Motion degradation limits evaluation for acute distal PE. No acute filling defects within the central or proximal segmental vessels to suggest acute embolus. Mild cardiomegaly. No significant pericardial effusion. Nonaneurysmal aorta. Coronary vascular calcification. Mediastinum/Nodes: Midline trachea. No thyroid mass. No significant adenopathy. Small hiatal hernia Lungs/Pleura: Lungs are clear. No pleural effusion or pneumothorax. Upper Abdomen: No acute abnormality. Musculoskeletal: Degenerative changes. No acute or suspicious abnormality Review of the MIP images confirms the above findings. IMPRESSION: 1. Limited evaluation for distal emboli due to motion degradation. No acute central PE is seen. 2. Mild cardiomegaly 3. Clear lung fields Electronically Signed   By: Donavan Foil M.D.   On: 12/25/2018 22:53     Cardiac Studies   ECHO:  12/26/2018 - Left ventricle: The cavity size was mildly dilated. Wall   thickness was increased in a pattern of mild LVH. Systolic   function was severely reduced. The estimated ejection fraction   was in the range of 20% to 25%. Wall motion was normal; there   were no regional wall motion abnormalities. The study was not   technically sufficient to allow evaluation of LV diastolic   dysfunction due to atrial fibrillation. - Aortic valve: Trileaflet; mildly thickened, mildly calcified   leaflets. - Mitral valve: Calcified annulus. There was mild regurgitation. - Left atrium: The atrium was severely dilated. - Right atrium: The atrium was moderately dilated. - Tricuspid valve: There was mild regurgitation. - Pulmonary arteries: PA peak pressure: 31 mm Hg (S).   Patient Profile     73 y.o. male w/ hx NICM, chronic  combined systolic and diastolic HF, nonobstructive CAD by cath in 2017, h/o syncope presumed to be VT s/p AICD ([employer]) followed by Dr. Lovena Le, HTN, HLD, T2DM, S-D-CHF, and presumed OSA, broken R humerus after a fall 12/12/2018 was admitted 12/26/2018 w/ acute on chronic S-D-CHF and Afib (new dx). Cards saw 01/08  Assessment & Plan     Principal Problem: 1.   Atrial fibrillation with rapid ventricular response (HCC) - initial HR 150s>>IV Cardizem>>HR improved>>d/c'd.  - HR controlled on Coreg - prob OSA>>needs sleep study - CHA2DS2-VASc = 5 (CHF, HTN, DM, age x 1, vasc dz) - Eliquis 5 mg bid added - severe LA dilatation, rate control likely best option - can add digoxin prn, discuss w/ MD - of note, when he came to ER 12/24, was in atrial fib  at that time.  2. Acute on chronic combined CHF - Pt admits to poor compliance w/ sodium restriction and skips Lasix regularly - continue to educate on low Na diet and fluid restriction plus med compliance - I/O net +696 ml since admit, mostly from IV rx, continue to follow - wt down 1 lb since admit, now 308 - was 282 lbs 09/2018, however believe there has been some actual weight gain, plus fluid, dry weight unclear. He had not been weighing PTA.  - continue Lasix 80 mg IV BID for now, daily BMET  3. Possible syncope 12/24 leading to fall and humerus fx>>device interrogation - interrogation reviewed, Corvue had been trending down but jumped up about 12/23, now below reference impedence - V paced < 1%, no VF episodes since 01/01, had NSVT x 4 sec on 01/01 - MD to review, but do not see any episodes 12/24  4. Prob OSA - Will arrange sleep study as outpt  5. NICM - on Entresto and Coreg  Otherwise, per IM Active Problems:   RLS (restless legs syndrome)   Type 2 diabetes mellitus (HCC)   DOE (dyspnea on exertion)   Cellulitis   Hyponatremia   Chronic anemia   Humerus fracture   Chronic pain   HLD (hyperlipidemia)   Atrial  fibrillation with RVR (HCC)    Jonetta Speak , PA-C 8:42 AM 12/28/2018 Pager: [phone]

## 2018-12-28 NOTE — Progress Notes (Signed)
PROGRESS NOTE    Jeffery Nelson  MLY:[id number] DOB: Nov 13, 1946 DOA: 12/25/2018 PCP: [name], MD   Brief Narrative:  73 year old with history of chronic combined systolic and diastolic congestive heart failure status post AICD, diabetes mellitus type 2, essential hypertension came to the hospital with complaints of recent weight gain and progressive shortness of breath especially with laying flat.  He reports he has has chronic bilateral lower extremity with erythema for a long time.  Denied any fevers and chills.  He was found to be in atrial fibrillation which appears to be new diagnosis for him.   Assessment & Plan:   Principal Problem:   Atrial fibrillation with rapid ventricular response (HCC) Active Problems:   RLS (restless legs syndrome)   Type 2 diabetes mellitus (HCC)   DOE (dyspnea on exertion)   Cellulitis   Hyponatremia   Chronic anemia   Humerus fracture   Chronic pain   HLD (hyperlipidemia)   Atrial fibrillation with RVR (HCC)   Persistent Atrial fibrillation with RVR; rate is better controlled.  -Currently on Cardizem drip.  Rate needs to be better controlled.   -resume Coreg, increase dose to 6.25 mg -Eliquis 5 mg twice daily - Cardiology following -Echocardiogram yesterday showed ejection fraction 20-25%, mild LVH, mild MR, severely dilated left atrium, moderately dilated right atrium, mild MR.  Echo in June 2019 had similar findings  Acute respiratory distress secondary to CHF exacerbation Acute combined systolic and diastolic congestive heart failure, class III, ejection fraction 20% status post AICD -Rate controlled on Coreg. -Lasix 80 mg IV twice daily, closely monitor electrolytes. Monitor BP -Resume Entresto  Bilateral lower extremity chronic swelling and erythema -Erythematous but I do not think they are infected at this time.  Will discontinue antibiotics and clinically monitor him.  Physical deconditioning secondary to morbid obesity and  multiple medical comorbidities -PT/OT consulted- rec SNF  Anemia of chronic disease -Around baseline of 11.  No obvious signs of bleeding.  Diabetes mellitus type 2 -Hemoglobin A1c 7.6.  Proximal right humerus fracture -Previously seen by orthopedic outpatient on December 12, 2018-sling in place.  Needs to follow-up outpatient with orthopedic  Restless leg syndrome -Continue ropinirole  Hyperlipidemia -Continue Crestor  DVT prophylaxis: Heparin drip Code Status: Full Code  Family Communication:  None at bedside Disposition Plan: Maintain one more day of IV diuretics. Monitor response. Then will slowly transition him to SNF   Consultants:   Cardiology  Procedures:   None  Antimicrobials:   Vancomycin and Rocephin stopped 1/8   Subjective: States he is little better. Able to slightly sleep better but no completely back to the baseline.   Review of Systems Otherwise negative except as per HPI, including: General = no fevers, chills, dizziness, malaise, fatigue HEENT/EYES = negative for pain, redness, loss of vision, double vision, blurred vision, loss of hearing, sore throat, hoarseness, dysphagia Cardiovascular= negative for chest pain, palpitation, murmurs, lower extremity swelling Respiratory/lungs= negative for  cough, hemoptysis, wheezing, mucus production Gastrointestinal= negative for nausea, vomiting,, abdominal pain, melena, hematemesis Genitourinary= negative for Dysuria, Hematuria, Change in Urinary Frequency MSK = Negative for arthralgia, myalgias, Back Pain, Joint swelling  Neurology= Negative for headache, seizures, numbness, tingling  Psychiatry= Negative for anxiety, depression, suicidal and homocidal ideation Allergy/Immunology= Medication/Food allergy as listed  Skin= Negative for Rash, lesions, ulcers, itching   Objective: Vitals:   12/27/18 1942 12/28/18 0025 12/28/18 0624 12/28/18 0943  BP: (!) 89/62 (!) 81/60 (!) 83/55 97/65  Pulse: 70 79  81 89  Resp: 18 19 17 19   Temp: 97.8 F (36.6 C) 97.8 F (36.6 C) 98 F (36.7 C)   TempSrc: Oral Oral Oral   SpO2: 98% 95% 96% 94%  Weight:   (!) 139.9 kg   Height:        Intake/Output Summary (Last 24 hours) at 12/28/2018 1215 Last data filed at 12/28/2018 1104 Gross per 24 hour  Intake -  Output 1825 ml  Net -1825 ml   Filed Weights   12/26/18 1304 12/27/18 0335 12/28/18 0624  Weight: (!) 140.6 kg (!) 140.6 kg (!) 139.9 kg    Examination: Constitutional: NAD, calm, comfortable Eyes: PERRL, lids and conjunctivae normal ENMT: Mucous membranes are moist. Posterior pharynx clear of any exudate or lesions.Normal dentition.  Neck: normal, supple, no masses, no thyromegaly Respiratory: Still has bibasilar crackles Cardiovascular: Regular rate and rhythm, no murmurs / rubs / gallops.  2+ bilateral lower extremity pitting edema with erythema but no warmth noted.  Chronic stasis.  2+ pedal pulses. No carotid bruits.  Abdomen: no tenderness, no masses palpated. No hepatosplenomegaly. Bowel sounds positive.  Musculoskeletal: no clubbing / cyanosis. No joint deformity upper and lower extremities. Good ROM, no contractures. Normal muscle tone.  Skin: Chronic bilateral lower extremity venous stasis skin changes. Neurologic: CN 2-12 grossly intact. Sensation intact, DTR normal. Strength 4/5 in all 4.  Psychiatric: Normal judgment and insight. Alert and oriented x 3. Normal mood.      Data Reviewed:   CBC: Recent Labs  Lab 12/25/18 1511 12/25/18 1927 12/27/18 0525 12/28/18 0319  WBC 6.6 8.1 7.9 8.3  NEUTROABS  --  6.4  --   --   HGB 10.7* 11.2* 11.3* 10.0*  HCT 34.6* 36.0* 35.2* 30.7*  MCV 82.4 82.6 82.6 81.9  PLT 213 239 223 130   Basic Metabolic Panel: Recent Labs  Lab 12/25/18 1511 12/25/18 1927 12/26/18 0757 12/28/18 0319  NA 128* 128* 128* 130*  K 4.8 4.7 5.0 4.1  CL 96* 96* 96* 99  CO2 22 23 19* 23  GLUCOSE 171* 167* 200* 117*  BUN 32* 30* 30* 27*  CREATININE  0.88 0.80 0.91 0.89  CALCIUM 8.3* 8.4* 8.0* 7.9*  MG  --   --   --  1.8   GFR: Estimated Creatinine Clearance: 97.1 mL/min (by C-G formula based on SCr of 0.89 mg/dL). Liver Function Tests: Recent Labs  Lab 12/25/18 1927 12/28/18 0319  AST 17 38  ALT 15 47*  ALKPHOS 116 94  BILITOT 0.9 1.0  PROT 6.7 5.4*  ALBUMIN 3.3* 2.6*   No results for input(s): LIPASE, AMYLASE in the last 168 hours. No results for input(s): AMMONIA in the last 168 hours. Coagulation Profile: No results for input(s): INR, PROTIME in the last 168 hours. Cardiac Enzymes: No results for input(s): CKTOTAL, CKMB, CKMBINDEX, TROPONINI in the last 168 hours. BNP (last 3 results) No results for input(s): PROBNP in the last 8760 hours. HbA1C: Recent Labs    12/26/18 0757  HGBA1C 7.6*   CBG: Recent Labs  Lab 12/27/18 1225 12/27/18 1708 12/27/18 2231 12/28/18 0825 12/28/18 1119  GLUCAP 139* 144* 120* 118* 110*   Lipid Profile: Recent Labs    12/28/18 0319  CHOL 80  HDL 28*  LDLCALC 40  TRIG 59  CHOLHDL 2.9   Thyroid Function Tests: Recent Labs    12/26/18 0757  TSH 1.867   Anemia Panel: No results for input(s): VITAMINB12, FOLATE, FERRITIN, TIBC, IRON, RETICCTPCT in the last  72 hours. Sepsis Labs: Recent Labs  Lab 12/25/18 1937  LATICACIDVEN 1.48    Recent Results (from the past 240 hour(s))  Blood Culture (routine x 2)     Status: None (Preliminary result)   Collection Time: 12/25/18  7:16 PM  Result Value Ref Range Status   Specimen Description BLOOD LEFT ANTECUBITAL  Final   Special Requests   Final    BOTTLES DRAWN AEROBIC AND ANAEROBIC Blood Culture results may not be optimal due to an excessive volume of blood received in culture bottles   Culture   Final    NO GROWTH 3 DAYS Performed at [facility] [address], [place], [place] [address]    Report Status PENDING  Incomplete  Blood Culture (routine x 2)     Status: None (Preliminary result)   Collection Time:  12/25/18  7:42 PM  Result Value Ref Range Status   Specimen Description BLOOD LEFT HAND  Final   Special Requests   Final    BOTTLES DRAWN AEROBIC AND ANAEROBIC Blood Culture results may not be optimal due to an inadequate volume of blood received in culture bottles   Culture   Final    NO GROWTH 3 DAYS Performed at [facility], [place] [address], [place], [place] [address]    Report Status PENDING  Incomplete         Radiology Studies: No results found.      Scheduled Meds: . apixaban  5 mg Oral BID  . calcium-vitamin D  1 tablet Oral QHS  . carvedilol  6.25 mg Oral BID WC  . docusate sodium  100 mg Oral BID  . FLUoxetine  20 mg Oral Daily  . furosemide  80 mg Intravenous BID  . hydrocerin   Topical BID  . insulin aspart  0-20 Units Subcutaneous TID WC  . rOPINIRole  4 mg Oral QHS  . rosuvastatin  20 mg Oral q1800  . sacubitril-valsartan  1 tablet Oral BID   Continuous Infusions:    LOS: 1 day   Time spent= 25 mins     Arsenio Loader, MD [facility]  If 7PM-7AM, please contact night-coverage [email or web address] 12/28/2018, 12:15 PM

## 2018-12-28 NOTE — Care Management (Signed)
Per Saralyn Pilar. [place]. Co-pay amount for Eliquis 5 mg. twice a day $47.00. No PA required. Pharmacy : [facility] Ref# [id number]

## 2018-12-29 DIAGNOSIS — D649 Anemia, unspecified: Secondary | ICD-10-CM

## 2018-12-29 LAB — COMPREHENSIVE METABOLIC PANEL
ALBUMIN: 2.9 g/dL — AB (ref 3.5–5.0)
ALT: 56 U/L — ABNORMAL HIGH (ref 0–44)
AST: 38 U/L (ref 15–41)
Alkaline Phosphatase: 99 U/L (ref 38–126)
Anion gap: 10 (ref 5–15)
BUN: 23 mg/dL (ref 8–23)
CO2: 24 mmol/L (ref 22–32)
Calcium: 8 mg/dL — ABNORMAL LOW (ref 8.9–10.3)
Chloride: 97 mmol/L — ABNORMAL LOW (ref 98–111)
Creatinine, Ser: 0.84 mg/dL (ref 0.61–1.24)
GFR calc Af Amer: >60 mL/min (ref >60)
GFR calc non Af Amer: >60 mL/min (ref >60)
Glucose, Bld: 141 mg/dL — ABNORMAL HIGH (ref 70–99)
Potassium: 4.1 mmol/L (ref 3.5–5.1)
Sodium: 131 mmol/L — ABNORMAL LOW (ref 135–145)
Total Bilirubin: 0.8 mg/dL (ref 0.3–1.2)
Total Protein: 5.6 g/dL — ABNORMAL LOW (ref 6.5–8.1)

## 2018-12-29 LAB — CBC
HCT: 33.8 % — ABNORMAL LOW (ref 39.0–52.0)
Hemoglobin: 10.7 g/dL — ABNORMAL LOW (ref 13.0–17.0)
MCH: 25.7 pg — ABNORMAL LOW (ref 26.0–34.0)
MCHC: 31.7 g/dL (ref 30.0–36.0)
MCV: 81.3 fL (ref 80.0–100.0)
NRBC: 0 % (ref 0.0–0.2)
Platelets: 230 10*3/uL (ref 150–400)
RBC: 4.16 MIL/uL — ABNORMAL LOW (ref 4.22–5.81)
RDW: 15.4 % (ref 11.5–15.5)
WBC: 6.7 10*3/uL (ref 4.0–10.5)

## 2018-12-29 LAB — MAGNESIUM: Magnesium: 2.1 mg/dL (ref 1.7–2.4)

## 2018-12-29 LAB — GLUCOSE, CAPILLARY
GLUCOSE-CAPILLARY: 121 mg/dL — AB (ref 70–99)
Glucose-Capillary: 130 mg/dL — ABNORMAL HIGH (ref 70–99)
Glucose-Capillary: 188 mg/dL — ABNORMAL HIGH (ref 70–99)
Glucose-Capillary: 115 mg/dL — ABNORMAL HIGH (ref 70–99)

## 2018-12-29 MED ORDER — BOOST PO LIQD
237.0000 mL | Freq: Two times a day (BID) | ORAL | Status: DC
  Administered 2018-12-29: 237 mL via ORAL
  Filled 2018-12-29 (×2): qty 237

## 2018-12-29 MED ORDER — ALUM & MAG HYDROXIDE-SIMETH 200-200-20 MG/5ML PO SUSP
30.0000 mL | ORAL | Status: DC | PRN
  Administered 2018-12-29 – 2019-01-02 (×2): 30 mL via ORAL
  Filled 2018-12-29 (×2): qty 30

## 2018-12-29 NOTE — Care Management Important Message (Signed)
Important Message  Patient Details  Name: Jeffery Nelson MRN: [id number] Date of Birth: April 01, 1946   Medicare Important Message Given:  Yes    Barb Merino Enez Monahan 12/29/2018, 11:54 AM

## 2018-12-29 NOTE — Progress Notes (Addendum)
Progress Note  Patient Name: Jeffery Nelson Date of Encounter: 12/29/2018  Primary Cardiologist:  Pixie Casino, MD  Subjective   Breathing a little better, not back to baseline. No presyncope or palpitations.  Inpatient Medications    Scheduled Meds: . apixaban  5 mg Oral BID  . calcium-vitamin D  1 tablet Oral QHS  . carvedilol  6.25 mg Oral BID WC  . docusate sodium  100 mg Oral BID  . FLUoxetine  20 mg Oral Daily  . furosemide  80 mg Intravenous BID  . hydrocerin   Topical BID  . insulin aspart  0-20 Units Subcutaneous TID WC  . rOPINIRole  4 mg Oral QHS  . rosuvastatin  20 mg Oral q1800  . sacubitril-valsartan  1 tablet Oral BID   Continuous Infusions:  PRN Meds: acetaminophen, alum & mag hydroxide-simeth, HYDROcodone-acetaminophen, methocarbamol   Vital Signs    Vitals:   12/29/18 0020 12/29/18 0500 12/29/18 0700 12/29/18 0748  BP: (!) 89/60  (!) 94/55 112/76  Pulse: 67  82 89  Resp: 17  19 (!) 21  Temp: 98.4 F (36.9 C)  98.4 F (36.9 C) 98.4 F (36.9 C)  TempSrc: Oral  Oral Oral  SpO2: 96%  100% 95%  Weight:  135.9 kg    Height:        Intake/Output Summary (Last 24 hours) at 12/29/2018 0842 Last data filed at 12/29/2018 0700 Gross per 24 hour  Intake 443.61 ml  Output 2500 ml  Net -2056.39 ml   Filed Weights   12/27/18 0335 12/28/18 0624 12/29/18 0500  Weight: (!) 140.6 kg (!) 139.9 kg 135.9 kg    Telemetry    Atrial fib, rate generally ok, VT episodes, up to 8.9 sec - Personally Reviewed  ECG    None today - Personally Reviewed  Physical Exam   General: Well developed, morbidly obese, male in no acute distress Head: Eyes PERRLA, No xanthomas.   Normocephalic and atraumatic Lungs: Clear bilaterally to auscultation. Heart: Irreg R&R S1 S2, without RG. +SEM,  Pulses are 2+ & equal. No JVD seen but difficult to assess 2nd body habitus Abdomen: Bowel sounds are present, abdomen soft and non-tender without masses or  hernias  noted. Msk: Normal strength and tone for age. Extremities: No clubbing, cyanosis, 1+ edema.    Skin:  No rashes or lesions noted. Chronic stasis changes, no open wounds Neuro: Alert and oriented X 3. Psych:  Good affect, responds appropriately  Labs    Hematology Recent Labs  Lab 12/27/18 0525 12/28/18 0319 12/29/18 0513  WBC 7.9 8.3 6.7  RBC 4.26 3.75* 4.16*  HGB 11.3* 10.0* 10.7*  HCT 35.2* 30.7* 33.8*  MCV 82.6 81.9 81.3  MCH 26.5 26.7 25.7*  MCHC 32.1 32.6 31.7  RDW 15.3 15.1 15.4  PLT 223 228 230    Chemistry Recent Labs  Lab 12/25/18 1927  12/28/18 0319 12/28/18 2042 12/29/18 0513  NA 128*   < > 130* 131* 131*  K 4.7   < > 4.1 4.3 4.1  CL 96*   < > 99 99 97*  CO2 23   < > 23 22 24   GLUCOSE 167*   < > 117* 155* 141*  BUN 30*   < > 27* 26* 23  CREATININE 0.80   < > 0.89 0.87 0.84  CALCIUM 8.4*   < > 7.9* 8.0* 8.0*  PROT 6.7  --  5.4*  --  5.6*  ALBUMIN 3.3*  --  2.6*  --  2.9*  AST 17  --  38  --  38  ALT 15  --  47*  --  56*  ALKPHOS 116  --  94  --  99  BILITOT 0.9  --  1.0  --  0.8  GFRNONAA >60   < > >60 >60 >60  GFRAA >60   < > >60 >60 >60  ANIONGAP 9   < > 8 10 10    < > = values in this interval not displayed.     Cardiac Enzymes  Recent Labs  Lab 12/25/18 1538  TROPIPOC 0.02     BNP Recent Labs  Lab 12/25/18 1927 12/28/18 0319  BNP 1,266.8* 1,379.5*     Radiology    Dg Chest 2 View  Result Date: 12/25/2018 CLINICAL DATA:  Shortness of breath and fluid retention. EXAM: CHEST - 2 VIEW COMPARISON:  Chest x-ray dated June 27, 2018. FINDINGS: Unchanged left chest wall pacemaker. Stable cardiomegaly. Normal pulmonary vascularity. Low lung volumes. No focal consolidation, pleural effusion, or pneumothorax. No acute osseous abnormality. IMPRESSION: 1. Stable cardiomegaly.  No active cardiopulmonary disease. Electronically Signed   By: Titus Dubin M.D.   On: 12/25/2018 16:05   Ct Angio Chest Pe W/cm &/or Wo Cm  Result Date:  12/25/2018 CLINICAL DATA:  Bilateral leg swelling with shortness of breath EXAM: CT ANGIOGRAPHY CHEST WITH CONTRAST TECHNIQUE: Multidetector CT imaging of the chest was performed using the standard protocol during bolus administration of intravenous contrast. Multiplanar CT image reconstructions and MIPs were obtained to evaluate the vascular anatomy. CONTRAST:  94 mL ISOVUE-370 IOPAMIDOL (ISOVUE-370) INJECTION 76% COMPARISON:  Chest radiograph 12/25/2018, CT chest 08/01/2010 FINDINGS: Cardiovascular: Motion degradation limits evaluation for acute distal PE. No acute filling defects within the central or proximal segmental vessels to suggest acute embolus. Mild cardiomegaly. No significant pericardial effusion. Nonaneurysmal aorta. Coronary vascular calcification. Mediastinum/Nodes: Midline trachea. No thyroid mass. No significant adenopathy. Small hiatal hernia Lungs/Pleura: Lungs are clear. No pleural effusion or pneumothorax. Upper Abdomen: No acute abnormality. Musculoskeletal: Degenerative changes. No acute or suspicious abnormality Review of the MIP images confirms the above findings. IMPRESSION: 1. Limited evaluation for distal emboli due to motion degradation. No acute central PE is seen. 2. Mild cardiomegaly 3. Clear lung fields Electronically Signed   By: Donavan Foil M.D.   On: 12/25/2018 22:53     Cardiac Studies   ECHO:  12/26/2018 - Left ventricle: The cavity size was mildly dilated. Wall   thickness was increased in a pattern of mild LVH. Systolic   function was severely reduced. The estimated ejection fraction   was in the range of 20% to 25%. Wall motion was normal; there   were no regional wall motion abnormalities. The study was not   technically sufficient to allow evaluation of LV diastolic   dysfunction due to atrial fibrillation. - Aortic valve: Trileaflet; mildly thickened, mildly calcified   leaflets. - Mitral valve: Calcified annulus. There was mild regurgitation. - Left  atrium: The atrium was severely dilated. - Right atrium: The atrium was moderately dilated. - Tricuspid valve: There was mild regurgitation. - Pulmonary arteries: PA peak pressure: 31 mm Hg (S).   Patient Profile     73 y.o. male w/ hx NICM, chronic combined systolic and diastolic HF, nonobstructive CAD by cath in 2017, h/o syncope presumed to be VT s/p AICD ([employer]) followed by Dr. Lovena Le, HTN, HLD, T2DM, S-D-CHF, and presumed OSA, broken R humerus after a fall  12/12/2018 was admitted 12/26/2018 w/ acute on chronic S-D-CHF and Afib (new dx), CHA2DS2-VASc = 5 (CHF, HTN, DM, age x 1, vasc dz). Cards saw 01/08  Assessment & Plan     Principal Problem: 1.   Atrial fibrillation with rapid ventricular response ([place]) - initial HR 150s>>IV Cardizem>>HR improved>>d/c'd.  - baseline HR control on Coreg, has likely been in Afib since 12/24 or before (Afib documented on ER visit0 - continue Eliquis 5 mg bid - w/ LA dilatation, continue rate control  2. Acute on chronic combined CHF - reinforce Na and med compliance - I/O net -2L overnight, net -1.3 L since admit - wt down 10 lbs from peak of 309 - BNP was checked 01/09 and had gone up, continue diuresis - continue Lasix 80 mg IV BID for now, daily BMET  3. Possible syncope 12/24 leading to fall and humerus fx>>device interrogation - no episodes during that time, no high or low HR episodes noted - some VT overnight, one episode of 8.9 sec, no sx. - VT rate was approx 150, below the device detection rate of 200 so did not do ATP or detect it.   4. Prob OSA - needs sleep study as outpt  5. NICM - continue Entresto and Coreg  Otherwise, per IM Active Problems:   RLS (restless legs syndrome)   Type 2 diabetes mellitus (HCC)   DOE (dyspnea on exertion)   Cellulitis   Hyponatremia   Chronic anemia   Humerus fracture   Chronic pain   HLD (hyperlipidemia)   Atrial fibrillation with RVR (HCC)    Jonetta Speak , PA-C 8:42  AM 12/29/2018 Pager: [phone]

## 2018-12-29 NOTE — Plan of Care (Signed)
  Problem: Clinical Measurements: Goal: Will remain free from infection Outcome: Progressing Note:  No s/s of infection noted.   Problem: Elimination: Goal: Will not experience complications related to urinary retention Outcome: Progressing Note:  No s/s of urinary retention.  External catheter in place.  Diuresing well.

## 2018-12-29 NOTE — Progress Notes (Signed)
PROGRESS NOTE    Jeffery Nelson  PIR:[id number] DOB: 1946-08-02 DOA: 12/25/2018 PCP: [name], MD   Brief Narrative:  73 year old with history of chronic combined systolic and diastolic congestive heart failure status post AICD, diabetes mellitus type 2, essential hypertension came to the hospital with complaints of recent weight gain and progressive shortness of breath especially with laying flat.  He reports he has has chronic bilateral lower extremity with erythema for a long time.  Denied any fevers and chills.  He was found to be in atrial fibrillation which appears to be new diagnosis for him.   Assessment & Plan:   Principal Problem:   Atrial fibrillation with rapid ventricular response (HCC) Active Problems:   RLS (restless legs syndrome)   Type 2 diabetes mellitus (HCC)   DOE (dyspnea on exertion)   Cellulitis   Hyponatremia   Chronic anemia   Humerus fracture   Chronic pain   HLD (hyperlipidemia)   Atrial fibrillation with RVR (HCC)  Acute respiratory distress secondary to CHF exacerbation Acute combined systolic and diastolic congestive heart failure, class III, ejection fraction 20% status post AICD -Rate controlled on Coreg. -Should likely be able to continue Lasix 80 mg twice daily today as patient's blood pressure and renal function remained stable.  Maintain fluid restriction -Resume Entresto  Bilateral lower extremity chronic swelling and erythema -Erythematous but I do not think they are infected at this time.  Will discontinue antibiotics and clinically monitor him.  Persistent atrial fibrillation, RVR is resolved. -Off Cardizem drip, rate is better controlled. -Continue Coreg 6.25 mg twice daily. -Eliquis 5 mg twice daily - Cardiology following -Echocardiogram yesterday showed ejection fraction 20-25%, mild LVH, mild MR, severely dilated left atrium, moderately dilated right atrium, mild MR.  Echo in June 2019 had similar findings  Physical  deconditioning secondary to morbid obesity and multiple medical comorbidities -PT/OT consulted-SNF  Anemia of chronic disease -Around baseline of 11.  No obvious signs of bleeding.  Diabetes mellitus type 2 -Hemoglobin A1c 7.6.  Proximal right humerus fracture -Previously seen by orthopedic outpatient on December 12, 2018-sling in place.  Needs to follow-up outpatient with orthopedic  Restless leg syndrome -Continue ropinirole  Hyperlipidemia -Continue Crestor  DVT prophylaxis: Eliquis Code Status: Full Code  Family Communication:  None at bedside Disposition Plan: Maintain hospital stay for more IV diuresis and until cleared by cardiology.  Consultants:   Cardiology  Procedures:   None  Antimicrobials:   Vancomycin and Rocephin stopped 1/8   Subjective: Symptomatically patient states he feels a little better and is able to lay flat.  Still has bilateral lower extremity swelling.  Review of Systems Otherwise negative except as per HPI, including: General = no fevers, chills, dizziness, malaise, fatigue HEENT/EYES = negative for pain, redness, loss of vision, double vision, blurred vision, loss of hearing, sore throat, hoarseness, dysphagia Cardiovascular= negative for chest pain, palpitation, murmurs, lower extremity swelling Respiratory/lungs= negative for shortness of breath, cough, hemoptysis, wheezing, mucus production Gastrointestinal= negative for nausea, vomiting,, abdominal pain, melena, hematemesis Genitourinary= negative for Dysuria, Hematuria, Change in Urinary Frequency MSK = Negative for arthralgia, myalgias, Back Pain, Joint swelling  Neurology= Negative for headache, seizures, numbness, tingling  Psychiatry= Negative for anxiety, depression, suicidal and homocidal ideation Allergy/Immunology= Medication/Food allergy as listed  Skin= Negative for Rash, lesions, ulcers, itching    Objective: Vitals:   12/29/18 0020 12/29/18 0500 12/29/18 0700  12/29/18 0748  BP: (!) 89/60  (!) 94/55 112/76  Pulse: 67  82  89  Resp: 17  19 (!) 21  Temp: 98.4 F (36.9 C)  98.4 F (36.9 C) 98.4 F (36.9 C)  TempSrc: Oral  Oral Oral  SpO2: 96%  100% 95%  Weight:  135.9 kg    Height:        Intake/Output Summary (Last 24 hours) at 12/29/2018 1219 Last data filed at 12/29/2018 1110 Gross per 24 hour  Intake 663.61 ml  Output 1975 ml  Net -1311.39 ml   Filed Weights   12/27/18 0335 12/28/18 0624 12/29/18 0500  Weight: (!) 140.6 kg (!) 139.9 kg 135.9 kg    Examination: Constitutional: NAD, calm, comfortable Eyes: PERRL, lids and conjunctivae normal ENMT: Mucous membranes are moist. Posterior pharynx clear of any exudate or lesions.Normal dentition.  Neck: normal, supple, no masses, no thyromegaly Respiratory: clear to auscultation bilaterally, no wheezing, no crackles. Normal respiratory effort. No accessory muscle use.  Cardiovascular: Regular rate and rhythm, no murmurs / rubs / gallops.  2+ bilateral lower extremity pitting edema 2+ pedal pulses. No carotid bruits.  Abdomen: no tenderness, no masses palpated. No hepatosplenomegaly. Bowel sounds positive.  Musculoskeletal: no clubbing / cyanosis. No joint deformity upper and lower extremities. Good ROM, no contractures. Normal muscle tone.  Skin: Tonic bilateral lower extremity skin changes with slight erythema but no warmth noted.  This is chronic. Neurologic: CN 2-12 grossly intact. Sensation intact, DTR normal. Strength 5/5 in all 4.  Psychiatric: Normal judgment and insight. Alert and oriented x 3. Normal mood.     Data Reviewed:   CBC: Recent Labs  Lab 12/25/18 1511 12/25/18 1927 12/27/18 0525 12/28/18 0319 12/29/18 0513  WBC 6.6 8.1 7.9 8.3 6.7  NEUTROABS  --  6.4  --   --   --   HGB 10.7* 11.2* 11.3* 10.0* 10.7*  HCT 34.6* 36.0* 35.2* 30.7* 33.8*  MCV 82.4 82.6 82.6 81.9 81.3  PLT 213 239 223 228 876   Basic Metabolic Panel: Recent Labs  Lab 12/25/18 1927  12/26/18 0757 12/28/18 0319 12/28/18 2042 12/29/18 0513  NA 128* 128* 130* 131* 131*  K 4.7 5.0 4.1 4.3 4.1  CL 96* 96* 99 99 97*  CO2 23 19* 23 22 24   GLUCOSE 167* 200* 117* 155* 141*  BUN 30* 30* 27* 26* 23  CREATININE 0.80 0.91 0.89 0.87 0.84  CALCIUM 8.4* 8.0* 7.9* 8.0* 8.0*  MG  --   --  1.8 1.7 2.1   GFR: Estimated Creatinine Clearance: 101.1 mL/min (by C-G formula based on SCr of 0.84 mg/dL). Liver Function Tests: Recent Labs  Lab 12/25/18 1927 12/28/18 0319 12/29/18 0513  AST 17 38 38  ALT 15 47* 56*  ALKPHOS 116 94 99  BILITOT 0.9 1.0 0.8  PROT 6.7 5.4* 5.6*  ALBUMIN 3.3* 2.6* 2.9*   No results for input(s): LIPASE, AMYLASE in the last 168 hours. No results for input(s): AMMONIA in the last 168 hours. Coagulation Profile: No results for input(s): INR, PROTIME in the last 168 hours. Cardiac Enzymes: No results for input(s): CKTOTAL, CKMB, CKMBINDEX, TROPONINI in the last 168 hours. BNP (last 3 results) No results for input(s): PROBNP in the last 8760 hours. HbA1C: No results for input(s): HGBA1C in the last 72 hours. CBG: Recent Labs  Lab 12/28/18 1119 12/28/18 1729 12/28/18 2236 12/29/18 0735 12/29/18 1209  GLUCAP 110* 115* 159* 130* 188*   Lipid Profile: Recent Labs    12/28/18 0319  CHOL 80  HDL 28*  LDLCALC 40  TRIG 59  CHOLHDL 2.9   Thyroid Function Tests: No results for input(s): TSH, T4TOTAL, FREET4, T3FREE, THYROIDAB in the last 72 hours. Anemia Panel: No results for input(s): VITAMINB12, FOLATE, FERRITIN, TIBC, IRON, RETICCTPCT in the last 72 hours. Sepsis Labs: Recent Labs  Lab 12/25/18 1937  LATICACIDVEN 1.48    Recent Results (from the past 240 hour(s))  Blood Culture (routine x 2)     Status: None (Preliminary result)   Collection Time: 12/25/18  7:16 PM  Result Value Ref Range Status   Specimen Description BLOOD LEFT ANTECUBITAL  Final   Special Requests   Final    BOTTLES DRAWN AEROBIC AND ANAEROBIC Blood Culture  results may not be optimal due to an excessive volume of blood received in culture bottles   Culture   Final    NO GROWTH 4 DAYS Performed at [facility] [address] [place], [place] [address]    Report Status PENDING  Incomplete  Blood Culture (routine x 2)     Status: None (Preliminary result)   Collection Time: 12/25/18  7:42 PM  Result Value Ref Range Status   Specimen Description BLOOD LEFT HAND  Final   Special Requests   Final    BOTTLES DRAWN AEROBIC AND ANAEROBIC Blood Culture results may not be optimal due to an inadequate volume of blood received in culture bottles   Culture   Final    NO GROWTH 4 DAYS Performed at [facility], [place] [address] [place], [place] [address]    Report Status PENDING  Incomplete         Radiology Studies: No results found.      Scheduled Meds: . apixaban  5 mg Oral BID  . calcium-vitamin D  1 tablet Oral QHS  . carvedilol  6.25 mg Oral BID WC  . docusate sodium  100 mg Oral BID  . FLUoxetine  20 mg Oral Daily  . furosemide  80 mg Intravenous BID  . hydrocerin   Topical BID  . insulin aspart  0-20 Units Subcutaneous TID WC  . lactose free nutrition  237 mL Oral BID BM  . rOPINIRole  4 mg Oral QHS  . rosuvastatin  20 mg Oral q1800  . sacubitril-valsartan  1 tablet Oral BID   Continuous Infusions:    LOS: 2 days   Time spent= 25 mins    Ankit Arsenio Loader, MD [facility]  If 7PM-7AM, please contact night-coverage [email or web address] 12/29/2018, 12:19 PM

## 2018-12-29 NOTE — Progress Notes (Signed)
Physical Therapy Treatment Patient Details Name: Jeffery Nelson MRN: [id number] DOB: 1946-07-02 Today's Date: 12/29/2018    History of Present Illness Patient is a 73 y/o male who was sent from his cardiologist due to SOB and weight gain. Found to have new onset A-fib and RLE cellulitis. Recent RUE fx on 12/24 conservative management in sling. PMH includes hip replacement 2015, DM, HTN, CHF, defibrillator, syncope, venous stasis BLEss, presents with SOB and    PT Comments    Pt slowly progressing with mobility. Able to initiate gait training by performing multiple bouts of pre-gait marching while standing at EOB. Pt only able to march 10-20 seconds before needing to rest secondary to fatigue and DOE. SpO2 >95% on RA. Reliant on LUE support to maintain balance. Pt remains limited by generalized weakness, decreased activity tolerance, and RUE NWB status. Will plan to progress ambulation distance with chair follow next session.   Follow Up Recommendations  SNF;Supervision for mobility/OOB     Equipment Recommendations  (TBD)    Recommendations for Other Services       Precautions / Restrictions Precautions Precautions: Fall Precaution Comments: watch HR, A-fib Required Braces or Orthoses: Sling Restrictions Weight Bearing Restrictions: Yes RUE Weight Bearing: Non weight bearing Other Position/Activity Restrictions: per pt from orthopedic MD    Mobility  Bed Mobility Overal bed mobility: Needs Assistance Bed Mobility: Supine to Sit;Sit to Supine     Supine to sit: Max assist;HOB elevated Sit to supine: Mod assist   General bed mobility comments: MaxA to assist trunk elevation with LUE support and L hip to EOB (attempted getting out on R-side of bed this session, pt reports L-side seemed easier with nursing); modA to assist BLEs back into supine  Transfers Overall transfer level: Needs assistance Equipment used: Rolling walker (2 wheeled) Transfers: Sit to/from Stand Sit  to Stand: Min assist;From elevated surface         General transfer comment: Able to stand on second attempt with minA, reliant on momentum and LUE support   Ambulation/Gait Ambulation/Gait assistance: Min assist;Min guard   Assistive device: Rolling walker (2 wheeled) Gait Pattern/deviations: Step-to pattern;Wide base of support     General Gait Details: Able to take side steps towards [facility] with single UE support on RW and min. Performed multiple bouts of pre-gait activity standing EOB, able to tolerate 10-20 seconds of marching in place before needing standing rest break, performed 4x sets. Increased lean onto LUE with fatigue   Stairs             Wheelchair Mobility    Modified Rankin (Stroke Patients Only)       Balance Overall balance assessment: Needs assistance;History of Falls Sitting-balance support: Feet supported;Single extremity supported Sitting balance-Leahy Scale: Fair Sitting balance - Comments: Requires UE support initially progressing to no UE support and supervision   Standing balance support: Single extremity supported Standing balance-Leahy Scale: Poor Standing balance comment: Reliant on LUE support for standing balance                            Cognition Arousal/Alertness: Awake/alert Behavior During Therapy: WFL for tasks assessed/performed Overall Cognitive Status: Within Functional Limits for tasks assessed                                 General Comments: WFL for simple tasks. Tangential with speech/some decreased attention, but able  to be redirected; likely baseline cognition      Exercises      General Comments General comments (skin integrity, edema, etc.): SpO2 98% on RA      Pertinent Vitals/Pain Pain Assessment: No/denies pain    Home Living                      Prior Function            PT Goals (current goals can now be found in the care plan section) Acute Rehab PT Goals Patient  Stated Goal: to go to rehab to get stronger PT Goal Formulation: With patient Time For Goal Achievement: 01/09/19 Potential to Achieve Goals: Good Progress towards PT goals: Progressing toward goals    Frequency    Min 2X/week      PT Plan Current plan remains appropriate    Co-evaluation              AM-PAC PT "6 Clicks" Mobility   Outcome Measure  Help needed turning from your back to your side while in a flat bed without using bedrails?: A Lot Help needed moving from lying on your back to sitting on the side of a flat bed without using bedrails?: A Lot Help needed moving to and from a bed to a chair (including a wheelchair)?: A Little Help needed standing up from a chair using your arms (e.g., wheelchair or bedside chair)?: A Little Help needed to walk in hospital room?: A Little Help needed climbing 3-5 steps with a railing? : A Lot 6 Click Score: 15    End of Session   Activity Tolerance: Patient tolerated treatment well;Patient limited by fatigue Patient left: in bed;with call bell/phone within reach;with bed alarm set Nurse Communication: Mobility status PT Visit Diagnosis: Muscle weakness (generalized) (M62.81);Unsteadiness on feet (R26.81);Difficulty in walking, not elsewhere classified (R26.2)     Time: 7829-5621 PT Time Calculation (min) (ACUTE ONLY): 33 min  Charges:  $Gait Training: 8-22 mins $Therapeutic Exercise: 8-22 mins                    Mabeline Caras, PT, DPT Acute Rehabilitation Services  Pager [phone] Office [place] 12/29/2018, 5:32 PM

## 2018-12-29 NOTE — Progress Notes (Addendum)
Nutrition Education Note  RD consulted for nutrition education regarding low sodium diet for CHF.  Patient reports good intake PTA and now. Lunch tray at bedside, 100% consumed. Weight has been trending down with diuresis.   RD provided "Low Sodium Nutrition Therapy" handout from the [employer]. Reviewed patient's dietary recall. Provided examples on ways to decrease sodium intake in diet. Discouraged intake of processed foods and use of salt shaker. Encouraged fresh fruits and vegetables as well as whole grain sources of carbohydrates to maximize fiber intake.   RD discussed why it is important for patient to adhere to diet recommendations, and emphasized the role of fluids, foods to avoid, and importance of weighing self daily. Teach back method used.  Expect good compliance.   Low albumin reflects dilutional effect with volume overload. PO intake is adequate; PO supplements are not needed at this time. Will d/c Boost.  Body mass index is 49.84 kg/m. Pt meets criteria for class 3, extreme/morbid obesity based on current BMI.  Current diet order is soft, low sodium, 1.5 L fluid restriction, patient is consuming approximately 100% of meals at this time. Labs and medications reviewed. No further nutrition interventions warranted at this time. RD contact information provided. If additional nutrition issues arise, please re-consult RD.   Molli Barrows, RD, LDN, [place] Pager [phone] After Hours Pager [phone]

## 2018-12-30 LAB — CULTURE, BLOOD (ROUTINE X 2)
Culture: NO GROWTH
Culture: NO GROWTH

## 2018-12-30 LAB — GLUCOSE, CAPILLARY
Glucose-Capillary: 154 mg/dL — ABNORMAL HIGH (ref 70–99)
Glucose-Capillary: 122 mg/dL — ABNORMAL HIGH (ref 70–99)
Glucose-Capillary: 131 mg/dL — ABNORMAL HIGH (ref 70–99)
Glucose-Capillary: 150 mg/dL — ABNORMAL HIGH (ref 70–99)

## 2018-12-30 LAB — MAGNESIUM: Magnesium: 1.8 mg/dL (ref 1.7–2.4)

## 2018-12-30 LAB — COMPREHENSIVE METABOLIC PANEL
ALT: 48 U/L — ABNORMAL HIGH (ref 0–44)
AST: 30 U/L (ref 15–41)
Albumin: 2.8 g/dL — ABNORMAL LOW (ref 3.5–5.0)
Alkaline Phosphatase: 102 U/L (ref 38–126)
Anion gap: 11 (ref 5–15)
BUN: 21 mg/dL (ref 8–23)
CHLORIDE: 98 mmol/L (ref 98–111)
CO2: 24 mmol/L (ref 22–32)
Calcium: 8.2 mg/dL — ABNORMAL LOW (ref 8.9–10.3)
Creatinine, Ser: 0.78 mg/dL (ref 0.61–1.24)
GFR calc Af Amer: >60 mL/min (ref >60)
GFR calc non Af Amer: >60 mL/min (ref >60)
Glucose, Bld: 131 mg/dL — ABNORMAL HIGH (ref 70–99)
Potassium: 3.8 mmol/L (ref 3.5–5.1)
SODIUM: 133 mmol/L — AB (ref 135–145)
Total Bilirubin: 0.8 mg/dL (ref 0.3–1.2)
Total Protein: 5.6 g/dL — ABNORMAL LOW (ref 6.5–8.1)

## 2018-12-30 LAB — CBC
HCT: 33.8 % — ABNORMAL LOW (ref 39.0–52.0)
Hemoglobin: 10.7 g/dL — ABNORMAL LOW (ref 13.0–17.0)
MCH: 26 pg (ref 26.0–34.0)
MCHC: 31.7 g/dL (ref 30.0–36.0)
MCV: 82 fL (ref 80.0–100.0)
Platelets: 234 10*3/uL (ref 150–400)
RBC: 4.12 MIL/uL — ABNORMAL LOW (ref 4.22–5.81)
RDW: 15.3 % (ref 11.5–15.5)
WBC: 7.4 10*3/uL (ref 4.0–10.5)
nRBC: 0 % (ref 0.0–0.2)

## 2018-12-30 NOTE — Progress Notes (Signed)
Progress Note  Patient Name: Jeffery Nelson Date of Encounter: 12/30/2018  Primary Cardiologist:   Pixie Casino, MD   Subjective   Denies chest pain or SOB.   Inpatient Medications    Scheduled Meds: . apixaban  5 mg Oral BID  . calcium-vitamin D  1 tablet Oral QHS  . carvedilol  6.25 mg Oral BID WC  . docusate sodium  100 mg Oral BID  . FLUoxetine  20 mg Oral Daily  . furosemide  80 mg Intravenous BID  . hydrocerin   Topical BID  . insulin aspart  0-20 Units Subcutaneous TID WC  . rOPINIRole  4 mg Oral QHS  . rosuvastatin  20 mg Oral q1800  . sacubitril-valsartan  1 tablet Oral BID   Continuous Infusions:  PRN Meds: acetaminophen, alum & mag hydroxide-simeth, HYDROcodone-acetaminophen, methocarbamol   Vital Signs    Vitals:   12/29/18 2239 12/30/18 0559 12/30/18 0753 12/30/18 0900  BP: (!) 114/98 106/78 97/63 (!) 161/62  Pulse: 90 68 81 77  Resp: 17 15 14 19   Temp: 98.2 F (36.8 C) 98.3 F (36.8 C) 97.7 F (36.5 C)   TempSrc: Oral Oral Oral   SpO2: 97% 94% 99% 100%  Weight:  (!) 136.9 kg    Height:        Intake/Output Summary (Last 24 hours) at 12/30/2018 1206 Last data filed at 12/30/2018 0409 Gross per 24 hour  Intake 787 ml  Output 1650 ml  Net -863 ml   Filed Weights   12/28/18 0624 12/29/18 0500 12/30/18 0559  Weight: (!) 139.9 kg 135.9 kg (!) 136.9 kg    Telemetry    Atrial fib with rare ventricular ectopy - Personally Reviewed  ECG    No  - Personally Reviewed  Physical Exam   GEN: No acute distress.   Neck:   Difficult to assess JVD Cardiac: Irregular RR, no murmurs, rubs, or gallops.  Respiratory: Clear  to auscultation bilaterally. GI: Soft, nontender, non-distended  MS:    Moderately severe leg edema; No deformity. Neuro:  Nonfocal  Psych: Normal affect   Labs    Chemistry Recent Labs  Lab 12/28/18 0319 12/28/18 2042 12/29/18 0513 12/30/18 0329  NA 130* 131* 131* 133*  K 4.1 4.3 4.1 3.8  CL 99 99 97* 98  CO2  23 22 24 24   GLUCOSE 117* 155* 141* 131*  BUN 27* 26* 23 21  CREATININE 0.89 0.87 0.84 0.78  CALCIUM 7.9* 8.0* 8.0* 8.2*  PROT 5.4*  --  5.6* 5.6*  ALBUMIN 2.6*  --  2.9* 2.8*  AST 38  --  38 30  ALT 47*  --  56* 48*  ALKPHOS 94  --  99 102  BILITOT 1.0  --  0.8 0.8  GFRNONAA >60 >60 >60 >60  GFRAA >60 >60 >60 >60  ANIONGAP 8 10 10 11      Hematology Recent Labs  Lab 12/28/18 0319 12/29/18 0513 12/30/18 0329  WBC 8.3 6.7 7.4  RBC 3.75* 4.16* 4.12*  HGB 10.0* 10.7* 10.7*  HCT 30.7* 33.8* 33.8*  MCV 81.9 81.3 82.0  MCH 26.7 25.7* 26.0  MCHC 32.6 31.7 31.7  RDW 15.1 15.4 15.3  PLT 228 230 234    Cardiac EnzymesNo results for input(s): TROPONINI in the last 168 hours.  Recent Labs  Lab 12/25/18 1538  TROPIPOC 0.02     BNP Recent Labs  Lab 12/25/18 1927 12/28/18 0319  BNP 1,266.8* 1,379.5*  DDimer No results for input(s): DDIMER in the last 168 hours.   Radiology    No results found.  Cardiac Studies   ECHO:  12/26/2018 - Left ventricle: The cavity size was mildly dilated. Wall thickness was increased in a pattern of mild LVH. Systolic function was severely reduced. The estimated ejection fraction was in the range of 20% to 25%. Wall motion was normal; there were no regional wall motion abnormalities. The study was not technically sufficient to allow evaluation of LV diastolic dysfunction due to atrial fibrillation. - Aortic valve: Trileaflet; mildly thickened, mildly calcified leaflets. - Mitral valve: Calcified annulus. There was mild regurgitation. - Left atrium: The atrium was severely dilated. - Right atrium: The atrium was moderately dilated. - Tricuspid valve: There was mild regurgitation. - Pulmonary arteries: PA peak pressure: 31 mm Hg (S).  Patient Profile     73 y.o. male w/ hx NICM, chronic combined systolic and diastolic HF, nonobstructive CAD by cath in 2017, h/o syncope presumed to be VT s/p AICD ([employer]) followed  by Dr. Lovena Le, HTN, HLD, T2DM, S-D-CHF, and presumed OSA, broken R humerus after a fall 12/12/2018 was admitted 12/26/2018 w/ acute on chronic S-D-CHF and Afib (new dx), CHA2DS2-VASc = 5 (CHF, HTN, DM, age x 1, vasc dz). Cards saw 01/08  Assessment & Plan    ATRIAL FIB:   Rate OK.  No change in therapy.   ACUTE ON CHRONIC DIASTOLIC AND SYSTOLIC HF:   Difficult to assess with his body habitus.     1800 cc negative last night but some urinary incontinence.  Continue current IV diuresis.     PROBABLE OSA:  Needs evaluation as an outpatient.   VT:  No sustained arrhythmia overnight.     For questions or updates, please contact [place] Please consult [email or web address] for contact info under Cardiology/STEMI.   Signed, Minus Breeding, MD  12/30/2018, 12:06 PM

## 2018-12-30 NOTE — Progress Notes (Signed)
PROGRESS NOTE    Jeffery Nelson  CBJ:[id number] DOB: 1946-10-28 DOA: 12/25/2018 PCP: [name], MD   Brief Narrative:  73 year old with history of chronic combined systolic and diastolic congestive heart failure status post AICD, diabetes mellitus type 2, essential hypertension came to the hospital with complaints of recent weight gain and progressive shortness of breath especially with laying flat.  He reports he has has chronic bilateral lower extremity with erythema for a long time.  Denied any fevers and chills.  He was found to be in atrial fibrillation which appears to be new diagnosis for him. Getting diuresed well with IV lasix.    Assessment & Plan:   Principal Problem:   Atrial fibrillation with rapid ventricular response (HCC) Active Problems:   RLS (restless legs syndrome)   Type 2 diabetes mellitus (HCC)   DOE (dyspnea on exertion)   Cellulitis   Hyponatremia   Chronic anemia   Humerus fracture   Chronic pain   HLD (hyperlipidemia)   Atrial fibrillation with RVR (HCC)  Acute respiratory distress secondary to CHF exacerbation Acute combined systolic and diastolic congestive heart failure, class III, ejection fraction 20% status post AICD -Rate controlled on Coreg. -Renal function remains stable, I would prefer continue Lasix 80 mg IV every 12 hours to get him as close to euvolemic status possible during hospitalization. -Resume Entresto  Bilateral lower extremity chronic swelling and erythema -Erythematous but I do not think they are infected at this time.  No need for antibiotic  Persistent atrial fibrillation, RVR is resolved. -Heart rate is well controlled on Coreg 6.25 mg twice daily -Eliquis 5 mg twice daily - Cardiology following -Echocardiogram yesterday showed ejection fraction 20-25%, mild LVH, mild MR, severely dilated left atrium, moderately dilated right atrium, mild MR.  Echo in June 2019 had similar findings  Physical deconditioning secondary  to morbid obesity and multiple medical comorbidities -PT/OT is recommending-SNF  Anemia of chronic disease -Around baseline of 11.  No obvious signs of bleeding.  Diabetes mellitus type 2 -Hemoglobin A1c 7.6.  Proximal right humerus fracture -Previously seen by orthopedic outpatient on December 12, 2018-sling in place.  Needs to follow-up outpatient with orthopedic  Restless leg syndrome -Continue ropinirole  Hyperlipidemia -Continue Crestor  DVT prophylaxis: Eliquis Code Status: Full Code  Family Communication:  None at bedside Disposition Plan: Maintain inpatient stay for more IV diuretics until he is reached euvolemic state and then will transition him to skilled nursing facility.  Consultants:   Cardiology  Procedures:   None  Antimicrobials:   Vancomycin and Rocephin stopped 1/8   Subjective: Patient continues to diurese well with IV Lasix.  His breathing has continued to improve daily.  Review of Systems Otherwise negative except as per HPI, including: General = no fevers, chills, dizziness, malaise, fatigue HEENT/EYES = negative for pain, redness, loss of vision, double vision, blurred vision, loss of hearing, sore throat, hoarseness, dysphagia Cardiovascular= negative for chest pain, palpitation, murmurs, lower extremity swelling Respiratory/lungs= negative for shortness of breath, cough, hemoptysis, wheezing, mucus production Gastrointestinal= negative for nausea, vomiting,, abdominal pain, melena, hematemesis Genitourinary= negative for Dysuria, Hematuria, Change in Urinary Frequency MSK = Negative for arthralgia, myalgias, Back Pain, Joint swelling  Neurology= Negative for headache, seizures, numbness, tingling  Psychiatry= Negative for anxiety, depression, suicidal and homocidal ideation Allergy/Immunology= Medication/Food allergy as listed  Skin= Negative for Rash, lesions, ulcers, itching    Objective: Vitals:   12/29/18 2239 12/30/18 0559  12/30/18 0753 12/30/18 0900  BP: (!) 114/98  106/78 97/63 (!) 161/62  Pulse: 90 68 81 77  Resp: 17 15 14 19   Temp: 98.2 F (36.8 C) 98.3 F (36.8 C) 97.7 F (36.5 C)   TempSrc: Oral Oral Oral   SpO2: 97% 94% 99% 100%  Weight:  (!) 136.9 kg    Height:        Intake/Output Summary (Last 24 hours) at 12/30/2018 1121 Last data filed at 12/30/2018 0409 Gross per 24 hour  Intake 787 ml  Output 1650 ml  Net -863 ml   Filed Weights   12/28/18 0624 12/29/18 0500 12/30/18 0559  Weight: (!) 139.9 kg 135.9 kg (!) 136.9 kg    Examination: Constitutional: NAD, calm, comfortable, morbid obesity Eyes: PERRL, lids and conjunctivae normal ENMT: Mucous membranes are moist. Posterior pharynx clear of any exudate or lesions.Normal dentition.  Neck: normal, supple, no masses, no thyromegaly Respiratory: Diminished breath sounds bilaterally Cardiovascular: Regular rate and rhythm, no murmurs / rubs / gallops. No extremity edema. 2+ pedal pulses. No carotid bruits.  Abdomen: no tenderness, no masses palpated. No hepatosplenomegaly. Bowel sounds positive.  Musculoskeletal: no clubbing / cyanosis. No joint deformity upper and lower extremities. Good ROM, no contractures. Normal muscle tone.  Skin: Chronic lower extremity bilateral skin changes with some erythema but no warmth noted. Neurologic: CN 2-12 grossly intact. Sensation intact, DTR normal. Strength 4/5 in all 4.  Psychiatric: Normal judgment and insight. Alert and oriented x 3. Normal mood.    Data Reviewed:   CBC: Recent Labs  Lab 12/25/18 1927 12/27/18 0525 12/28/18 0319 12/29/18 0513 12/30/18 0329  WBC 8.1 7.9 8.3 6.7 7.4  NEUTROABS 6.4  --   --   --   --   HGB 11.2* 11.3* 10.0* 10.7* 10.7*  HCT 36.0* 35.2* 30.7* 33.8* 33.8*  MCV 82.6 82.6 81.9 81.3 82.0  PLT 239 223 228 230 335   Basic Metabolic Panel: Recent Labs  Lab 12/26/18 0757 12/28/18 0319 12/28/18 2042 12/29/18 0513 12/30/18 0329  NA 128* 130* 131* 131* 133*    K 5.0 4.1 4.3 4.1 3.8  CL 96* 99 99 97* 98  CO2 19* 23 22 24 24   GLUCOSE 200* 117* 155* 141* 131*  BUN 30* 27* 26* 23 21  CREATININE 0.91 0.89 0.87 0.84 0.78  CALCIUM 8.0* 7.9* 8.0* 8.0* 8.2*  MG  --  1.8 1.7 2.1 1.8   GFR: Estimated Creatinine Clearance: 106.7 mL/min (by C-G formula based on SCr of 0.78 mg/dL). Liver Function Tests: Recent Labs  Lab 12/25/18 1927 12/28/18 0319 12/29/18 0513 12/30/18 0329  AST 17 38 38 30  ALT 15 47* 56* 48*  ALKPHOS 116 94 99 102  BILITOT 0.9 1.0 0.8 0.8  PROT 6.7 5.4* 5.6* 5.6*  ALBUMIN 3.3* 2.6* 2.9* 2.8*   No results for input(s): LIPASE, AMYLASE in the last 168 hours. No results for input(s): AMMONIA in the last 168 hours. Coagulation Profile: No results for input(s): INR, PROTIME in the last 168 hours. Cardiac Enzymes: No results for input(s): CKTOTAL, CKMB, CKMBINDEX, TROPONINI in the last 168 hours. BNP (last 3 results) No results for input(s): PROBNP in the last 8760 hours. HbA1C: No results for input(s): HGBA1C in the last 72 hours. CBG: Recent Labs  Lab 12/29/18 0735 12/29/18 1209 12/29/18 1629 12/29/18 2237 12/30/18 0749  GLUCAP 130* 188* 115* 121* 154*   Lipid Profile: Recent Labs    12/28/18 0319  CHOL 80  HDL 28*  LDLCALC 40  TRIG 59  CHOLHDL 2.9  Thyroid Function Tests: No results for input(s): TSH, T4TOTAL, FREET4, T3FREE, THYROIDAB in the last 72 hours. Anemia Panel: No results for input(s): VITAMINB12, FOLATE, FERRITIN, TIBC, IRON, RETICCTPCT in the last 72 hours. Sepsis Labs: Recent Labs  Lab 12/25/18 1937  LATICACIDVEN 1.48    Recent Results (from the past 240 hour(s))  Blood Culture (routine x 2)     Status: None   Collection Time: 12/25/18  7:16 PM  Result Value Ref Range Status   Specimen Description BLOOD LEFT ANTECUBITAL  Final   Special Requests   Final    BOTTLES DRAWN AEROBIC AND ANAEROBIC Blood Culture results may not be optimal due to an excessive volume of blood received in  culture bottles   Culture   Final    NO GROWTH 5 DAYS Performed at [facility], [place] [address] [place], [place] [address]    Report Status 12/30/2018 FINAL  Final  Blood Culture (routine x 2)     Status: None   Collection Time: 12/25/18  7:42 PM  Result Value Ref Range Status   Specimen Description BLOOD LEFT HAND  Final   Special Requests   Final    BOTTLES DRAWN AEROBIC AND ANAEROBIC Blood Culture results may not be optimal due to an inadequate volume of blood received in culture bottles   Culture   Final    NO GROWTH 5 DAYS Performed at [facility], [place] [address] [place],  [address]    Report Status 12/30/2018 FINAL  Final         Radiology Studies: No results found.      Scheduled Meds: . apixaban  5 mg Oral BID  . calcium-vitamin D  1 tablet Oral QHS  . carvedilol  6.25 mg Oral BID WC  . docusate sodium  100 mg Oral BID  . FLUoxetine  20 mg Oral Daily  . furosemide  80 mg Intravenous BID  . hydrocerin   Topical BID  . insulin aspart  0-20 Units Subcutaneous TID WC  . rOPINIRole  4 mg Oral QHS  . rosuvastatin  20 mg Oral q1800  . sacubitril-valsartan  1 tablet Oral BID   Continuous Infusions:    LOS: 3 days   Time spent= 25 mins     Arsenio Loader, MD [facility]  If 7PM-7AM, please contact night-coverage [email or web address] 12/30/2018, 11:21 AM

## 2018-12-31 ENCOUNTER — Inpatient Hospital Stay (HOSPITAL_COMMUNITY): Payer: Medicare Other

## 2018-12-31 LAB — BASIC METABOLIC PANEL
Anion gap: 8 (ref 5–15)
BUN: 18 mg/dL (ref 8–23)
CO2: 27 mmol/L (ref 22–32)
Calcium: 8.1 mg/dL — ABNORMAL LOW (ref 8.9–10.3)
Chloride: 98 mmol/L (ref 98–111)
Creatinine, Ser: 0.74 mg/dL (ref 0.61–1.24)
GFR calc Af Amer: >60 mL/min (ref >60)
GFR calc non Af Amer: >60 mL/min (ref >60)
Glucose, Bld: 142 mg/dL — ABNORMAL HIGH (ref 70–99)
Potassium: 3.8 mmol/L (ref 3.5–5.1)
Sodium: 133 mmol/L — ABNORMAL LOW (ref 135–145)

## 2018-12-31 LAB — CBC
HCT: 35 % — ABNORMAL LOW (ref 39.0–52.0)
Hemoglobin: 10.8 g/dL — ABNORMAL LOW (ref 13.0–17.0)
MCH: 25.5 pg — AB (ref 26.0–34.0)
MCHC: 30.9 g/dL (ref 30.0–36.0)
MCV: 82.7 fL (ref 80.0–100.0)
Platelets: 207 10*3/uL (ref 150–400)
RBC: 4.23 MIL/uL (ref 4.22–5.81)
RDW: 15.4 % (ref 11.5–15.5)
WBC: 6.5 10*3/uL (ref 4.0–10.5)
nRBC: 0 % (ref 0.0–0.2)

## 2018-12-31 LAB — GLUCOSE, CAPILLARY
GLUCOSE-CAPILLARY: 220 mg/dL — AB (ref 70–99)
Glucose-Capillary: 150 mg/dL — ABNORMAL HIGH (ref 70–99)
Glucose-Capillary: 141 mg/dL — ABNORMAL HIGH (ref 70–99)
Glucose-Capillary: 142 mg/dL — ABNORMAL HIGH (ref 70–99)

## 2018-12-31 LAB — MAGNESIUM: Magnesium: 1.9 mg/dL (ref 1.7–2.4)

## 2018-12-31 MED ORDER — POTASSIUM CHLORIDE CRYS ER 20 MEQ PO TBCR
40.0000 meq | EXTENDED_RELEASE_TABLET | Freq: Once | ORAL | Status: AC
  Administered 2018-12-31: 40 meq via ORAL
  Filled 2018-12-31: qty 2

## 2018-12-31 MED ORDER — POLYETHYLENE GLYCOL 3350 17 G PO PACK: 17.0000 g | PACK | Freq: Every day | ORAL | Status: DC | PRN

## 2018-12-31 MED ORDER — METOLAZONE 5 MG PO TABS
5.0000 mg | ORAL_TABLET | Freq: Once | ORAL | Status: AC
  Administered 2018-12-31: 5 mg via ORAL
  Filled 2018-12-31: qty 1

## 2018-12-31 MED ORDER — FUROSEMIDE 10 MG/ML IJ SOLN
80.0000 mg | Freq: Three times a day (TID) | INTRAMUSCULAR | Status: DC
  Administered 2018-12-31 – 2019-01-01 (×5): 80 mg via INTRAVENOUS
  Filled 2018-12-31 (×5): qty 8

## 2018-12-31 NOTE — Progress Notes (Signed)
PROGRESS NOTE    Jeffery Nelson  JQZ:[id number] DOB: [birth year] DOA: 12/25/2018 PCP: [name], MD   Brief Narrative:  73 year old with history of chronic combined systolic and diastolic congestive heart failure status post AICD, diabetes mellitus type 2, essential hypertension came to the hospital with complaints of recent weight gain and progressive shortness of breath especially with laying flat.  He reports he has has chronic bilateral lower extremity with erythema for a long time.  Denied any fevers and chills.  He was found to be in atrial fibrillation which appears to be new diagnosis for him. Getting diuresed well with IV lasix.    Assessment & Plan:   Principal Problem:   Atrial fibrillation with rapid ventricular response (HCC) Active Problems:   RLS (restless legs syndrome)   Type 2 diabetes mellitus (HCC)   DOE (dyspnea on exertion)   Cellulitis   Hyponatremia   Chronic anemia   Humerus fracture   Chronic pain   HLD (hyperlipidemia)   Atrial fibrillation with RVR (HCC)  Acute respiratory distress secondary to CHF exacerbation Acute combined systolic and diastolic congestive heart failure, class IV, ejection fraction 20% status post AICD -Rate controlled on Coreg. -Renal function is stable, will increase Lasix 80 mg to every 8 hours for now.  Zaroxolyn added by cardiology -Resume Entresto - Check chest x-ray today.  Bilateral lower extremity chronic swelling and erythema -Erythematous but I do not think they are infected at this time.  No need for antibiotic  Persistent atrial fibrillation, RVR is resolved. -Heart rate is well controlled on Coreg 6.25 mg twice daily -Eliquis 5 mg twice daily - Cardiology following -Echocardiogram yesterday showed ejection fraction 20-25%, mild LVH, mild MR, severely dilated left atrium, moderately dilated right atrium, mild MR.  Echo in June 2019 had similar findings  Physical deconditioning secondary to morbid obesity and  multiple medical comorbidities -PT/OT is recommending-SNF  Anemia of chronic disease -Around baseline of 11.  No obvious signs of bleeding.  Diabetes mellitus type 2 -Hemoglobin A1c 7.6.  Proximal right humerus fracture -Previously seen by orthopedic outpatient on December 12, 2018-sling in place.  Needs to follow-up outpatient with orthopedic  Restless leg syndrome -Continue ropinirole  Hyperlipidemia -Continue Crestor  DVT prophylaxis: Eliquis Code Status: Full Code  Family Communication:  None at bedside Disposition Plan: Cont in hospital stay for aggressive IV diuretics.   Consultants:   Cardiology  Procedures:   None  Antimicrobials:   Vancomycin and Rocephin stopped 1/8   Subjective: Feels a little more short of breath today than yesterday. Had an acute sob episode overnight which improved with Supplemental o2. He wasn't hypoxic at that time.   Review of Systems Otherwise negative except as per HPI, including: General = no fevers, chills, dizziness, malaise, fatigue HEENT/EYES = negative for pain, redness, loss of vision, double vision, blurred vision, loss of hearing, sore throat, hoarseness, dysphagia Cardiovascular= negative for chest pain, palpitation, murmurs, lower extremity swelling Respiratory/lungs= negative for shortness of breath, cough, hemoptysis, wheezing, mucus production Gastrointestinal= negative for nausea, vomiting,, abdominal pain, melena, hematemesis Genitourinary= negative for Dysuria, Hematuria, Change in Urinary Frequency MSK = Negative for arthralgia, myalgias, Back Pain, Joint swelling  Neurology= Negative for headache, seizures, numbness, tingling  Psychiatry= Negative for anxiety, depression, suicidal and homocidal ideation Allergy/Immunology= Medication/Food allergy as listed  Skin= Negative for Rash, lesions, ulcers, itching     Objective: Vitals:   12/30/18 2356 12/31/18 0420 12/31/18 0746 12/31/18 1130  BP: 105/64 105/72  109/67  107/84  Pulse: 83 85 82 90  Resp:   15 (!) 23  Temp: (!) 97.5 F (36.4 C) 97.7 F (36.5 C) (!) 97.5 F (36.4 C) (!) 97.4 F (36.3 C)  TempSrc: Oral Oral Oral Oral  SpO2: 91% 95% 96% 95%  Weight:  (!) 139.9 kg    Height:        Intake/Output Summary (Last 24 hours) at 12/31/2018 1142 Last data filed at 12/31/2018 0059 Gross per 24 hour  Intake 718 ml  Output 1400 ml  Net -682 ml   Filed Weights   12/29/18 0500 12/30/18 0559 12/31/18 0420  Weight: 135.9 kg (!) 136.9 kg (!) 139.9 kg    Examination: Constitutional: NAD, calm, comfortable Eyes: PERRL, lids and conjunctivae normal ENMT: Mucous membranes are moist. Posterior pharynx clear of any exudate or lesions.Normal dentition.  Neck: normal, supple, no masses, no thyromegaly Respiratory: diminished BS with b/; crackles mid way up his lung fields.  Cardiovascular: Regular rate and rhythm, no murmurs / rubs / gallops. 2+ b/l le pitting edema. 2+ pedal pulses. No carotid bruits.  Abdomen: no tenderness, no masses palpated. No hepatosplenomegaly. Bowel sounds positive.  Musculoskeletal: no clubbing / cyanosis. No joint deformity upper and lower extremities. Good ROM, no contractures. Normal muscle tone.  Skin: Bilateral lower extremity chronic skin changes Neurologic: CN 2-12 grossly intact. Sensation intact, DTR normal. Strength 5/5 in all 4.  Psychiatric: Normal judgment and insight. Alert and oriented x 3. Normal mood.   Data Reviewed:   CBC: Recent Labs  Lab 12/25/18 1927 12/27/18 0525 12/28/18 0319 12/29/18 0513 12/30/18 0329 12/31/18 0400  WBC 8.1 7.9 8.3 6.7 7.4 6.5  NEUTROABS 6.4  --   --   --   --   --   HGB 11.2* 11.3* 10.0* 10.7* 10.7* 10.8*  HCT 36.0* 35.2* 30.7* 33.8* 33.8* 35.0*  MCV 82.6 82.6 81.9 81.3 82.0 82.7  PLT 239 223 228 230 234 202   Basic Metabolic Panel: Recent Labs  Lab 12/28/18 0319 12/28/18 2042 12/29/18 0513 12/30/18 0329 12/31/18 0400 12/31/18 0811  NA 130* 131* 131* 133*   --  133*  K 4.1 4.3 4.1 3.8  --  3.8  CL 99 99 97* 98  --  98  CO2 23 22 24 24   --  27  GLUCOSE 117* 155* 141* 131*  --  142*  BUN 27* 26* 23 21  --  18  CREATININE 0.89 0.87 0.84 0.78  --  0.74  CALCIUM 7.9* 8.0* 8.0* 8.2*  --  8.1*  MG 1.8 1.7 2.1 1.8 1.9  --    GFR: Estimated Creatinine Clearance: 108.1 mL/min (by C-G formula based on SCr of 0.74 mg/dL). Liver Function Tests: Recent Labs  Lab 12/25/18 1927 12/28/18 0319 12/29/18 0513 12/30/18 0329  AST 17 38 38 30  ALT 15 47* 56* 48*  ALKPHOS 116 94 99 102  BILITOT 0.9 1.0 0.8 0.8  PROT 6.7 5.4* 5.6* 5.6*  ALBUMIN 3.3* 2.6* 2.9* 2.8*   No results for input(s): LIPASE, AMYLASE in the last 168 hours. No results for input(s): AMMONIA in the last 168 hours. Coagulation Profile: No results for input(s): INR, PROTIME in the last 168 hours. Cardiac Enzymes: No results for input(s): CKTOTAL, CKMB, CKMBINDEX, TROPONINI in the last 168 hours. BNP (last 3 results) No results for input(s): PROBNP in the last 8760 hours. HbA1C: No results for input(s): HGBA1C in the last 72 hours. CBG: Recent Labs  Lab 12/30/18 1211 12/30/18 1714  12/30/18 2106 12/31/18 0744 12/31/18 1127  GLUCAP 122* 131* 150* 150* 141*   Lipid Profile: No results for input(s): CHOL, HDL, LDLCALC, TRIG, CHOLHDL, LDLDIRECT in the last 72 hours. Thyroid Function Tests: No results for input(s): TSH, T4TOTAL, FREET4, T3FREE, THYROIDAB in the last 72 hours. Anemia Panel: No results for input(s): VITAMINB12, FOLATE, FERRITIN, TIBC, IRON, RETICCTPCT in the last 72 hours. Sepsis Labs: Recent Labs  Lab 12/25/18 1937  LATICACIDVEN 1.48    Recent Results (from the past 240 hour(s))  Blood Culture (routine x 2)     Status: None   Collection Time: 12/25/18  7:16 PM  Result Value Ref Range Status   Specimen Description BLOOD LEFT ANTECUBITAL  Final   Special Requests   Final    BOTTLES DRAWN AEROBIC AND ANAEROBIC Blood Culture results may not be optimal due  to an excessive volume of blood received in culture bottles   Culture   Final    NO GROWTH 5 DAYS Performed at [facility], [place] [address] [place], [place] [address]    Report Status 12/30/2018 FINAL  Final  Blood Culture (routine x 2)     Status: None   Collection Time: 12/25/18  7:42 PM  Result Value Ref Range Status   Specimen Description BLOOD LEFT HAND  Final   Special Requests   Final    BOTTLES DRAWN AEROBIC AND ANAEROBIC Blood Culture results may not be optimal due to an inadequate volume of blood received in culture bottles   Culture   Final    NO GROWTH 5 DAYS Performed at [facility], [place] [address], [place], [place] [address]    Report Status 12/30/2018 FINAL  Final         Radiology Studies: No results found.      Scheduled Meds: . apixaban  5 mg Oral BID  . calcium-vitamin D  1 tablet Oral QHS  . carvedilol  6.25 mg Oral BID WC  . docusate sodium  100 mg Oral BID  . FLUoxetine  20 mg Oral Daily  . furosemide  80 mg Intravenous Q8H  . hydrocerin   Topical BID  . insulin aspart  0-20 Units Subcutaneous TID WC  . rOPINIRole  4 mg Oral QHS  . rosuvastatin  20 mg Oral q1800  . sacubitril-valsartan  1 tablet Oral BID   Continuous Infusions:    LOS: 4 days   Time spent= 35 mins    Aaliyha Mumford Arsenio Loader, MD [facility]  If 7PM-7AM, please contact night-coverage [email or web address] 12/31/2018, 11:42 AM

## 2018-12-31 NOTE — Progress Notes (Signed)
Incentive spirometer and Flutter valve education given to pt.  Encouraged to perform tasks at least 10x/hr each device.  Pt verbalized instructions and demonstrated use.

## 2018-12-31 NOTE — Progress Notes (Signed)
Progress Note  Patient Name: Jeffery Nelson Date of Encounter: 12/31/2018  Primary Cardiologist:   Pixie Casino, MD   Subjective   Still not breathing at baseline  Inpatient Medications    Scheduled Meds: . apixaban  5 mg Oral BID  . calcium-vitamin D  1 tablet Oral QHS  . carvedilol  6.25 mg Oral BID WC  . docusate sodium  100 mg Oral BID  . FLUoxetine  20 mg Oral Daily  . furosemide  80 mg Intravenous BID  . hydrocerin   Topical BID  . insulin aspart  0-20 Units Subcutaneous TID WC  . rOPINIRole  4 mg Oral QHS  . rosuvastatin  20 mg Oral q1800  . sacubitril-valsartan  1 tablet Oral BID   Continuous Infusions:  PRN Meds: acetaminophen, alum & mag hydroxide-simeth, HYDROcodone-acetaminophen, methocarbamol   Vital Signs    Vitals:   12/30/18 1945 12/30/18 2356 12/31/18 0420 12/31/18 0746  BP: 98/66 105/64 105/72 109/67  Pulse: 82 83 85 82  Resp:    15  Temp: (!) 97.5 F (36.4 C) (!) 97.5 F (36.4 C) 97.7 F (36.5 C) (!) 97.5 F (36.4 C)  TempSrc: Oral Oral Oral Oral  SpO2: 94% 91% 95% 96%  Weight:   (!) 139.9 kg   Height:        Intake/Output Summary (Last 24 hours) at 12/31/2018 1023 Last data filed at 12/31/2018 0059 Gross per 24 hour  Intake 718 ml  Output 1400 ml  Net -682 ml   Filed Weights   12/29/18 0500 12/30/18 0559 12/31/18 0420  Weight: 135.9 kg (!) 136.9 kg (!) 139.9 kg    Telemetry    Atrial fib with controlled rate.  - Personally Reviewed  ECG    No  - Personally Reviewed  Physical Exam   GEN: No  acute distress.   Neck: No  JVD Cardiac: Irregular RR, no murmurs, rubs, or gallops.  Respiratory:    Decreased breath sounds with basilar crackles GI: Soft, nontender, non-distended, normal bowel sounds  MS:  Moderate edema; No deformity. Neuro:   Nonfocal  Psych: Oriented and appropriate   Labs    Chemistry Recent Labs  Lab 12/28/18 0319  12/29/18 0513 12/30/18 0329 12/31/18 0811  NA 130*   < > 131* 133* 133*  K  4.1   < > 4.1 3.8 3.8  CL 99   < > 97* 98 98  CO2 23   < > 24 24 27   GLUCOSE 117*   < > 141* 131* 142*  BUN 27*   < > 23 21 18   CREATININE 0.89   < > 0.84 0.78 0.74  CALCIUM 7.9*   < > 8.0* 8.2* 8.1*  PROT 5.4*  --  5.6* 5.6*  --   ALBUMIN 2.6*  --  2.9* 2.8*  --   AST 38  --  38 30  --   ALT 47*  --  56* 48*  --   ALKPHOS 94  --  99 102  --   BILITOT 1.0  --  0.8 0.8  --   GFRNONAA >60   < > >60 >60 >60  GFRAA >60   < > >60 >60 >60  ANIONGAP 8   < > 10 11 8    < > = values in this interval not displayed.     Hematology Recent Labs  Lab 12/29/18 0513 12/30/18 0329 12/31/18 0400  WBC 6.7 7.4 6.5  RBC 4.16* 4.12* 4.23  HGB 10.7* 10.7* 10.8*  HCT 33.8* 33.8* 35.0*  MCV 81.3 82.0 82.7  MCH 25.7* 26.0 25.5*  MCHC 31.7 31.7 30.9  RDW 15.4 15.3 15.4  PLT 230 234 207    Cardiac EnzymesNo results for input(s): TROPONINI in the last 168 hours.  Recent Labs  Lab 12/25/18 1538  TROPIPOC 0.02     BNP Recent Labs  Lab 12/25/18 1927 12/28/18 0319  BNP 1,266.8* 1,379.5*     DDimer No results for input(s): DDIMER in the last 168 hours.   Radiology    No results found.  Cardiac Studies   ECHO:  12/26/2018 - Left ventricle: The cavity size was mildly dilated. Wall thickness was increased in a pattern of mild LVH. Systolic function was severely reduced. The estimated ejection fraction was in the range of 20% to 25%. Wall motion was normal; there were no regional wall motion abnormalities. The study was not technically sufficient to allow evaluation of LV diastolic dysfunction due to atrial fibrillation. - Aortic valve: Trileaflet; mildly thickened, mildly calcified leaflets. - Mitral valve: Calcified annulus. There was mild regurgitation. - Left atrium: The atrium was severely dilated. - Right atrium: The atrium was moderately dilated. - Tricuspid valve: There was mild regurgitation. - Pulmonary arteries: PA peak pressure: 31 mm Hg (S).  Patient  Profile     73 y.o. male w/ hx NICM, chronic combined systolic and diastolic HF, nonobstructive CAD by cath in 2017, h/o syncope presumed to be VT s/p AICD ([employer]) followed by Dr. Lovena Le, HTN, HLD, T2DM, S-D-CHF, and presumed OSA, broken R humerus after a fall 12/12/2018 was admitted 12/26/2018 w/ acute on chronic S-D-CHF and Afib (new dx), CHA2DS2-VASc = 5 (CHF, HTN, DM, age x 1, vasc dz). Cards saw 01/08  Assessment & Plan    ATRIAL FIB:   Rate OK.    Continue current therapy.  On Eliquis  ACUTE ON CHRONIC DIASTOLIC AND SYSTOLIC HF:   Negative 2.4 liters since admission.    Weight seems to be up.   I will give a dose of Zaroxolyn today.    PROBABLE OSA:  Needs evaluation as an outpatient.   VT:  No further.   For questions or updates, please contact [place] Please consult [email or web address] for contact info under Cardiology/STEMI.   Signed, Minus Breeding, MD  12/31/2018, 10:23 AM

## 2019-01-01 DIAGNOSIS — I5043 Acute on chronic combined systolic (congestive) and diastolic (congestive) heart failure: Secondary | ICD-10-CM

## 2019-01-01 LAB — BASIC METABOLIC PANEL
Anion gap: 10 (ref 5–15)
BUN: 16 mg/dL (ref 8–23)
CO2: 30 mmol/L (ref 22–32)
Calcium: 8.3 mg/dL — ABNORMAL LOW (ref 8.9–10.3)
Chloride: 95 mmol/L — ABNORMAL LOW (ref 98–111)
Creatinine, Ser: 0.79 mg/dL (ref 0.61–1.24)
GFR calc Af Amer: >60 mL/min (ref >60)
GFR calc non Af Amer: >60 mL/min (ref >60)
Glucose, Bld: 133 mg/dL — ABNORMAL HIGH (ref 70–99)
Potassium: 3.7 mmol/L (ref 3.5–5.1)
Sodium: 135 mmol/L (ref 135–145)

## 2019-01-01 LAB — CBC
HCT: 36.8 % — ABNORMAL LOW (ref 39.0–52.0)
Hemoglobin: 11.3 g/dL — ABNORMAL LOW (ref 13.0–17.0)
MCH: 25.5 pg — ABNORMAL LOW (ref 26.0–34.0)
MCHC: 30.7 g/dL (ref 30.0–36.0)
MCV: 83.1 fL (ref 80.0–100.0)
Platelets: 218 10*3/uL (ref 150–400)
RBC: 4.43 MIL/uL (ref 4.22–5.81)
RDW: 15.6 % — ABNORMAL HIGH (ref 11.5–15.5)
WBC: 6.8 10*3/uL (ref 4.0–10.5)
nRBC: 0 % (ref 0.0–0.2)

## 2019-01-01 LAB — GLUCOSE, CAPILLARY
Glucose-Capillary: 119 mg/dL — ABNORMAL HIGH (ref 70–99)
Glucose-Capillary: 138 mg/dL — ABNORMAL HIGH (ref 70–99)
Glucose-Capillary: 155 mg/dL — ABNORMAL HIGH (ref 70–99)
Glucose-Capillary: 125 mg/dL — ABNORMAL HIGH (ref 70–99)

## 2019-01-01 LAB — MAGNESIUM: Magnesium: 1.7 mg/dL (ref 1.7–2.4)

## 2019-01-01 MED ORDER — POTASSIUM CHLORIDE CRYS ER 20 MEQ PO TBCR
40.0000 meq | EXTENDED_RELEASE_TABLET | Freq: Once | ORAL | Status: AC
  Administered 2019-01-01: 40 meq via ORAL
  Filled 2019-01-01: qty 2

## 2019-01-01 MED ORDER — MAGNESIUM OXIDE 400 (241.3 MG) MG PO TABS
800.0000 mg | ORAL_TABLET | Freq: Once | ORAL | Status: AC
  Administered 2019-01-01: 800 mg via ORAL
  Filled 2019-01-01: qty 2

## 2019-01-01 NOTE — Progress Notes (Signed)
PROGRESS NOTE    Jeffery Nelson  IDP:[id number] DOB: [birth year] DOA: 12/25/2018 PCP: [name], MD   Brief Narrative:  73 year old with history of chronic combined systolic and diastolic congestive heart failure status post AICD, diabetes mellitus type 2, essential hypertension came to the hospital with complaints of recent weight gain and progressive shortness of breath especially with laying flat.  He reports he has has chronic bilateral lower extremity with erythema for a long time.  Denied any fevers and chills.  He was found to be in atrial fibrillation which appears to be new diagnosis for him. Getting diuresed well with IV lasix.    Assessment & Plan:   Principal Problem:   Atrial fibrillation with rapid ventricular response (HCC) Active Problems:   RLS (restless legs syndrome)   Type 2 diabetes mellitus (HCC)   DOE (dyspnea on exertion)   Cellulitis   Hyponatremia   Chronic anemia   Humerus fracture   Chronic pain   HLD (hyperlipidemia)   Atrial fibrillation with RVR (HCC)  Acute respiratory distress secondary to CHF exacerbation Acute combined systolic and diastolic congestive heart failure, class III, ejection fraction 20% status post AICD -Rate controlled on Coreg. -Responded well to Zaroxolyn and Lasix yesterday, will continue 80 mg of Lasix every 8 hours today. -Resume Entresto - Chest x-ray 1/12- pulmonary edema. -Closely monitor electrolytes and replete as necessary.  Bilateral lower extremity chronic swelling and erythema -No evidence of infection at the moment.  Slightly erythematous.  No antibiotics needed.  Persistent atrial fibrillation, RVR is resolved. -Heart rate is well controlled on Coreg 6.25 mg twice daily -Cont Eliquis 5mg  BID - Cardiology following -Echocardiogram yesterday showed ejection fraction 20-25%, mild LVH, mild MR, severely dilated left atrium, moderately dilated right atrium, mild MR.  Echo in June 2019 had similar  findings  Physical deconditioning secondary to morbid obesity and multiple medical comorbidities -PT/OT is recommending-SNF  Anemia of chronic disease -Around baseline of 11.  No obvious signs of bleeding.  Diabetes mellitus type 2 -Hemoglobin A1c 7.6.  Proximal right humerus fracture -Previously seen by orthopedic outpatient on December 12, 2018-sling in place.  Needs to follow-up outpatient with orthopedic  Restless leg syndrome -Continue ropinirole  Hyperlipidemia -Continue Crestor  DVT prophylaxis: Eliquis Code Status: Full Code  Family Communication:  None at bedside Disposition Plan: Cont Hospitalization until he is able to come off IV Diuretics.   Consultants:   Cardiology  Procedures:   None  Antimicrobials:   Vancomycin and Rocephin stopped 1/8   Subjective: Patient states his shortness of breath is slightly better.  He was able to urinate quite a bit in last 24 hours.  His lower extremity swelling has improved as well.  Review of Systems Otherwise negative except as per HPI, including: General = no fevers, chills, dizziness, malaise, fatigue HEENT/EYES = negative for pain, redness, loss of vision, double vision, blurred vision, loss of hearing, sore throat, hoarseness, dysphagia Cardiovascular= negative for chest pain, palpitation, murmurs, lower extremity swelling Respiratory/lungs= negative for shortness of breath, cough, hemoptysis, wheezing, mucus production Gastrointestinal= negative for nausea, vomiting,, abdominal pain, melena, hematemesis Genitourinary= negative for Dysuria, Hematuria, Change in Urinary Frequency MSK = Negative for arthralgia, myalgias, Back Pain, Joint swelling  Neurology= Negative for headache, seizures, numbness, tingling  Psychiatry= Negative for anxiety, depression, suicidal and homocidal ideation Allergy/Immunology= Medication/Food allergy as listed  Skin= Negative for Rash, lesions, ulcers, itching   Objective: Vitals:    12/31/18 1628 12/31/18 2129 01/01/19 0419 01/01/19 4431  BP: 113/73 103/67 117/61 117/74  Pulse: 89 80 85 92  Resp:  (!) 23 (!) 21 17  Temp: 97.8 F (36.6 C) 98 F (36.7 C) (!) 97.5 F (36.4 C) 98.4 F (36.9 C)  TempSrc: Oral Oral Oral Oral  SpO2: 96% 93% 98% 100%  Weight:   130.8 kg   Height:        Intake/Output Summary (Last 24 hours) at 01/01/2019 1049 Last data filed at 01/01/2019 0824 Gross per 24 hour  Intake 120 ml  Output 5100 ml  Net -4980 ml   Filed Weights   12/30/18 0559 12/31/18 0420 01/01/19 0419  Weight: (!) 136.9 kg (!) 139.9 kg 130.8 kg    Examination: Constitutional: NAD, calm, comfortable Eyes: PERRL, lids and conjunctivae normal ENMT: Mucous membranes are moist. Posterior pharynx clear of any exudate or lesions.Normal dentition.  Neck: normal, supple, no masses, no thyromegaly Respiratory: Breath sounds at the bases Cardiovascular: Regular rate and rhythm, no murmurs / rubs / gallops.  2+ bilateral lower extremity pitting edema. 2+ pedal pulses. No carotid bruits.  Abdomen: no tenderness, no masses palpated. No hepatosplenomegaly. Bowel sounds positive.  Musculoskeletal: no clubbing / cyanosis. No joint deformity upper and lower extremities. Good ROM, no contractures. Normal muscle tone.  Skin: Bilateral lower extremity chronic skin changes with erythema but no warmth or obvious evidence of infection. Neurologic: CN 2-12 grossly intact. Sensation intact, DTR normal. Strength 5/5 in all 4.  Psychiatric: Normal judgment and insight. Alert and oriented x 3. Normal mood.   Data Reviewed:   CBC: Recent Labs  Lab 12/25/18 1927  12/28/18 0319 12/29/18 0513 12/30/18 0329 12/31/18 0400 01/01/19 0409  WBC 8.1   < > 8.3 6.7 7.4 6.5 6.8  NEUTROABS 6.4  --   --   --   --   --   --   HGB 11.2*   < > 10.0* 10.7* 10.7* 10.8* 11.3*  HCT 36.0*   < > 30.7* 33.8* 33.8* 35.0* 36.8*  MCV 82.6   < > 81.9 81.3 82.0 82.7 83.1  PLT 239   < > 228 230 234 207 218    < > = values in this interval not displayed.   Basic Metabolic Panel: Recent Labs  Lab 12/28/18 2042 12/29/18 0513 12/30/18 0329 12/31/18 0400 12/31/18 0811 01/01/19 0409  NA 131* 131* 133*  --  133* 135  K 4.3 4.1 3.8  --  3.8 3.7  CL 99 97* 98  --  98 95*  CO2 22 24 24   --  27 30  GLUCOSE 155* 141* 131*  --  142* 133*  BUN 26* 23 21  --  18 16  CREATININE 0.87 0.84 0.78  --  0.74 0.79  CALCIUM 8.0* 8.0* 8.2*  --  8.1* 8.3*  MG 1.7 2.1 1.8 1.9  --  1.7   GFR: Estimated Creatinine Clearance: 103.8 mL/min (by C-G formula based on SCr of 0.79 mg/dL). Liver Function Tests: Recent Labs  Lab 12/25/18 1927 12/28/18 0319 12/29/18 0513 12/30/18 0329  AST 17 38 38 30  ALT 15 47* 56* 48*  ALKPHOS 116 94 99 102  BILITOT 0.9 1.0 0.8 0.8  PROT 6.7 5.4* 5.6* 5.6*  ALBUMIN 3.3* 2.6* 2.9* 2.8*   No results for input(s): LIPASE, AMYLASE in the last 168 hours. No results for input(s): AMMONIA in the last 168 hours. Coagulation Profile: No results for input(s): INR, PROTIME in the last 168 hours. Cardiac Enzymes: No results for input(s): CKTOTAL,  CKMB, CKMBINDEX, TROPONINI in the last 168 hours. BNP (last 3 results) No results for input(s): PROBNP in the last 8760 hours. HbA1C: No results for input(s): HGBA1C in the last 72 hours. CBG: Recent Labs  Lab 12/31/18 0744 12/31/18 1127 12/31/18 1625 12/31/18 2128 01/01/19 0810  GLUCAP 150* 141* 142* 220* 119*   Lipid Profile: No results for input(s): CHOL, HDL, LDLCALC, TRIG, CHOLHDL, LDLDIRECT in the last 72 hours. Thyroid Function Tests: No results for input(s): TSH, T4TOTAL, FREET4, T3FREE, THYROIDAB in the last 72 hours. Anemia Panel: No results for input(s): VITAMINB12, FOLATE, FERRITIN, TIBC, IRON, RETICCTPCT in the last 72 hours. Sepsis Labs: Recent Labs  Lab 12/25/18 1937  LATICACIDVEN 1.48    Recent Results (from the past 240 hour(s))  Blood Culture (routine x 2)     Status: None   Collection Time: 12/25/18   7:16 PM  Result Value Ref Range Status   Specimen Description BLOOD LEFT ANTECUBITAL  Final   Special Requests   Final    BOTTLES DRAWN AEROBIC AND ANAEROBIC Blood Culture results may not be optimal due to an excessive volume of blood received in culture bottles   Culture   Final    NO GROWTH 5 DAYS Performed at [facility], [place] [address] [place], [place] [address]    Report Status 12/30/2018 FINAL  Final  Blood Culture (routine x 2)     Status: None   Collection Time: 12/25/18  7:42 PM  Result Value Ref Range Status   Specimen Description BLOOD LEFT HAND  Final   Special Requests   Final    BOTTLES DRAWN AEROBIC AND ANAEROBIC Blood Culture results may not be optimal due to an inadequate volume of blood received in culture bottles   Culture   Final    NO GROWTH 5 DAYS Performed at [facility], [place] [address], [place], [place] [address]    Report Status 12/30/2018 FINAL  Final         Radiology Studies: Dg Chest Port 1 View  Result Date: 12/31/2018 CLINICAL DATA:  Acute onset of shortness of breath and cough. EXAM: PORTABLE CHEST 1 VIEW COMPARISON:  12/25/2018 and earlier, including CTA chest 12/25/2018. FINDINGS: Markedly suboptimal inspiration. Cardiac silhouette markedly enlarged, unchanged. LEFT subclavian pacemaker lead tip at the expected location of the RV apex, unchanged. Pulmonary venous hypertension and mild interstitial pulmonary edema which is new since the examination 6 days ago. No confluent airspace consolidation. Stable chronic elevation of the RIGHT hemidiaphragm with chronic scar/atelectasis at the RIGHT base IMPRESSION: Suboptimal inspiration. Mild acute CHF, with stable marked cardiomegaly and mild diffuse interstitial pulmonary edema. Electronically Signed   By: Evangeline Dakin M.D.   On: 12/31/2018 20:43        Scheduled Meds: . apixaban  5 mg Oral BID  . calcium-vitamin D  1 tablet Oral QHS  . carvedilol  6.25 mg Oral BID WC  . docusate  sodium  100 mg Oral BID  . FLUoxetine  20 mg Oral Daily  . furosemide  80 mg Intravenous Q8H  . hydrocerin   Topical BID  . insulin aspart  0-20 Units Subcutaneous TID WC  . rOPINIRole  4 mg Oral QHS  . rosuvastatin  20 mg Oral q1800  . sacubitril-valsartan  1 tablet Oral BID   Continuous Infusions:    LOS: 5 days   Time spent= 35 mins    Ankit Arsenio Loader, MD [facility]  If 7PM-7AM, please contact night-coverage  [email or web address] 01/01/2019, 10:49 AM

## 2019-01-01 NOTE — Social Work (Signed)
[facility] cannot offer a bed for at this time. Patient's other choice, [facility], can offer a bed. Confirmed bed and [facility] will start patient's [facility] authorization today. Patient will need auth before admitting to the facility.  Met with patient at bedside and informed patient of the above. Patient understanding and is agreeable to [place]. CSW to follow for medical readiness and insurance auth and will support with discharge planning.  Estanislado Emms, LCSW [phone]

## 2019-01-01 NOTE — Progress Notes (Signed)
Progress Note  Patient Name: Jeffery Nelson Date of Encounter: 01/01/2019  Primary Cardiologist:   Pixie Casino, MD   Subjective   No chest pain. Breathing is improved.   Inpatient Medications    Scheduled Meds: . apixaban  5 mg Oral BID  . calcium-vitamin D  1 tablet Oral QHS  . carvedilol  6.25 mg Oral BID WC  . docusate sodium  100 mg Oral BID  . FLUoxetine  20 mg Oral Daily  . furosemide  80 mg Intravenous Q8H  . hydrocerin   Topical BID  . insulin aspart  0-20 Units Subcutaneous TID WC  . rOPINIRole  4 mg Oral QHS  . rosuvastatin  20 mg Oral q1800  . sacubitril-valsartan  1 tablet Oral BID   Continuous Infusions:  PRN Meds: acetaminophen, alum & mag hydroxide-simeth, HYDROcodone-acetaminophen, methocarbamol, polyethylene glycol   Vital Signs    Vitals:   12/31/18 1628 12/31/18 2129 01/01/19 0419 01/01/19 0819  BP: 113/73 103/67 117/61 117/74  Pulse: 89 80 85 92  Resp:  (!) 23 (!) 21 17  Temp: 97.8 F (36.6 C) 98 F (36.7 C) (!) 97.5 F (36.4 C) 98.4 F (36.9 C)  TempSrc: Oral Oral Oral Oral  SpO2: 96% 93% 98% 100%  Weight:   130.8 kg   Height:        Intake/Output Summary (Last 24 hours) at 01/01/2019 0956 Last data filed at 01/01/2019 0824 Gross per 24 hour  Intake 120 ml  Output 5100 ml  Net -4980 ml   Filed Weights   12/30/18 0559 12/31/18 0420 01/01/19 0419  Weight: (!) 136.9 kg (!) 139.9 kg 130.8 kg    Telemetry    Atrial fib, rate 90s. NSVT- Personally Reviewed  ECG    No AM EKG - Personally Reviewed  Physical Exam    General: Well developed, well nourished, NAD  HEENT: OP clear, mucus membranes moist  SKIN: warm, dry. No rashes. Neuro: No focal deficits  Musculoskeletal: Muscle strength 5/5 all ext  Psychiatric: Mood and affect normal  Neck: No JVD, no carotid bruits, no thyromegaly, no lymphadenopathy.  Lungs:Clear bilaterally, no wheezes, rhonci, crackles Cardiovascular: Irreg irreg. Regular rate and rhythm. No  murmurs, gallops or rubs. Abdomen:Soft. Bowel sounds present. Non-tender.  Extremities: No lower extremity edema. Pulses are 2 + in the bilateral DP/PT.    Labs    Chemistry Recent Labs  Lab 12/28/18 0319  12/29/18 0513 12/30/18 0329 12/31/18 0811 01/01/19 0409  NA 130*   < > 131* 133* 133* 135  K 4.1   < > 4.1 3.8 3.8 3.7  CL 99   < > 97* 98 98 95*  CO2 23   < > 24 24 27 30   GLUCOSE 117*   < > 141* 131* 142* 133*  BUN 27*   < > 23 21 18 16   CREATININE 0.89   < > 0.84 0.78 0.74 0.79  CALCIUM 7.9*   < > 8.0* 8.2* 8.1* 8.3*  PROT 5.4*  --  5.6* 5.6*  --   --   ALBUMIN 2.6*  --  2.9* 2.8*  --   --   AST 38  --  38 30  --   --   ALT 47*  --  56* 48*  --   --   ALKPHOS 94  --  99 102  --   --   BILITOT 1.0  --  0.8 0.8  --   --   GFRNONAA >  60   < > >60 >60 >60 >60  GFRAA >60   < > >60 >60 >60 >60  ANIONGAP 8   < > 10 11 8 10    < > = values in this interval not displayed.     Hematology Recent Labs  Lab 12/30/18 0329 12/31/18 0400 01/01/19 0409  WBC 7.4 6.5 6.8  RBC 4.12* 4.23 4.43  HGB 10.7* 10.8* 11.3*  HCT 33.8* 35.0* 36.8*  MCV 82.0 82.7 83.1  MCH 26.0 25.5* 25.5*  MCHC 31.7 30.9 30.7  RDW 15.3 15.4 15.6*  PLT 234 207 218    Cardiac EnzymesNo results for input(s): TROPONINI in the last 168 hours.  Recent Labs  Lab 12/25/18 1538  TROPIPOC 0.02     BNP Recent Labs  Lab 12/25/18 1927 12/28/18 0319  BNP 1,266.8* 1,379.5*     DDimer No results for input(s): DDIMER in the last 168 hours.   Radiology    Dg Chest Port 1 View  Result Date: 12/31/2018 CLINICAL DATA:  Acute onset of shortness of breath and cough. EXAM: PORTABLE CHEST 1 VIEW COMPARISON:  12/25/2018 and earlier, including CTA chest 12/25/2018. FINDINGS: Markedly suboptimal inspiration. Cardiac silhouette markedly enlarged, unchanged. LEFT subclavian pacemaker lead tip at the expected location of the RV apex, unchanged. Pulmonary venous hypertension and mild interstitial pulmonary edema which  is new since the examination 6 days ago. No confluent airspace consolidation. Stable chronic elevation of the RIGHT hemidiaphragm with chronic scar/atelectasis at the RIGHT base IMPRESSION: Suboptimal inspiration. Mild acute CHF, with stable marked cardiomegaly and mild diffuse interstitial pulmonary edema. Electronically Signed   By: Evangeline Dakin M.D.   On: 12/31/2018 20:43    Cardiac Studies   ECHO:  12/26/2018 - Left ventricle: The cavity size was mildly dilated. Wall thickness was increased in a pattern of mild LVH. Systolic function was severely reduced. The estimated ejection fraction was in the range of 20% to 25%. Wall motion was normal; there were no regional wall motion abnormalities. The study was not technically sufficient to allow evaluation of LV diastolic dysfunction due to atrial fibrillation. - Aortic valve: Trileaflet; mildly thickened, mildly calcified leaflets. - Mitral valve: Calcified annulus. There was mild regurgitation. - Left atrium: The atrium was severely dilated. - Right atrium: The atrium was moderately dilated. - Tricuspid valve: There was mild regurgitation. - Pulmonary arteries: PA peak pressure: 31 mm Hg (S).  Patient Profile     73 y.o. male w/ hx NICM, chronic combined systolic and diastolic HF, nonobstructive CAD by cath in 2017, h/o syncope presumed to be VT s/p AICD ([employer]) followed by Dr. Lovena Le, HTN, HLD, T2DM, and presumed sleep apnea, broken R humerus after a fall 12/12/2018. He was admitted 12/26/2018 w/ acute on chronic systolic and diastolic CHF and new onset atrial fibrillation. CHA2DS2-VASc = 5 (CHF, HTN, DM, age x 1, vasc dz).   Assessment & Plan    1. Atrial fibrillation: Rate is controlled. Will continue Coreg and Eliquis.   2. Non-ischemic cardiomyopathy/Acute on chronic diastolic and systolic CHF: He is diuresing well. Negative 5 liters last 24 hours. Negative 7.6 liters since admission. Still above his dry weight  which is around 120 kg. Difficult to assess volume status given his body habitus. I would continue IV Lasix today. Renal function is stable.   3. NSVT:  Several short runs NSVT. Continue beta blocker.   For questions or updates, please contact [place] Please consult [email or web address] for contact info under Cardiology/STEMI.  Signed, Lauree Chandler, MD  01/01/2019, 9:56 AM

## 2019-01-02 LAB — CUP PACEART REMOTE DEVICE CHECK
Battery Remaining Longevity: 80 mo
Battery Remaining Percentage: 80 %
Battery Voltage: 2.99 V
Brady Statistic RV Percent Paced: 1 %
Date Time Interrogation Session: 20191118070021
HIGH POWER IMPEDANCE MEASURED VALUE: 79 Ohm
HIGH POWER IMPEDANCE MEASURED VALUE: 79 Ohm
Implantable Lead Implant Date: 20170703
Implantable Lead Location: 753860
Implantable Lead Model: 7122
Implantable Pulse Generator Implant Date: 20170703
Lead Channel Impedance Value: 460 Ohm
Lead Channel Pacing Threshold Amplitude: 1.25 V
Lead Channel Pacing Threshold Pulse Width: 0.5 ms
Lead Channel Sensing Intrinsic Amplitude: 12 mV
Lead Channel Setting Pacing Pulse Width: 0.5 ms
Lead Channel Setting Sensing Sensitivity: 0.5 mV
MDC IDC SET LEADCHNL RV PACING AMPLITUDE: 2.5 V
Pulse Gen Serial Number: 7372545

## 2019-01-02 LAB — MAGNESIUM: Magnesium: 1.7 mg/dL (ref 1.7–2.4)

## 2019-01-02 LAB — BASIC METABOLIC PANEL
Anion gap: 11 (ref 5–15)
BUN: 17 mg/dL (ref 8–23)
CO2: 32 mmol/L (ref 22–32)
Calcium: 8.3 mg/dL — ABNORMAL LOW (ref 8.9–10.3)
Chloride: 94 mmol/L — ABNORMAL LOW (ref 98–111)
Creatinine, Ser: 0.88 mg/dL (ref 0.61–1.24)
GFR calc Af Amer: >60 mL/min (ref >60)
GFR calc non Af Amer: >60 mL/min (ref >60)
Glucose, Bld: 139 mg/dL — ABNORMAL HIGH (ref 70–99)
Potassium: 3.9 mmol/L (ref 3.5–5.1)
Sodium: 137 mmol/L (ref 135–145)

## 2019-01-02 LAB — GLUCOSE, CAPILLARY
Glucose-Capillary: 114 mg/dL — ABNORMAL HIGH (ref 70–99)
Glucose-Capillary: 123 mg/dL — ABNORMAL HIGH (ref 70–99)
Glucose-Capillary: 121 mg/dL — ABNORMAL HIGH (ref 70–99)
Glucose-Capillary: 110 mg/dL — ABNORMAL HIGH (ref 70–99)

## 2019-01-02 LAB — CBC
HCT: 34.2 % — ABNORMAL LOW (ref 39.0–52.0)
Hemoglobin: 10.9 g/dL — ABNORMAL LOW (ref 13.0–17.0)
MCH: 26.6 pg (ref 26.0–34.0)
MCHC: 31.9 g/dL (ref 30.0–36.0)
MCV: 83.4 fL (ref 80.0–100.0)
Platelets: 224 10*3/uL (ref 150–400)
RBC: 4.1 MIL/uL — ABNORMAL LOW (ref 4.22–5.81)
RDW: 15.7 % — AB (ref 11.5–15.5)
WBC: 6.4 10*3/uL (ref 4.0–10.5)
nRBC: 0 % (ref 0.0–0.2)

## 2019-01-02 MED ORDER — GUAIFENESIN-DM 100-10 MG/5ML PO SYRP
5.0000 mL | ORAL_SOLUTION | ORAL | Status: DC | PRN
  Administered 2019-01-02: 5 mL via ORAL
  Filled 2019-01-02: qty 5

## 2019-01-02 MED ORDER — FUROSEMIDE 80 MG PO TABS
80.0000 mg | ORAL_TABLET | Freq: Two times a day (BID) | ORAL | Status: DC
  Administered 2019-01-02 – 2019-01-03 (×2): 80 mg via ORAL
  Filled 2019-01-02 (×2): qty 1

## 2019-01-02 MED ORDER — FUROSEMIDE 10 MG/ML IJ SOLN
20.0000 mg | Freq: Once | INTRAMUSCULAR | Status: AC
  Administered 2019-01-02: 20 mg via INTRAVENOUS
  Filled 2019-01-02: qty 2

## 2019-01-02 MED ORDER — POTASSIUM CHLORIDE CRYS ER 20 MEQ PO TBCR
20.0000 meq | EXTENDED_RELEASE_TABLET | Freq: Every day | ORAL | Status: DC
  Administered 2019-01-02 – 2019-01-03 (×2): 20 meq via ORAL
  Filled 2019-01-02: qty 1

## 2019-01-02 NOTE — Progress Notes (Signed)
Occupational Therapy Treatment Patient Details Name: Jeffery Nelson MRN: [id number] DOB: 1946/01/25 Today's Date: 01/02/2019    History of present illness Pt is a 73 y.o. male admitted 12/25/18 with SOB and weight gain. Found to have new onset A-fib and RLE cellulitis. Of note, recent RUE fx on 12/24; conservative management in sling. PMH includes THA (2015), DM, HTN, CHF, defibrillator, syncope, venous stasis BLEs.   OT comments  Pt is a 73 yo male  Up in recliner for therapy. Light grooming with set-upA in chair, pt continues to feel less confident and less safe in standing so grooming performed in sitting. RUE sling re-adjusted, light elbow, wrist and hand ROM performed with cueing for proper technique. Pt tolerating sit to stand with MinA and CGA for initial standing balance. Pt progressing well. Pt continues to be limited by decreased mobility, strength and endurance requiring additional OT services for ADL, mobility and safety in SNF setting.   Follow Up Recommendations  SNF;Supervision - Intermittent    Equipment Recommendations  3 in 1 bedside commode    Recommendations for Other Services      Precautions / Restrictions Precautions Precautions: Fall Precaution Comments: watch HR, A-fib Required Braces or Orthoses: Sling Restrictions Weight Bearing Restrictions: No RUE Weight Bearing: Non weight bearing Other Position/Activity Restrictions: per pt from orthopedic MD       Mobility Bed Mobility Overal bed mobility: Needs Assistance             General bed mobility comments: Received sitting in recliner  Transfers Overall transfer level: Needs assistance Equipment used: Rolling walker (2 wheeled) Transfers: Sit to/from Stand Sit to Stand: Min guard;Min assist         General transfer comment: Initially stood from recliner to RW with close min guard for balance. Second trial requiring minA for trunk elevation. Heavy reliance on LUE support and momentum to power  into standing    Balance Overall balance assessment: Needs assistance;History of Falls Sitting-balance support: Feet supported;Single extremity supported Sitting balance-Leahy Scale: Fair Sitting balance - Comments: Reliant on UE support to sit upright at edge of recliner   Standing balance support: Single extremity supported Standing balance-Leahy Scale: Fair Standing balance comment: Reliant on LUE support for standing balance                           ADL either performed or assessed with clinical judgement   ADL Overall ADL's : Needs assistance/impaired     Grooming: Brushing hair;Set up                               Functional mobility during ADLs: Min guard;Rolling walker(chair following) General ADL Comments: requires minA for UB and maxA for LB ADL     Vision   Vision Assessment?: No apparent visual deficits   Perception     Praxis      Cognition Arousal/Alertness: Awake/alert Behavior During Therapy: WFL for tasks assessed/performed Overall Cognitive Status: Within Functional Limits for tasks assessed                                 General Comments: WFL for simple tasks. Tangential with speech/some decreased attention, but able to be redirected; likely baseline cognition        Exercises Exercises: Other exercises Other Exercises Other Exercises: RUE elbow, wrist and  hand exercises ROM   Shoulder Instructions       General Comments ADjusted sling as pt's elbow needed to be moved to allow for wrist to be supported.    Pertinent Vitals/ Pain       Pain Assessment: Faces Faces Pain Scale: Hurts a little bit Pain Location: BLEs  Home Living                                          Prior Functioning/Environment              Frequency  Min 2X/week        Progress Toward Goals  OT Goals(current goals can now be found in the care plan section)  Progress towards OT goals: Progressing  toward goals  Acute Rehab OT Goals Patient Stated Goal: to go to rehab to get stronger OT Goal Formulation: With patient Time For Goal Achievement: 01/18/19 Potential to Achieve Goals: Good  Plan Discharge plan remains appropriate    Co-evaluation                 AM-PAC OT "6 Clicks" Daily Activity     Outcome Measure   Help from another person eating meals?: A Little Help from another person taking care of personal grooming?: A Little Help from another person toileting, which includes using toliet, bedpan, or urinal?: A Lot Help from another person bathing (including washing, rinsing, drying)?: A Lot Help from another person to put on and taking off regular upper body clothing?: A Lot Help from another person to put on and taking off regular lower body clothing?: A Lot 6 Click Score: 14    End of Session Equipment Utilized During Treatment: Rolling walker  OT Visit Diagnosis: Unsteadiness on feet (R26.81);Repeated falls (R29.6);Muscle weakness (generalized) (M62.81)   Activity Tolerance Patient tolerated treatment well;Patient limited by pain   Patient Left in chair;with chair alarm set;with call bell/phone within reach   Nurse Communication Mobility status        Time: 1356-1410 OT Time Calculation (min): 14 min  Charges: OT General Charges $OT Visit: 1 Visit OT Treatments $Self Care/Home Management : 8-22 mins  Darryl Nestle) Marsa Aris OTR/L Acute Rehabilitation Services Pager: [phone] Office: [phone]  Fredda Hammed 01/02/2019, 3:08 PM

## 2019-01-02 NOTE — Progress Notes (Signed)
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  He reports he has has chronic bilateral lower extremity with erythema for a long time.  Denied any fevers and chills.  He was found to be in atrial fibrillation which appears to be new diagnosis for him. Getting diuresed well with IV lasix.  During hospitalization about 25 L of fluid was removed without affecting his renal function rarely.  He symptomatically felt much better.  Physical therapy recommended skilled nursing facility.   Assessment & Plan:   Principal Problem:   Atrial fibrillation with rapid ventricular response (HCC) Active Problems:   RLS (restless legs syndrome)   Type 2 diabetes mellitus (HCC)   DOE (dyspnea on exertion)   Cellulitis   Hyponatremia   Chronic anemia   Humerus fracture   Chronic pain   HLD (hyperlipidemia)   Atrial fibrillation with RVR (HCC)  Acute respiratory distress secondary to CHF exacerbation Acute combined systolic and diastolic congestive heart failure, class II, ejection fraction 20% status post AICD -Rate controlled on Coreg. -Transition to Lasix 80 mg twice daily orally.  Appreciate cardiology input -Resume Entresto - Chest x-ray 1/12- pulmonary edema. -Closely monitor electrolytes and replete as necessary.  Bilateral lower extremity chronic swelling and erythema -No evidence of infection at the moment.  Slightly erythematous.  No antibiotics needed.  Persistent atrial fibrillation, RVR is resolved. -Heart rate is well controlled on Coreg 6.25 mg twice daily -Cont Eliquis 5mg  BID -Echocardiogram : showed ejection fraction 20-25%,  mild LVH, mild MR, severely dilated left atrium, moderately dilated right atrium, mild MR.  Echo in June 2019 had similar findings  Physical deconditioning secondary to morbid obesity and multiple medical comorbidities -Physical therapy recommends skilled nursing facility  Anemia of chronic disease -Around baseline of 11.  No obvious signs of bleeding.  Diabetes mellitus type 2 -Hemoglobin A1c 7.6.  Proximal right humerus fracture -Previously seen by orthopedic outpatient on December 12, 2018-sling in place.  Needs to follow-up outpatient with orthopedic-has an appointment on January 15, 2019  Restless leg syndrome -Continue ropinirole  Hyperlipidemia -Continue Crestor  DVT prophylaxis: Eliquis Code Status: Full Code  Family Communication:  None at bedside Disposition Plan: We should transition her to skilled nursing facility.  Consultants:   Cardiology  Procedures:   None  Antimicrobials:   Vancomycin and Rocephin stopped 1/8   Subjective: Patient reports of feeling some dizziness yesterday and had an episode of mild confusion this morning but had resolved by the time as saw the patient.  Review of Systems Otherwise negative except as per HPI, including: General = no fevers, chills, dizziness, malaise, fatigue HEENT/EYES = negative for pain, redness, loss of vision, double vision, blurred vision, loss of hearing, sore throat, hoarseness, dysphagia Cardiovascular= negative for chest pain, palpitation, murmurs, lower extremity swelling Respiratory/lungs= negative for shortness of breath, cough, hemoptysis, wheezing, mucus production Gastrointestinal= negative for nausea, vomiting,, abdominal pain, melena, hematemesis Genitourinary= negative for Dysuria, Hematuria, Change in Urinary Frequency MSK = Negative for arthralgia, myalgias, Back Pain, Joint swelling  Neurology= Negative for headache, seizures, numbness, tingling  Psychiatry= Negative for anxiety, depression,  suicidal and homocidal ideation Allergy/Immunology= Medication/Food allergy as listed  Skin=  Negative for Rash, lesions, ulcers, itching    Objective: Vitals:   01/02/19 0420 01/02/19 0505 01/02/19 0604 01/02/19 0832  BP: (!) 87/59 99/81 105/60   Pulse: 67 79 72 79  Resp: 14 15 17    Temp: 97.8 F (36.6 C)     TempSrc: Oral     SpO2: 100% 99% 91%   Weight: 129.2 kg     Height:        Intake/Output Summary (Last 24 hours) at 01/02/2019 1055 Last data filed at 01/02/2019 0500 Gross per 24 hour  Intake 240 ml  Output 3200 ml  Net -2960 ml   Filed Weights   12/31/18 0420 01/01/19 0419 01/02/19 0420  Weight: (!) 139.9 kg 130.8 kg 129.2 kg    Examination: Constitutional: NAD, calm, comfortable Eyes: PERRL, lids and conjunctivae normal ENMT: Mucous membranes are moist. Posterior pharynx clear of any exudate or lesions.Normal dentition.  Neck: normal, supple, no masses, no thyromegaly Respiratory: Very mild bibasilar crackles Cardiovascular: Regular rate and rhythm, no murmurs / rubs / gallops.  2+ bilateral lower extremity pitting edema. 2+ pedal pulses. No carotid bruits.  Abdomen: no tenderness, no masses palpated. No hepatosplenomegaly. Bowel sounds positive.  Musculoskeletal: Right upper extremity is in sling Skin: no rashes, lesions, ulcers. No induration Neurologic: CN 2-12 grossly intact. Sensation intact, DTR normal. Strength 4/5 in all 4.  Psychiatric: Normal judgment and insight. Alert and oriented x 3. Normal mood.   Data Reviewed:   CBC: Recent Labs  Lab 12/29/18 0513 12/30/18 0329 12/31/18 0400 01/01/19 0409 01/02/19 0407  WBC 6.7 7.4 6.5 6.8 6.4  HGB 10.7* 10.7* 10.8* 11.3* 10.9*  HCT 33.8* 33.8* 35.0* 36.8* 34.2*  MCV 81.3 82.0 82.7 83.1 83.4  PLT 230 234 207 218 702   Basic Metabolic Panel: Recent Labs  Lab 12/29/18 0513 12/30/18 0329 12/31/18 0400 12/31/18 0811 01/01/19 0409 01/02/19 0407  NA 131* 133*  --  133* 135 137  K 4.1 3.8  --  3.8  3.7 3.9  CL 97* 98  --  98 95* 94*  CO2 24 24  --  27 30 32  GLUCOSE 141* 131*  --  142* 133* 139*  BUN 23 21  --  18 16 17   CREATININE 0.84 0.78  --  0.74 0.79 0.88  CALCIUM 8.0* 8.2*  --  8.1* 8.3* 8.3*  MG 2.1 1.8 1.9  --  1.7 1.7   GFR: Estimated Creatinine Clearance: 93.7 mL/min (by C-G formula based on SCr of 0.88 mg/dL). Liver Function Tests: Recent Labs  Lab 12/28/18 0319 12/29/18 0513 12/30/18 0329  AST 38 38 30  ALT 47* 56* 48*  ALKPHOS 94 99 102  BILITOT 1.0 0.8 0.8  PROT 5.4* 5.6* 5.6*  ALBUMIN 2.6* 2.9* 2.8*   No results for input(s): LIPASE, AMYLASE in the last 168 hours. No results for input(s): AMMONIA in the last 168 hours. Coagulation Profile: No results for input(s): INR, PROTIME in the last 168 hours. Cardiac Enzymes: No results for input(s): CKTOTAL, CKMB, CKMBINDEX, TROPONINI in the last 168 hours. BNP (last 3 results) No results for input(s): PROBNP in the last 8760 hours. HbA1C: No results for input(s): HGBA1C in the last 72 hours. CBG: Recent Labs  Lab 01/01/19 0810 01/01/19 1147 01/01/19 1726 01/01/19 2111 01/02/19 0740  GLUCAP 119* 138* 155* 125* 114*   Lipid Profile: No results for input(s): CHOL, HDL, LDLCALC, TRIG, CHOLHDL, LDLDIRECT in the last 72 hours. Thyroid Function Tests: No results for input(s): TSH,  T4TOTAL, FREET4, T3FREE, THYROIDAB in the last 72 hours. Anemia Panel: No results for input(s): VITAMINB12, FOLATE, FERRITIN, TIBC, IRON, RETICCTPCT in the last 72 hours. Sepsis Labs: No results for input(s): PROCALCITON, LATICACIDVEN in the last 168 hours.  Recent Results (from the past 240 hour(s))  Blood Culture (routine x 2)     Status: None   Collection Time: 12/25/18  7:16 PM  Result Value Ref Range Status   Specimen Description BLOOD LEFT ANTECUBITAL  Final   Special Requests   Final    BOTTLES DRAWN AEROBIC AND ANAEROBIC Blood Culture results may not be optimal due to an excessive volume of blood received in culture  bottles   Culture   Final    NO GROWTH 5 DAYS Performed at [facility], [place] [address], [place], [place] [address]    Report Status 12/30/2018 FINAL  Final  Blood Culture (routine x 2)     Status: None   Collection Time: 12/25/18  7:42 PM  Result Value Ref Range Status   Specimen Description BLOOD LEFT HAND  Final   Special Requests   Final    BOTTLES DRAWN AEROBIC AND ANAEROBIC Blood Culture results may not be optimal due to an inadequate volume of blood received in culture bottles   Culture   Final    NO GROWTH 5 DAYS Performed at [facility], [place] [address], [place], [place] [address]    Report Status 12/30/2018 FINAL  Final         Radiology Studies: Dg Chest Port 1 View  Result Date: 12/31/2018 CLINICAL DATA:  Acute onset of shortness of breath and cough. EXAM: PORTABLE CHEST 1 VIEW COMPARISON:  12/25/2018 and earlier, including CTA chest 12/25/2018. FINDINGS: Markedly suboptimal inspiration. Cardiac silhouette markedly enlarged, unchanged. LEFT subclavian pacemaker lead tip at the expected location of the RV apex, unchanged. Pulmonary venous hypertension and mild interstitial pulmonary edema which is new since the examination 6 days ago. No confluent airspace consolidation. Stable chronic elevation of the RIGHT hemidiaphragm with chronic scar/atelectasis at the RIGHT base IMPRESSION: Suboptimal inspiration. Mild acute CHF, with stable marked cardiomegaly and mild diffuse interstitial pulmonary edema. Electronically Signed   By: Evangeline Dakin M.D.   On: 12/31/2018 20:43        Scheduled Meds: . apixaban  5 mg Oral BID  . calcium-vitamin D  1 tablet Oral QHS  . carvedilol  6.25 mg Oral BID WC  . docusate sodium  100 mg Oral BID  . FLUoxetine  20 mg Oral Daily  . furosemide  80 mg Oral BID  . hydrocerin   Topical BID  . insulin aspart  0-20 Units Subcutaneous TID WC  . potassium chloride  20 mEq Oral Daily  . rOPINIRole  4 mg Oral QHS  . rosuvastatin   20 mg Oral q1800  . sacubitril-valsartan  1 tablet Oral BID   Continuous Infusions:    LOS: 6 days   Time spent= 25 mins    Ellanore Vanhook Arsenio Loader, MD [facility]  If 7PM-7AM, please contact night-coverage [email or web address] 01/02/2019, 10:55 AM

## 2019-01-02 NOTE — Social Work (Signed)
Continue to await [facility] authorization. CSW to follow.  Estanislado Emms, LCSW [phone]

## 2019-01-02 NOTE — Progress Notes (Addendum)
Physical Therapy Treatment Patient Details Name: Jeffery Nelson MRN: [id number] DOB: Jan 21, 1946 Today's Date: 01/02/2019    History of Present Illness Pt is a 73 y.o. male admitted 12/25/18 with SOB and weight gain. Found to have new onset A-fib and RLE cellulitis. Of note, recent RUE fx on 12/24; conservative management in sling. PMH includes THA (2015), DM, HTN, CHF, defibrillator, syncope, venous stasis BLEs.   PT Comments    Pt progressing with mobility. Able to ambulate 50' total with RW and close min guard for balance, requiring 1x prolonged seated rest break secondary to fatigue and SOB; minA to stand. SpO2 88-98% on RA. Pt remains limited by generalized weakness and decreased activity tolerance; at high risk for falls. Continue to recommend SNF-level therapies to maximize functional mobility and independence prior to return home.   Follow Up Recommendations  SNF;Supervision for mobility/OOB     Equipment Recommendations  (TDB next venue)    Recommendations for Other Services       Precautions / Restrictions Precautions Precautions: Fall Required Braces or Orthoses: Sling Restrictions RUE Weight Bearing: Non weight bearing Other Position/Activity Restrictions: per pt from orthopedic MD    Mobility  Bed Mobility               General bed mobility comments: Received sitting in recliner  Transfers Overall transfer level: Needs assistance Equipment used: Rolling walker (2 wheeled) Transfers: Sit to/from Stand Sit to Stand: Min guard;Min assist         General transfer comment: Initially stood from recliner to [place] with close min guard for balance. Second trial requiring minA for trunk elevation. Heavy reliance on LUE support and momentum to power into standing  Ambulation/Gait Ambulation/Gait assistance: Min guard Gait Distance (Feet): 24 Feet(+26) Assistive device: Rolling walker (2 wheeled) Gait Pattern/deviations: Step-to pattern;Wide base of support Gait  velocity: Decreased Gait velocity interpretation: <1.31 ft/sec, indicative of household ambulator General Gait Details: Slow, labored gait with RW and close min guard for balance, good ability to push RW with single UE support; amb 24' + seated rest + 26'. Pt required prolonged seated rest break secondary to SOB and fatigue. DOE 2/4; SpO2 88-98% on RA.   Stairs             Wheelchair Mobility    Modified Rankin (Stroke Patients Only)       Balance Overall balance assessment: Needs assistance;History of Falls Sitting-balance support: Feet supported;Single extremity supported Sitting balance-Leahy Scale: Fair Sitting balance - Comments: Reliant on UE support to sit upright at edge of recliner   Standing balance support: Single extremity supported Standing balance-Leahy Scale: Poor Standing balance comment: Reliant on LUE support for standing balance                            Cognition Arousal/Alertness: Awake/alert Behavior During Therapy: WFL for tasks assessed/performed Overall Cognitive Status: Within Functional Limits for tasks assessed                                 General Comments: WFL for simple tasks. Tangential with speech/some decreased attention, but able to be redirected; likely baseline cognition      Exercises      General Comments General comments (skin integrity, edema, etc.): SpO2 88-98% on RA      Pertinent Vitals/Pain Pain Assessment: No/denies pain    Home Living  Prior Function            PT Goals (current goals can now be found in the care plan section) Acute Rehab PT Goals Patient Stated Goal: to go to rehab to get stronger PT Goal Formulation: With patient Time For Goal Achievement: 01/09/19 Potential to Achieve Goals: Good Progress towards PT goals: Progressing toward goals    Frequency    Min 2X/week      PT Plan Current plan remains appropriate    Co-evaluation               AM-PAC PT "6 Clicks" Mobility   Outcome Measure  Help needed turning from your back to your side while in a flat bed without using bedrails?: A Lot Help needed moving from lying on your back to sitting on the side of a flat bed without using bedrails?: A Lot Help needed moving to and from a bed to a chair (including a wheelchair)?: A Little Help needed standing up from a chair using your arms (e.g., wheelchair or bedside chair)?: A Little Help needed to walk in hospital room?: A Little Help needed climbing 3-5 steps with a railing? : A Lot 6 Click Score: 15    End of Session Equipment Utilized During Treatment: Gait belt Activity Tolerance: Patient tolerated treatment well Patient left: in chair;with call bell/phone within reach;with chair alarm set Nurse Communication: Mobility status PT Visit Diagnosis: Muscle weakness (generalized) (M62.81);Unsteadiness on feet (R26.81);Difficulty in walking, not elsewhere classified (R26.2)     Time: 1610-9604 PT Time Calculation (min) (ACUTE ONLY): 14 min  Charges:  $Gait Training: 8-22 mins                    Mabeline Caras, PT, DPT Acute Rehabilitation Services  Pager [phone] Office [place] 01/02/2019, 2:21 PM

## 2019-01-02 NOTE — Progress Notes (Signed)
Progress Note  Patient Name: Jeffery Nelson Date of Encounter: 01/02/2019  Primary Cardiologist: Pixie Casino, MD   Subjective   Admits to having some confusion earlier today.  Now feeling better.  Shortness of breath is mild and stable, improved significantly from admission.  No chest pain.  Inpatient Medications    Scheduled Meds: . apixaban  5 mg Oral BID  . calcium-vitamin D  1 tablet Oral QHS  . carvedilol  6.25 mg Oral BID WC  . docusate sodium  100 mg Oral BID  . FLUoxetine  20 mg Oral Daily  . furosemide  80 mg Intravenous Q8H  . hydrocerin   Topical BID  . insulin aspart  0-20 Units Subcutaneous TID WC  . rOPINIRole  4 mg Oral QHS  . rosuvastatin  20 mg Oral q1800  . sacubitril-valsartan  1 tablet Oral BID   Continuous Infusions:  PRN Meds: acetaminophen, alum & mag hydroxide-simeth, HYDROcodone-acetaminophen, methocarbamol, polyethylene glycol   Vital Signs    Vitals:   01/02/19 0420 01/02/19 0505 01/02/19 0604 01/02/19 0832  BP: (!) 87/59 99/81 105/60   Pulse: 67 79 72 79  Resp: 14 15 17    Temp: 97.8 F (36.6 C)     TempSrc: Oral     SpO2: 100% 99% 91%   Weight: 129.2 kg     Height:        Intake/Output Summary (Last 24 hours) at 01/02/2019 0937 Last data filed at 01/02/2019 0500 Gross per 24 hour  Intake 240 ml  Output 3200 ml  Net -2960 ml   Last 3 Weights 01/02/2019 01/01/2019 12/31/2018  Weight (lbs) 284 lb 13.4 oz 288 lb 5.8 oz 308 lb 6.8 oz  Weight (kg) 129.2 kg 130.8 kg 139.9 kg      Telemetry    Atrial fibrillation with controlled ventricular response - Personally Reviewed   Physical Exam  Alert, oriented male in no distress.  Chronically ill-appearing, morbidly obese GEN: No acute distress.   Neck: Mild JVD Cardiac:  Irregularly irregular with no murmur or gallop Respiratory: Clear to auscultation bilaterally. GI: Soft, obese, nontender MS:  Chronic stasis dermatitis of both lower extremities, 1+ pretibial edema Neuro:   Nonfocal  Psych: Normal affect   Labs    Chemistry Recent Labs  Lab 12/28/18 0319  12/29/18 0513 12/30/18 0329 12/31/18 0811 01/01/19 0409 01/02/19 0407  NA 130*   < > 131* 133* 133* 135 137  K 4.1   < > 4.1 3.8 3.8 3.7 3.9  CL 99   < > 97* 98 98 95* 94*  CO2 23   < > 24 24 27 30  32  GLUCOSE 117*   < > 141* 131* 142* 133* 139*  BUN 27*   < > 23 21 18 16 17   CREATININE 0.89   < > 0.84 0.78 0.74 0.79 0.88  CALCIUM 7.9*   < > 8.0* 8.2* 8.1* 8.3* 8.3*  PROT 5.4*  --  5.6* 5.6*  --   --   --   ALBUMIN 2.6*  --  2.9* 2.8*  --   --   --   AST 38  --  38 30  --   --   --   ALT 47*  --  56* 48*  --   --   --   ALKPHOS 94  --  99 102  --   --   --   BILITOT 1.0  --  0.8 0.8  --   --   --  GFRNONAA >60   < > >60 >60 >60 >60 >60  GFRAA >60   < > >60 >60 >60 >60 >60  ANIONGAP 8   < > 10 11 8 10 11    < > = values in this interval not displayed.     Hematology Recent Labs  Lab 12/31/18 0400 01/01/19 0409 01/02/19 0407  WBC 6.5 6.8 6.4  RBC 4.23 4.43 4.10*  HGB 10.8* 11.3* 10.9*  HCT 35.0* 36.8* 34.2*  MCV 82.7 83.1 83.4  MCH 25.5* 25.5* 26.6  MCHC 30.9 30.7 31.9  RDW 15.4 15.6* 15.7*  PLT 207 218 224    Cardiac EnzymesNo results for input(s): TROPONINI in the last 168 hours. No results for input(s): TROPIPOC in the last 168 hours.   BNP Recent Labs  Lab 12/28/18 0319  BNP 1,379.5*     DDimer No results for input(s): DDIMER in the last 168 hours.   Radiology    Dg Chest Port 1 View  Result Date: 12/31/2018 CLINICAL DATA:  Acute onset of shortness of breath and cough. EXAM: PORTABLE CHEST 1 VIEW COMPARISON:  12/25/2018 and earlier, including CTA chest 12/25/2018. FINDINGS: Markedly suboptimal inspiration. Cardiac silhouette markedly enlarged, unchanged. LEFT subclavian pacemaker lead tip at the expected location of the RV apex, unchanged. Pulmonary venous hypertension and mild interstitial pulmonary edema which is new since the examination 6 days ago. No confluent  airspace consolidation. Stable chronic elevation of the RIGHT hemidiaphragm with chronic scar/atelectasis at the RIGHT base IMPRESSION: Suboptimal inspiration. Mild acute CHF, with stable marked cardiomegaly and mild diffuse interstitial pulmonary edema. Electronically Signed   By: Evangeline Dakin M.D.   On: 12/31/2018 20:43     Patient Profile     73 y.o. male with severe underlying nonischemic cardiomyopathy and chronic combined systolic and diastolic heart failure presents with broken right arm after mechanical fall, now admitted with marked volume overload and new onset atrial fibrillation.  Assessment & Plan    1.  New onset atrial fibrillation: The patient's heart rate is controlled.  He will continue on carvedilol and apixaban for anticoagulation.  2.  Acute on chronic combined systolic and diastolic heart failure: The patient has diuresed very well.  He admits that he was not taking his Lasix regularly as an outpatient.  I think he is ready to convert over to oral furosemide starting with tomorrow's dose of 80 mg twice daily.  His renal function has remained stable.  He has diuresed off about 25 pounds of fluid. Tolerating coreg and entresto.   Disposition: Plans noted for discharge to skilled nursing for rehab.  Will arrange outpatient cardiology follow-up.  For questions or updates, please contact [place] Please consult [email or web address] for contact info under     Signed, Sherren Mocha, MD  01/02/2019, 9:37 AM

## 2019-01-03 ENCOUNTER — Telehealth: Payer: Self-pay | Admitting: Adult Health

## 2019-01-03 LAB — GLUCOSE, CAPILLARY
Glucose-Capillary: 111 mg/dL — ABNORMAL HIGH (ref 70–99)
Glucose-Capillary: 135 mg/dL — ABNORMAL HIGH (ref 70–99)

## 2019-01-03 LAB — CBC
HCT: 34.1 % — ABNORMAL LOW (ref 39.0–52.0)
Hemoglobin: 10.5 g/dL — ABNORMAL LOW (ref 13.0–17.0)
MCH: 25.5 pg — ABNORMAL LOW (ref 26.0–34.0)
MCHC: 30.8 g/dL (ref 30.0–36.0)
MCV: 83 fL (ref 80.0–100.0)
Platelets: 185 10*3/uL (ref 150–400)
RBC: 4.11 MIL/uL — ABNORMAL LOW (ref 4.22–5.81)
RDW: 15.6 % — ABNORMAL HIGH (ref 11.5–15.5)
WBC: 5.5 10*3/uL (ref 4.0–10.5)
nRBC: 0 % (ref 0.0–0.2)

## 2019-01-03 LAB — BASIC METABOLIC PANEL
Anion gap: 13 (ref 5–15)
BUN: 18 mg/dL (ref 8–23)
CHLORIDE: 92 mmol/L — AB (ref 98–111)
CO2: 33 mmol/L — ABNORMAL HIGH (ref 22–32)
CREATININE: 0.84 mg/dL (ref 0.61–1.24)
Calcium: 8.2 mg/dL — ABNORMAL LOW (ref 8.9–10.3)
GFR calc Af Amer: >60 mL/min (ref >60)
GFR calc non Af Amer: >60 mL/min (ref >60)
Glucose, Bld: 132 mg/dL — ABNORMAL HIGH (ref 70–99)
Potassium: 3.9 mmol/L (ref 3.5–5.1)
Sodium: 138 mmol/L (ref 135–145)

## 2019-01-03 LAB — MAGNESIUM: Magnesium: 1.8 mg/dL (ref 1.7–2.4)

## 2019-01-03 MED ORDER — METHOCARBAMOL 500 MG PO TABS: 500.0000 mg | ORAL_TABLET | Freq: Four times a day (QID) | ORAL | Status: AC | PRN

## 2019-01-03 MED ORDER — CARVEDILOL 6.25 MG PO TABS: 6.2500 mg | ORAL_TABLET | Freq: Two times a day (BID) | ORAL | Status: DC

## 2019-01-03 MED ORDER — FUROSEMIDE 80 MG PO TABS: 80.0000 mg | ORAL_TABLET | Freq: Two times a day (BID) | ORAL | 5 refills | Status: DC

## 2019-01-03 MED ORDER — HYDROCERIN EX CREA: 1.0000 "application " | TOPICAL_CREAM | Freq: Two times a day (BID) | CUTANEOUS | 0 refills | Status: AC

## 2019-01-03 MED ORDER — HYDROCODONE-ACETAMINOPHEN 5-325 MG PO TABS: 1.0000 | ORAL_TABLET | ORAL | 0 refills | Status: AC | PRN

## 2019-01-03 MED ORDER — APIXABAN 5 MG PO TABS: 5.0000 mg | ORAL_TABLET | Freq: Two times a day (BID) | ORAL | Status: DC

## 2019-01-03 MED ORDER — GUAIFENESIN-DM 100-10 MG/5ML PO SYRP: 5.0000 mL | ORAL_SOLUTION | ORAL | 0 refills | Status: AC | PRN

## 2019-01-03 MED ORDER — POTASSIUM CHLORIDE CRYS ER 20 MEQ PO TBCR: 20.0000 meq | EXTENDED_RELEASE_TABLET | Freq: Two times a day (BID) | ORAL | 3 refills | Status: AC

## 2019-01-03 MED ORDER — INSULIN ASPART 100 UNIT/ML ~~LOC~~ SOLN: 0.0000 [IU] | Freq: Three times a day (TID) | SUBCUTANEOUS | 11 refills | Status: AC

## 2019-01-03 NOTE — Clinical Social Work Placement (Signed)
   CLINICAL SOCIAL WORK PLACEMENT  NOTE  Date:  01/03/2019  Patient Details  Name: Jeffery Nelson MRN: [id number] Date of Birth: [birth year]  Clinical Social Work is seeking post-discharge placement for this patient at the [place] level of care (*CSW will initial, date and re-position this form in  chart as items are completed):  Yes   Patient/family provided with [facility] Work Department's list of facilities offering this level of care within the geographic area requested by the patient (or if unable, by the patient's family).  Yes   Patient/family informed of their freedom to choose among providers that offer the needed level of care, that participate in Medicare, Medicaid or managed care program needed by the patient, have an available bed and are willing to accept the patient.  Yes   Patient/family informed of 's ownership interest in [facility] and [facility], as well as of the fact that they are under no obligation to receive care at these facilities.  PASRR submitted to EDS on       PASRR number received on       Existing PASRR number confirmed on 12/27/18     FL2 transmitted to all facilities in geographic area requested by pt/family on 12/27/18     FL2 transmitted to all facilities within larger geographic area on       Patient informed that his/her managed care company has contracts with or will negotiate with certain facilities, including the following:  [facility]         Patient/family informed of bed offers received.  Patient chooses bed at [facility] and patient chooses bed at      Patient to be transferred to [facility] on 01/03/19.  Patient to be transferred to facility by [facility]     Patient family notified on 01/03/19 of transfer.  Name of family member notified:        PHYSICIAN Please prepare priority discharge summary,  including medications, Please prepare prescriptions     Additional Comment:    _______________________________________________ Estanislado Emms, LCSW 01/03/2019, 10:44 AM

## 2019-01-03 NOTE — Care Management Note (Signed)
Case Management Note  Patient Details  Name: Jeffery Nelson MRN: [id number] Date of Birth: [birth year]  Subjective/Objective:   Pt presented for Atrial Fib with RVR- CSW assisting patient with transition to SNF.                 Action/Plan: No further needs identified from CM at this time.   Expected Discharge Date:                  Expected Discharge Plan:  Skilled Nursing Facility  In-House Referral:  Clinical Social Work  Discharge planning Services  CM Consult  Post Acute Care Choice:  NA Choice offered to:  NA  DME Arranged:  N/A DME Agency:  NA  HH Arranged:  NA HH Agency:  NA  Status of Service:  Completed, signed off  If discussed at [place] of Stay Meetings, dates discussed:    Additional Comments:  Bethena Roys, RN 01/03/2019, 11:42 AM

## 2019-01-03 NOTE — Social Work (Signed)
Patient will discharge to [facility] Anticipated discharge date: 01/03/19 Family notified: Jadene Pierini, sister Transportation by: [facility]  Nurse to call report to [phone]. Patient will go to room 303 at the facility.  CSW signing off.  Estanislado Emms, [place]  Clinical Social Worker

## 2019-01-03 NOTE — Social Work (Signed)
Patient has [facility] approval for [facility] SNF today. Updated MD. CSW to follow and support with discharge when patient medically cleared.  Estanislado Emms, LCSW [phone]

## 2019-01-03 NOTE — Progress Notes (Addendum)
Progress Note  Patient Name: Jeffery Nelson Date of Encounter: 01/03/2019  Primary Cardiologist: Pixie Casino, MD   Subjective   Patient reports that his breathing has improved and his lower extremity edema is better, he has some wrinkles in his lower legs.  He had mild intermittent orthopnea during the night, but this is better.  He slept fairly well last night.  He has had no chest discomfort.  He says that he has heard talk of discharge to [facility] rehab today.  Inpatient Medications    Scheduled Meds: . apixaban  5 mg Oral BID  . calcium-vitamin D  1 tablet Oral QHS  . carvedilol  6.25 mg Oral BID WC  . docusate sodium  100 mg Oral BID  . FLUoxetine  20 mg Oral Daily  . furosemide  80 mg Oral BID  . hydrocerin   Topical BID  . insulin aspart  0-20 Units Subcutaneous TID WC  . potassium chloride  20 mEq Oral Daily  . rOPINIRole  4 mg Oral QHS  . rosuvastatin  20 mg Oral q1800  . sacubitril-valsartan  1 tablet Oral BID   Continuous Infusions:  PRN Meds: acetaminophen, alum & mag hydroxide-simeth, guaiFENesin-dextromethorphan, HYDROcodone-acetaminophen, methocarbamol, polyethylene glycol   Vital Signs    Vitals:   01/02/19 1823 01/02/19 2043 01/03/19 0537 01/03/19 0859  BP: 103/78 95/67 (!) 98/59 (!) 91/46  Pulse: 75 88 73 82  Resp:  18 (!) 25 (!) 23  Temp:  97.8 F (36.6 C) 97.9 F (36.6 C)   TempSrc:  Oral Oral   SpO2:  92% 99% 93%  Weight:   124.3 kg   Height:        Intake/Output Summary (Last 24 hours) at 01/03/2019 1125 Last data filed at 01/03/2019 0500 Gross per 24 hour  Intake 100 ml  Output 1750 ml  Net -1650 ml   Last 3 Weights 01/03/2019 01/02/2019 01/01/2019  Weight (lbs) 274 lb 284 lb 13.4 oz 288 lb 5.8 oz  Weight (kg) 124.286 kg 129.2 kg 130.8 kg      Telemetry    A. fib in the 80s with rare NSVT, 5 beats last evening.- Personally Reviewed  ECG    No new tracings- Personally Reviewed  Physical Exam   GEN:  Obese male no acute  distress.   Neck: No JVD Cardiac:  Irregularly irregular rhythm, no murmurs, rubs, or gallops.  Respiratory: Clear to auscultation bilaterally, except for few scattered crackles GI: Soft, nontender, non-distended  MS:  1+ lower leg edema and chronic stasis dermatitis, right arm in a sling Neuro:  Nonfocal  Psych: Normal affect   Labs    Chemistry Recent Labs  Lab 12/28/18 0319  12/29/18 0513 12/30/18 0329  01/01/19 0409 01/02/19 0407 01/03/19 0420  NA 130*   < > 131* 133*   < > 135 137 138  K 4.1   < > 4.1 3.8   < > 3.7 3.9 3.9  CL 99   < > 97* 98   < > 95* 94* 92*  CO2 23   < > 24 24   < > 30 32 33*  GLUCOSE 117*   < > 141* 131*   < > 133* 139* 132*  BUN 27*   < > 23 21   < > 16 17 18   CREATININE 0.89   < > 0.84 0.78   < > 0.79 0.88 0.84  CALCIUM 7.9*   < > 8.0* 8.2*   < >  8.3* 8.3* 8.2*  PROT 5.4*  --  5.6* 5.6*  --   --   --   --   ALBUMIN 2.6*  --  2.9* 2.8*  --   --   --   --   AST 38  --  38 30  --   --   --   --   ALT 47*  --  56* 48*  --   --   --   --   ALKPHOS 94  --  99 102  --   --   --   --   BILITOT 1.0  --  0.8 0.8  --   --   --   --   GFRNONAA >60   < > >60 >60   < > >60 >60 >60  GFRAA >60   < > >60 >60   < > >60 >60 >60  ANIONGAP 8   < > 10 11   < > 10 11 13    < > = values in this interval not displayed.     Hematology Recent Labs  Lab 01/01/19 0409 01/02/19 0407 01/03/19 0420  WBC 6.8 6.4 5.5  RBC 4.43 4.10* 4.11*  HGB 11.3* 10.9* 10.5*  HCT 36.8* 34.2* 34.1*  MCV 83.1 83.4 83.0  MCH 25.5* 26.6 25.5*  MCHC 30.7 31.9 30.8  RDW 15.6* 15.7* 15.6*  PLT 218 224 185    Cardiac EnzymesNo results for input(s): TROPONINI in the last 168 hours. No results for input(s): TROPIPOC in the last 168 hours.   BNP Recent Labs  Lab 12/28/18 0319  BNP 1,379.5*     DDimer No results for input(s): DDIMER in the last 168 hours.   Radiology    No results found.  Cardiac Studies   Echocardiogram 12/26/2018 Study Conclusions - Left ventricle: The  cavity size was mildly dilated. Wall   thickness was increased in a pattern of mild LVH. Systolic   function was severely reduced. The estimated ejection fraction   was in the range of 20% to 25%. Wall motion was normal; there   were no regional wall motion abnormalities. The study was not   technically sufficient to allow evaluation of LV diastolic   dysfunction due to atrial fibrillation. - Aortic valve: Trileaflet; mildly thickened, mildly calcified   leaflets. - Mitral valve: Calcified annulus. There was mild regurgitation. - Left atrium: The atrium was severely dilated. - Right atrium: The atrium was moderately dilated. - Tricuspid valve: There was mild regurgitation. - Pulmonary arteries: PA peak pressure: 31 mm Hg (S).  Patient Profile     73 y.o. male with severe underlying nonischemic cardiomyopathy and chronic combined systolic and diastolic heart failure presents with broken right arm after mechanical fall, now admitted with marked volume overload and new onset atrial fibrillation.  Assessment & Plan    1.  New onset atrial fibrillation: Rate is well controlled in the 80s.  On carvedilol for rate control and apixaban for stroke risk reduction.  2.  Acute on chronic combined systolic and diastolic heart failure: The patient has been diuresed well with IV Lasix and switch to oral Lasix 80 mg twice daily today.  His weight is down 35 pounds since admission.  His breathing and lower extremity edema have improved.  His renal function is stable.  Continue carvedilol and Entresto. Patient may be ready for discharge today.  Will discuss with Dr. Angelena Form.  I have made a follow-up TOC appointment in our office.  For questions or updates, please contact [place] Please consult [email or web address] for contact info under        Signed, Daune Perch, NP  01/03/2019, 11:25 AM    I have personally seen and examined this patient. I agree with the assessment and plan as outlined  above.  He is negative 12 liters and down 30-35 lbs since admission. Breathing is better. He is now on po Lasix. He is on Coreg for rate control of atrial fibrillation. He is on Eliquis.   OK to d/c today.   [facility] will sign off.   Medication Recommendations:  Lasix 80 mg po BID Other recommendations (labs, testing, etc):  none Follow up as an outpatient:  Will be arranged by our team in our office  Lauree Chandler 01/03/2019 11:34 AM

## 2019-01-03 NOTE — Discharge Summary (Signed)
Physician Discharge Summary   Patient ID: Jeffery Nelson MRN: [id number] DOB/AGE: 1946-03-08 73 y.o.  Admit date: 12/25/2018 Discharge date: 01/03/2019  Primary Care Physician:  [name], MD   Recommendations for Outpatient Follow-up:  1. Follow up with PCP in 1-2 weeks 2. Please obtain BMP in one week   Home Health: Patient discharging to skilled nursing facility Equipment/Devices:   Discharge Condition: stable CODE STATUS: FULL Diet recommendation: Carb modified diet   Discharge Diagnoses:    . Atrial fibrillation with rapid ventricular response ([place]), persistent . Acute respiratory distress secondary to acute combined systolic and diastolic CHF Acute combined systolic and diastolic CHF, class II, EF 20% Generalized debility Anemia of chronic disease Diabetes mellitus type 2 Proximal right humerus fracture Restless leg syndrome Hyperlipidemia  Consults: Cardiology    Allergies:   Allergies  Allergen Reactions  . Doxycycline Other (See Comments)    RECTAL BLEEDING  . Lipitor [Atorvastatin] Swelling    Ankles swell  . Lyrica [Pregabalin] Swelling    Ankles swell     DISCHARGE MEDICATIONS: Allergies as of 01/03/2019      Reactions   Doxycycline Other (See Comments)   RECTAL BLEEDING   Lipitor [atorvastatin] Swelling   Ankles swell   Lyrica [pregabalin] Swelling   Ankles swell      Medication List    STOP taking these medications   aspirin EC 81 MG tablet   HUMALOG MIX 75/25 (75-25) 100 UNIT/ML Susp injection Generic drug:  insulin lispro protamine-lispro     TAKE these medications   acetaminophen 500 MG tablet Commonly known as:  TYLENOL Take 500-1,000 mg by mouth every 8 (eight) hours as needed for mild pain.   apixaban 5 MG Tabs tablet Commonly known as:  ELIQUIS Take 1 tablet (5 mg total) by mouth 2 (two) times daily.   CAL MAG ZINC +D3 Tabs Take 1 tablet by mouth at bedtime.   carvedilol 6.25 MG tablet Commonly known as:   COREG Take 1 tablet (6.25 mg total) by mouth 2 (two) times daily with a meal. What changed:    medication strength  how much to take   docusate sodium 100 MG capsule Commonly known as:  COLACE Take 1 capsule (100 mg total) by mouth every 12 (twelve) hours.   FLUoxetine 20 MG tablet Commonly known as:  PROZAC Take 20 mg by mouth daily.   furosemide 80 MG tablet Commonly known as:  LASIX Take 1 tablet (80 mg total) by mouth 2 (two) times daily. What changed:  when to take this   guaiFENesin-dextromethorphan 100-10 MG/5ML syrup Commonly known as:  ROBITUSSIN DM Take 5 mLs by mouth every 4 (four) hours as needed for cough.   hydrocerin Crea Apply 1 application topically 2 (two) times daily. Apply to bilateral LEs and feet (not between toes) twice daily after soap and water cleanse and pat dry.   HYDROcodone-acetaminophen 5-325 MG tablet Commonly known as:  NORCO/VICODIN Take 1 tablet by mouth every 4 (four) hours as needed for moderate pain.   insulin aspart 100 UNIT/ML injection Commonly known as:  novoLOG Inject 0-15 Units into the skin 3 (three) times daily with meals. Sliding scale  CBG 70 - 120: 0 units: CBG 121 - 150: 2 units; CBG 151 - 200: 3 units; CBG 201 - 250: 5 units; CBG 251 - 300: 8 units;CBG 301 - 350: 11 units; CBG 351 - 400: 15 units; CBG > 400 : 15 units and notify MD   methocarbamol  500 MG tablet Commonly known as:  ROBAXIN Take 1 tablet (500 mg total) by mouth every 6 (six) hours as needed for muscle spasms.   potassium chloride SA 20 MEQ tablet Commonly known as:  K-DUR,KLOR-CON Take 1 tablet (20 mEq total) by mouth 2 (two) times daily. What changed:  how much to take   rOPINIRole 4 MG tablet Commonly known as:  REQUIP Take 4 mg by mouth at bedtime.   rosuvastatin 20 MG tablet Commonly known as:  CRESTOR Take 1 tablet (20 mg total) by mouth daily.   sacubitril-valsartan 49-51 MG Commonly known as:  ENTRESTO Take 1 tablet by mouth 2 (two) times  daily.        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  He reports he has has chronic bilateral lower extremity with erythema for a long time.  Denied any fevers and chills.  He was found to be in atrial fibrillation which appears to be new diagnosis for him. Getting diuresed well with IV lasix.  During hospitalization about 25 L of fluid was removed without affecting his renal function rarely.  He symptomatically felt much better.  Physical therapy recommended skilled nursing facility.  Hospital Course:     Atrial fibrillation with rapid ventricular response (HCC), persistent -Currently RVR resolved, heart rate well controlled on Coreg 6.25 mg twice daily 2D echo showed EF of 20 to 25% with mild LVH, mild MR, severely dilated left atrium, moderately dilated right atrium, mild MR.  Similar findings an echo done in June 2019 Continue Eliquis 5 mg twice a day  Acute respiratory failure secondary to CHF exacerbation Acute combined systolic and diastolic CHF, class II, EF 20% status post AICD -Currently rate controlled on Coreg, respiratory status improved -Cardiology was consulted, patient was placed on IV diuresis now transition to Lasix 80 mg twice daily -Continue Entresto, outpatient follow-up with cardiology O2 sats 91% on room air  Generalized debility secondary to multiple medical comorbidities and obesity PT evaluation recommended skilled nursing facility BMI 45, recommended diet and weight control  Anemia of chronic disease Baseline H&H around 11, no obvious signs of bleeding, stable.  H&H 10.5 at the time of discharge  Diabetes mellitus type 2 -  hemoglobin  A1c 7.6, continue insulin regimen  Restless leg syndrome Continue ropinirole  Proximal right humerus fracture Previously seen by orthopedics outpatient on December 12, 2018, sling in place, needs to follow-up outpatient, has appointment on 01/15/2019  Hyperlipidemia Continue Crestor   Day of Discharge S: No acute complaints, hoping to be discharged today  BP (!) 91/46   Pulse 82   Temp 97.9 F (36.6 C) (Oral)   Resp (!) 23   Ht 5\' 5"  (1.651 m)   Wt 124.3 kg   SpO2 93%   BMI 45.60 kg/m   Physical Exam: General: Alert and awake oriented x3 not in any acute distress. HEENT: anicteric sclera, pupils reactive to light and accommodation CVS: S1-S2 clear no murmur rubs or gallops Chest: clear to auscultation bilaterally, no wheezing rales or rhonchi Abdomen: soft nontender, nondistended, normal bowel sounds Extremities: no cyanosis, clubbing or edema noted bilaterally Neuro: Cranial nerves II-XII intact, no focal neurological deficits   The results of  significant diagnostics from this hospitalization (including imaging, microbiology, ancillary and laboratory) are listed below for reference.      Procedures/Studies:  Study Conclusions  - Left ventricle: The cavity size was mildly dilated. Wall   thickness was increased in a pattern of mild LVH. Systolic   function was severely reduced. The estimated ejection fraction   was in the range of 20% to 25%. Wall motion was normal; there   were no regional wall motion abnormalities. The study was not   technically sufficient to allow evaluation of LV diastolic   dysfunction due to atrial fibrillation. - Aortic valve: Trileaflet; mildly thickened, mildly calcified   leaflets. - Mitral valve: Calcified annulus. There was mild regurgitation. - Left atrium: The atrium was severely dilated. - Right atrium: The atrium was moderately dilated. - Tricuspid valve: There was mild regurgitation. - Pulmonary arteries: PA peak pressure: 31  mm Hg (S).   Dg Chest 2 View  Result Date: 12/25/2018 CLINICAL DATA:  Shortness of breath and fluid retention. EXAM: CHEST - 2 VIEW COMPARISON:  Chest x-ray dated June 27, 2018. FINDINGS: Unchanged left chest wall pacemaker. Stable cardiomegaly. Normal pulmonary vascularity. Low lung volumes. No focal consolidation, pleural effusion, or pneumothorax. No acute osseous abnormality. IMPRESSION: 1. Stable cardiomegaly.  No active cardiopulmonary disease. Electronically Signed   By: Titus Dubin M.D.   On: 12/25/2018 16:05   Dg Shoulder Right  Result Date: 12/12/2018 CLINICAL DATA:  Initial evaluation for acute trauma, fall. EXAM: RIGHT SHOULDER - 2+ VIEW COMPARISON:  Prior MRI from 10/14/2010. FINDINGS: Acute oblique fracture extends through the surgical neck of the right humerus. Associated mild superior subluxation of the humeral shaft relative to the humeral head. The humeral head remains grossly approximated with the glenoid. Glenoid itself grossly intact. AC joint remains approximated and intact. Visualized soft tissues demonstrate no acute finding. Visualized right hemithorax clear. IMPRESSION: Acute mildly displaced oblique fracture through the neck of the right humerus. Electronically Signed   By: Jeannine Boga M.D.   On: 12/12/2018 19:52   Ct Angio Chest Pe W/cm &/or Wo Cm  Result Date: 12/25/2018 CLINICAL DATA:  Bilateral leg swelling with shortness of breath EXAM: CT ANGIOGRAPHY CHEST WITH CONTRAST TECHNIQUE: Multidetector CT imaging of the chest was performed using the standard protocol during bolus administration of intravenous contrast. Multiplanar CT image reconstructions and MIPs were obtained to evaluate the vascular anatomy. CONTRAST:  94 mL ISOVUE-370 IOPAMIDOL (ISOVUE-370) INJECTION 76% COMPARISON:  Chest radiograph 12/25/2018, CT chest 08/01/2010 FINDINGS: Cardiovascular: Motion degradation limits evaluation for acute distal PE. No acute filling defects within the central or  proximal segmental vessels to suggest acute embolus. Mild cardiomegaly. No significant pericardial effusion. Nonaneurysmal aorta. Coronary vascular calcification. Mediastinum/Nodes: Midline trachea. No thyroid mass. No significant adenopathy. Small hiatal hernia Lungs/Pleura: Lungs are clear. No pleural effusion or pneumothorax. Upper Abdomen: No acute abnormality. Musculoskeletal: Degenerative changes. No acute or suspicious abnormality Review of the MIP images confirms the above findings. IMPRESSION: 1. Limited evaluation for distal emboli due to motion degradation. No acute central PE is seen. 2. Mild cardiomegaly 3. Clear lung fields Electronically Signed   By: Donavan Foil M.D.   On: 12/25/2018 22:53   Dg Chest Port 1 View  Result Date: 12/31/2018 CLINICAL DATA:  Acute onset of shortness of breath and cough. EXAM: PORTABLE CHEST 1 VIEW COMPARISON:  12/25/2018 and earlier, including CTA chest 12/25/2018. FINDINGS: Markedly suboptimal inspiration. Cardiac silhouette markedly enlarged, unchanged. LEFT subclavian pacemaker lead tip at the expected location  of the RV apex, unchanged. Pulmonary venous hypertension and mild interstitial pulmonary edema which is new since the examination 6 days ago. No confluent airspace consolidation. Stable chronic elevation of the RIGHT hemidiaphragm with chronic scar/atelectasis at the RIGHT base IMPRESSION: Suboptimal inspiration. Mild acute CHF, with stable marked cardiomegaly and mild diffuse interstitial pulmonary edema. Electronically Signed   By: Evangeline Dakin M.D.   On: 12/31/2018 20:43   Dg Humerus Right  Result Date: 12/12/2018 CLINICAL DATA:  Initial evaluation for acute trauma, fall. EXAM: RIGHT HUMERUS - 2+ VIEW COMPARISON:  Concomitant radiograph of the right shoulder. FINDINGS: Acute oblique fracture extends through the neck of the right humerus with slight superior subluxation/displacement. Finding better evaluated on concomitant radiograph of the right  shoulder. Note also made of a thin linear density adjacent to the lateral margin of the mid right humeral shaft, suggestive of periosteal lifting. Finding of uncertain etiology, and could be related to subperiosteal hematoma related to the humeral neck fracture. Possible avulsive injury at the deltoid insertion could also be considered. No aggressive osseous lesions or discernible soft tissue mass seen at this location. No other acute fracture or dislocation about the right humerus. Limited views of the elbow grossly unremarkable. Soft tissue induration and swelling noted at the upper arm. IMPRESSION: 1. Acute oblique fracture through the neck of the right humerus, better evaluated on concomitant radiograph of the right shoulder. 2. Thin linear density adjacent to the lateral margin of the mid right humeral shaft, most consistent with periosteal lifting. Finding of uncertain etiology, and could be secondary to subperiosteal hematoma related to the humeral neck fracture. Possible avulsive injury at the deltoid insertion could also be considered. Attention at follow-up recommended. Electronically Signed   By: Jeannine Boga M.D.   On: 12/12/2018 20:05       LAB RESULTS: Basic Metabolic Panel: Recent Labs  Lab 01/02/19 0407 01/03/19 0420  NA 137 138  K 3.9 3.9  CL 94* 92*  CO2 32 33*  GLUCOSE 139* 132*  BUN 17 18  CREATININE 0.88 0.84  CALCIUM 8.3* 8.2*  MG 1.7 1.8   Liver Function Tests: Recent Labs  Lab 12/29/18 0513 12/30/18 0329  AST 38 30  ALT 56* 48*  ALKPHOS 99 102  BILITOT 0.8 0.8  PROT 5.6* 5.6*  ALBUMIN 2.9* 2.8*   No results for input(s): LIPASE, AMYLASE in the last 168 hours. No results for input(s): AMMONIA in the last 168 hours. CBC: Recent Labs  Lab 01/02/19 0407 01/03/19 0420  WBC 6.4 5.5  HGB 10.9* 10.5*  HCT 34.2* 34.1*  MCV 83.4 83.0  PLT 224 185   Cardiac Enzymes: No results for input(s): CKTOTAL, CKMB, CKMBINDEX, TROPONINI in the last 168  hours. BNP: Invalid input(s): POCBNP CBG: Recent Labs  Lab 01/02/19 2202 01/03/19 0719  GLUCAP 110* 111*      Disposition and Follow-up:    DISPOSITION: [place] information for follow-up providers    [name], MD. Schedule an appointment as soon as possible for a visit in 2 week(s).   Specialty:  Family Medicine Contact information: [address] [place] [address] [phone]        Pixie Casino, MD Follow up.   Specialty:  Cardiology Why:  Cardiology hospital follow-up on 01/10/2019 at 9:30 AM with Beckie Busing, PA.  Contact information: [address] [place] [address] [phone]  Contact information for after-discharge care    Destination    [facility] .   Service:  Skilled Nursing Contact information: [address] [place] [phone]                   Time coordinating discharge:  35 minutes  Signed:   Estill Cotta M.D. [facility] 01/03/2019, 11:33 AM Pager: [phone]

## 2019-01-03 NOTE — Care Management Important Message (Signed)
Important Message  Patient Details  Name: Jeffery Nelson MRN: [id number] Date of Birth: 15-May-1946   Medicare Important Message Given:  Yes    Barb Merino Lanise Mergen 01/03/2019, 3:11 PM

## 2019-01-03 NOTE — Telephone Encounter (Signed)
New Message   TOC appt per Wallis Bamberg PA

## 2019-01-04 ENCOUNTER — Non-Acute Institutional Stay (SKILLED_NURSING_FACILITY): Payer: Medicare Other | Admitting: Internal Medicine

## 2019-01-04 ENCOUNTER — Encounter: Payer: Self-pay | Admitting: Internal Medicine

## 2019-01-04 DIAGNOSIS — I5043 Acute on chronic combined systolic (congestive) and diastolic (congestive) heart failure: Secondary | ICD-10-CM | POA: Diagnosis not present

## 2019-01-04 DIAGNOSIS — S42214D Unspecified nondisplaced fracture of surgical neck of right humerus, subsequent encounter for fracture with routine healing: Secondary | ICD-10-CM

## 2019-01-04 DIAGNOSIS — I4891 Unspecified atrial fibrillation: Secondary | ICD-10-CM | POA: Diagnosis not present

## 2019-01-04 DIAGNOSIS — Z0189 Encounter for other specified special examinations: Secondary | ICD-10-CM

## 2019-01-04 DIAGNOSIS — Z9189 Other specified personal risk factors, not elsewhere classified: Secondary | ICD-10-CM

## 2019-01-04 DIAGNOSIS — Z794 Long term (current) use of insulin: Secondary | ICD-10-CM

## 2019-01-04 DIAGNOSIS — E118 Type 2 diabetes mellitus with unspecified complications: Secondary | ICD-10-CM | POA: Diagnosis not present

## 2019-01-04 NOTE — Assessment & Plan Note (Addendum)
An  A1c of 8% or less  is the safest goal for him; it is critical to prevent hypoglycemia.  Current  A1c 7.6% Single glucose to date at SNF is 131

## 2019-01-04 NOTE — Assessment & Plan Note (Signed)
Slow rate atrial fibrillation, continue with apixaban as tolerated.  

## 2019-01-04 NOTE — Assessment & Plan Note (Addendum)
Cardiologist, Dr. Debara Pickett is to schedule a definitive study Pathophysiology and risk associated with untreated sleep apnea discussed.  Surprisingly the patient was aware that this could include stroke. [place] list medications should be weaned or preferably eliminated as much as possible.

## 2019-01-04 NOTE — Assessment & Plan Note (Addendum)
Cardiology follow-up 01/10/2019 9:30 AM with Beckie Busing, PA Patient aware

## 2019-01-04 NOTE — Progress Notes (Signed)
NURSING HOME LOCATION:  [facility] ROOM NUMBER:  [id number]  CODE STATUS:  Full Code  PCP:  [name], MD  [address] [place] [address]  This is a comprehensive admission note to [facility] performed on this date less than 30 days from date of admission. Included are preadmission medical/surgical history; reconciled medication list; family history; social history and comprehensive review of systems.  Corrections and additions to the records were documented. Comprehensive physical exam was also performed. Additionally a clinical summary was entered for each active diagnosis pertinent to this admission in the Problem List to enhance continuity of care.  HPI: Patient was hospitalized 1/6-1/15/2020 with A. fib with rapid ventricular response, persistent.  Additionally had acute respiratory distress secondary to acute combined systolic and diastolic congestive heart failure, class II with an ejection fraction of 20% on echo. He presented with progressive dyspnea especially supine and recent weight gain.  Bilateral lower extremity edema and erythema had been a chronic issue.  New onset A. fib with rapid ventricular response was diagnosed.  He was diuresed aggressively with IV Lasix.  During the hospitalization approximately 25 L of fluid was removed with symptomatic improvement.  Jeffery Nelson was continued, he was transitioned to Lasix 80 mg twice daily. In addition to the dramatically reduced ejection fraction, echo revealed mild LVH, mild mitral regurgitation, and severe dilation of the left atrium and moderate dilation of the right atrium.  Eliquis 5 mg twice a day was continued. Because of debilitation, PT/OT at an SNF was recommended.  Past medical and surgical history: Includes anemia of chronic disease, insulin controlled diabetes, restless leg syndrome, "probable" sleep apnea, diabetic polyneuropathy and dyslipidemia.  Diagnoses also include "psychosexual dysfunction  with inhibited sexual excitement".  Also there is a description of medication noncompliance. He sustained a proximal right humeral fracture in a mechanical fall 12/12/18; sling is in place.  Orthopedic follow-up is 01/15/2019. S/P AICD.  He has had a left hip fracture repair.  Additionally he has had fixation kyphoplasty of the lumbar spine.  Social history: Nondrinker; never smoker.  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  Family history: Extensive history reviewed.  Family history is striking for heart disease.   Review of systems: Active symptoms include snoring without documented apnea.  He has itchy eyes but no other extrinsic symptoms.  He describes occasional dyspepsia and occasional dysphagia.  He also has occasional nocturnal dyspnea when supine.He has chronic BLE shin skin changes related to chronic edema & stasis. He denies any other symptoms.  Constitutional: No fever, significant weight change, fatigue  Eyes: No redness, discharge, pain, vision change ENT/mouth: No nasal congestion, purulent discharge, earache, change in hearing, sore throat  Cardiovascular: No chest pain, palpitations, claudication  Respiratory: No cough, sputum production, hemoptysis Gastrointestinal: No abdominal pain, nausea /vomiting, rectal bleeding, melena, change in bowels Genitourinary: No dysuria, hematuria, pyuria, incontinence, nocturia Musculoskeletal: No joint stiffness, joint swelling, weakness, pain Neurologic: No dizziness, headache, syncope, seizures, numbness, tingling Psychiatric: No significant anxiety, depression, insomnia, anorexia Endocrine: No new change in hair/skin/nails, excessive thirst, excessive hunger, excessive urination  Hematologic/lymphatic: No significant bruising, lymphadenopathy, abnormal bleeding Allergy/immunology: No significant sneezing, urticaria, angioedema  Physical exam:  Pertinent or positive findings: He is morbidly  obese.  Hair is thin & fine. He has multiple missing maxillary and mandibular teeth.  He shows some erosions to the gumline and below.  He is Geneticist, molecular.  The right upper extremity is in a sling.  He has diffuse low-grade dry rales.  Heart rhythm and rate are slightly irregular.  There is an intermittent flow murmur.  Abdomen is incredibly massive..  When I initially entered the room a Foley catheter was a being removed.  He has multiple very large, colorful tattoos.  He states he has 7-8 tattoos in total.  There is a bruise over the epigastrium from his prior fall.Pedal pulses are absent.  He has thickened icthyoid changes over the right shin with hyperpigmentation of the shins.  There is exfoliation of the right lower extremity.  He has tense edema of the lower extremities, right greater than the left.  General appearance:  no acute distress, increased work of breathing is present.   Lymphatic: No lymphadenopathy about the head, neck, axilla. Eyes: No conjunctival inflammation or lid edema is present. There is no scleral icterus. Ears:  External ear exam shows no significant lesions or deformities.   Nose:  External nasal examination shows no deformity or inflammation. Nasal mucosa are pink and moist without lesions, exudates Oral exam: Lips and gums are healthy appearing.There is no oropharyngeal erythema or exudate. Neck:  No thyromegaly, masses, tenderness noted.    Heart:  No gallop,  click, rub.  Lungs:  without wheezes, rhonchi, rubs. Abdomen: Bowel sounds are normal.No organomegaly, hernias, masses. GU: Deferred  Extremities:  No cyanosis, clubbing. Neurologic exam:  Balance, Rhomberg, finger to nose testing could not be completed due to clinical state Skin: Warm & dry w/o tenting.   See clinical summary under each active problem in the Problem List with associated updated therapeutic plan'

## 2019-01-04 NOTE — Patient Instructions (Signed)
See assessment and plan under each diagnosis in the problem list and acutely for this visit 

## 2019-01-04 NOTE — Assessment & Plan Note (Addendum)
Orthopedic follow-up 01/15/2019 with Dr Stann Mainland Opiates should be weaned as quickly as possible because of high risk of respiratory suppression and death with his multiple, advanced comorbidities

## 2019-01-05 ENCOUNTER — Encounter: Payer: Self-pay | Admitting: Internal Medicine

## 2019-01-05 DIAGNOSIS — Z9189 Other specified personal risk factors, not elsewhere classified: Secondary | ICD-10-CM | POA: Insufficient documentation

## 2019-01-05 NOTE — Telephone Encounter (Signed)
Per the pts chart his was discharged to a nursing facility:  NURSING HOME LOCATION:  [facility] ROOM NUMBER:  303-A  No TCM call needed.

## 2019-01-05 NOTE — Assessment & Plan Note (Addendum)
Muscle relaxant D/Ced Acetaminophen dose adjusted Recommendations discussed with patient

## 2019-01-09 NOTE — Progress Notes (Signed)
Cardiology Office Note   Date:  01/10/2019   ID:  Reason, Helzer Apr 18, 1946, MRN [id number]  PCP:  [name], MD  Cardiologist:  Debara Pickett  EP: Lovena Le  Chief Complaint  Patient presents with  . Hospitalization Follow-up  . Atrial Fibrillation  . Cardiomyopathy     History of Present Illness: Jeffery Nelson is a 73 y.o. male who presents for post hospital follow up after admission for acute on chronic diastolic CHF and new onset atrial fib with RVR, bilateral cellulitis.   He has a history of morbid obesity, medical non-compliance, NICM with combined systolic and diastolic CHF, non obstructive CAD by cath in 2017, VT with [employer] AICD in situ, HTN, HL, Type II Diabetes, and presumed OSA.   He was diuresed 12 liters (down 35 lbs) since admission. He was placed on Eliquis and coreg for rate control. He is currently a resident of [place] center.   He had fallen prior to recent hospitalization and injured his right shoulder. He is due to see [facility] on 01/15/2019.  Past Medical History:  Diagnosis Date  . Acute on chronic systolic congestive heart failure ([place]) 06/21/2016  . AICD (automatic cardioverter/defibrillator) present    a. 06/21/2016 SJM single lead AICD (ser # [id number]).  . Benign neoplasm of colon   . Candida rash of groin 02/12/2016  . Cardiomyopathy ([place]) 02/04/2016  . Cellulitis of right leg 02/17/2018  . Chest pain    From rib fractures  . CHF (congestive heart failure) ([place]) 02/17/2018  . Chronic combined systolic and diastolic CHF (congestive heart failure) ([place])    a. 01/2016 Echo: EF 20-25%, diff HK; b. 05/2016 Echo: EF 35-40%, diff HK, gr1 DD, diff HK, mild MR, mod dil LA/RA, mod TR, PASP 37mmHg.  [name] Chronic combined systolic and diastolic CHF, NYHA class 2 ([place])    a. 01/2016 Echo: EF 20-25%, diff HK; b. 05/2016 Echo: EF 35-40%, diff HK, gr1 DD, diff HK, mild MR, mod dil LA/RA, mod TR, PASP 39mmHg.  . Diabetes mellitus type 2, controlled, with  complications ([place])   . Edema 10/08/2014  . Encounter for long-term (current) use of medications 04/01/2017  . Essential hypertension   . Gout   . Hip fracture ([place]) 09/21/2014  . History of shingles 10/04/2013  . Hx of medication noncompliance 08/23/2017  . Hypertensive heart disease with heart failure ([place])   . Intertrochanteric fx-closed ([place]) 10/04/2014  . Lower extremity edema   . Morbid obesity ([place]) 10/04/2013  . Needs sleep apnea assessment 10/04/2013   Snoring, obesity, daytime somnolence    . NICM (nonischemic cardiomyopathy) ([place])     01/2016 Echo: EF 20-25%, b. 01/2016 Cath: LM nl, LAD min irregs, D1 50ost, LCX min irregs, RCA min irregs, RPDA/RPL1 nl;  c. 05/2016 Echo: EF 35-40%;  d. 06/2016 s/p SJM single lead AICD (ser # [id number])..  . Non-ischemic cardiomyopathy ([place]) 06/03/2016  . Non-obstructive CAD    a. 01/2016 Cath: LM nl, LAD min irregs, D1 50ost, LCX min irregs, RCA min irregs, RPDA/RPL1 nl.  . Physical deconditioning 02/28/2018  . Polyneuropathy in diabetes(357.2)   . Psychosexual dysfunction with inhibited sexual excitement   . Pure hypercholesterolemia   . Restless legs syndrome (RLS)   . 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 Shortness of breath 01/18/2016  . Sleep apnea    "probably" (06/21/2016)  . Sleep arousal disorder   . Swelling of lower extremity 01/18/2016  .  Syncope    a. Presumed to be 2/2 VT in setting of NICM-->s/p single lead SJM AICD in 06/2016.  [name] Syncope and collapse 06/03/2016  . Type II diabetes mellitus ([place])   . Unspecified venous (peripheral) insufficiency   . Varicose veins 10/04/2013  . Venous stasis dermatitis of both lower extremities 02/17/2018    Past Surgical History:  Procedure Laterality Date  . CARDIAC CATHETERIZATION N/A 02/12/2016   Procedure: Right/Left Heart Cath and Coronary Angiography;  Surgeon: Pixie Casino, MD;  Location: [facility];  Service: Cardiovascular;  Laterality: N/A;  . CATARACT EXTRACTION W/  INTRAOCULAR LENS  IMPLANT, BILATERAL Bilateral 2009  . EP IMPLANTABLE DEVICE N/A 06/21/2016   Procedure: ICD Implant;  Surgeon: Evans Lance, MD;  Location: [facility];  Service: Cardiovascular;  Laterality: N/A;  . FIXATION KYPHOPLASTY LUMBAR SPINE  2008  . INTRAMEDULLARY (IM) NAIL INTERTROCHANTERIC Left 09/21/2014   Procedure: left hip intertroch fracture;  Surgeon: [name], MD;  Location: [facility];  Service: Orthopedics;  Laterality: Left;  . LOWER EXTREMITY VENOUS DOPPLER  09/04/2012   Mild valvular insufficiency in the R Common Femoral vein w/ 1.6 sec of duration of refliux  . TRANSTHORACIC ECHOCARDIOGRAM  09/04/2012   EF >55%, normal     Current Outpatient Medications  Medication Sig Dispense Refill  . acetaminophen (TYLENOL) 500 MG tablet Take 500-1,000 mg by mouth every 8 (eight) hours as needed for mild pain.    [name] apixaban (ELIQUIS) 5 MG TABS tablet Take 1 tablet (5 mg total) by mouth 2 (two) times daily. 60 tablet 6  . carvedilol (COREG) 6.25 MG tablet Take 1 tablet (6.25 mg total) by mouth 2 (two) times daily with a meal. 60 tablet 6  . docusate sodium (COLACE) 100 MG capsule Take 1 capsule (100 mg total) by mouth every 12 (twelve) hours. 60 capsule 0  . FLUoxetine (PROZAC) 20 MG tablet Take 20 mg by mouth daily.    . furosemide (LASIX) 80 MG tablet Take 1 tablet (80 mg total) by mouth 2 (two) times daily. 60 tablet 6  . guaiFENesin-dextromethorphan (ROBITUSSIN DM) 100-10 MG/5ML syrup Take 5 mLs by mouth every 4 (four) hours as needed for cough. 118 mL 0  . hydrocerin (EUCERIN) CREA Apply 1 application topically 2 (two) times daily. Apply to bilateral LEs and feet (not between toes) twice daily after soap and water cleanse and pat dry.  0  . HYDROcodone-acetaminophen (NORCO/VICODIN) 5-325 MG tablet Take 1 tablet by mouth every 4 (four) hours as needed for moderate pain. 15 tablet 0  . insulin aspart (NOVOLOG) 100 UNIT/ML injection Inject 0-15 Units into the skin 3 (three)  times daily with meals. Sliding scale  CBG 70 - 120: 0 units: CBG 121 - 150: 2 units; CBG 151 - 200: 3 units; CBG 201 - 250: 5 units; CBG 251 - 300: 8 units;CBG 301 - 350: 11 units; CBG 351 - 400: 15 units; CBG > 400 : 15 units and notify MD 10 mL 11  . methocarbamol (ROBAXIN) 500 MG tablet Take 1 tablet (500 mg total) by mouth every 6 (six) hours as needed for muscle spasms.    . Multiple Minerals-Vitamins (CAL MAG ZINC +D3) TABS Take 1 tablet by mouth at bedtime.     . potassium chloride SA (K-DUR,KLOR-CON) 20 MEQ tablet Take 1 tablet (20 mEq total) by mouth 2 (two) times daily. 180 tablet 3  . rOPINIRole (REQUIP) 4 MG tablet Take 4 mg by mouth  at bedtime.     . rosuvastatin (CRESTOR) 20 MG tablet Take 1 tablet (20 mg total) by mouth daily. 30 tablet 6  . sacubitril-valsartan (ENTRESTO) 49-51 MG Take 1 tablet by mouth 2 (two) times daily. 60 tablet 6   No current facility-administered medications for this visit.     Allergies:   Doxycycline; Lipitor [atorvastatin]; and Lyrica [pregabalin]    Social History:  The patient  reports that he has never smoked. He has never used smokeless tobacco. He reports that he does not drink alcohol or use drugs.   Family History:  The patient's family history includes Cancer in his paternal grandfather; Diabetes in his brother; Heart disease in his father, maternal grandfather, maternal grandmother, and mother; Stroke in his paternal grandmother.    ROS: All other systems are reviewed and negative. Unless otherwise mentioned in H&P    PHYSICAL EXAM: VS:  BP 96/64   Pulse 74   Ht 5\' 4"  (1.626 m)   Wt 257 lb (116.6 kg)   BMI 44.11 kg/m  , BMI Body mass index is 44.11 kg/m. GEN: Well nourished, well developed, in no acute distress, severely morbidly obese. HEENT: normal Neck: no JVD, carotid bruits, or masses Cardiac: IRRR; no murmurs, rubs, or gallops,no edema  Respiratory:  Clear to auscultation bilaterally, normal work of breathing GI: soft,  nontender, nondistended, + BS MS: no deformity or atrophy.Significant bilateral dependent edema with mild lymphedema on the right and venous stasis skin thickening noted. Skin is very scaly. He is also wearing sling to right arm.  Skin: warm and dry, no rash Neuro:  Strength and sensation are intact Psych: euthymic mood, full affect   EKG:  Not completed this office visit.   Recent Labs: 12/30/2018: TSH 1.867 12/28/2018: B Natriuretic Peptide 1,379.5 12/30/2018: ALT 48 01/03/2019: BUN 18; Creatinine, Ser 0.84; Hemoglobin 10.5; Magnesium 1.8; Platelets 185; Potassium 3.9; Sodium 138    Lipid Panel    Component Value Date/Time   CHOL 80 12/28/2018 0319   TRIG 59 12/28/2018 0319   HDL 28 (L) 12/28/2018 0319   CHOLHDL 2.9 12/28/2018 0319   VLDL 12 12/28/2018 0319   LDLCALC 40 12/28/2018 0319      Wt Readings from Last 3 Encounters:  01/10/19 257 lb (116.6 kg)  01/04/19 274 lb (124.3 kg)  01/03/19 274 lb (124.3 kg)    Other studies Reviewed: Echocardiogram 30-Dec-2018  Left ventricle: The cavity size was mildly dilated. Wall   thickness was increased in a pattern of mild LVH. Systolic   function was severely reduced. The estimated ejection fraction   was in the range of 20% to 25%. Wall motion was normal; there   were no regional wall motion abnormalities. The study was not   technically sufficient to allow evaluation of LV diastolic   dysfunction due to atrial fibrillation. - Aortic valve: Trileaflet; mildly thickened, mildly calcified   leaflets. - Mitral valve: Calcified annulus. There was mild regurgitation. - Left atrium: The atrium was severely dilated. - Right atrium: The atrium was moderately dilated. - Tricuspid valve: There was mild regurgitation. - Pulmonary arteries: PA peak pressure: 31 mm Hg (S).   ASSESSMENT AND PLAN:  1.  HFrEF: He was diuresed during recent hospitalization >30 lbs. He had been medically non-compliant with his diuretics. He is now a resident of  [facility] facility and is getting his medications as directed. He is getting weighed regularly and has lost 7 more lbs since discharge. He is supposed to be  discharged home in the next week or two.   I have made sure he has refills on all of his medications to prevent delay in having medications when he does go home. He assures me that he will take his diuretic as directed. He has also bought a lift chair to help him to be able to get up to urinate.   I will have close follow up appointment made as he will be leaving the reahab facility, to ascertain how he does as home. HHN is recommended and should be handled by the Rehab center physician.   2. NICM: EF of 20% per echo this recent admission. He is on Entresto, coreg, lasix 80 mg BID. I am checking BMET for evaluation of kidney function today. He will remain on low sodium diet.   3. Atrial fib: Rate is controlled on coreg. He remains on Eliquis for anticoagulation.   4. Type II diabetes: He will follow up with PCP for ongoing management.  5. Injured right shoulder/arm: He is to see to see orthopedic physician on 01/15/2019.   6. Hypercholesterolemia: Continue Crestor.   Current medicines are reviewed at length with the patient today.    Labs/ tests ordered today include: BMET, CBC.  Phill Myron. West Pugh, ANP, [facility]   01/10/2019 10:33 AM    [facility] [address] Office [phone] Fax [phone]

## 2019-01-10 ENCOUNTER — Telehealth: Payer: Self-pay

## 2019-01-10 ENCOUNTER — Encounter: Payer: Self-pay | Admitting: Adult Health

## 2019-01-10 ENCOUNTER — Ambulatory Visit: Payer: Medicare Other | Admitting: Adult Health

## 2019-01-10 VITALS — BP 96/64 | HR 74 | Ht 64.0 in | Wt 257.0 lb

## 2019-01-10 DIAGNOSIS — Z79899 Other long term (current) drug therapy: Secondary | ICD-10-CM

## 2019-01-10 DIAGNOSIS — D649 Anemia, unspecified: Secondary | ICD-10-CM

## 2019-01-10 MED ORDER — FUROSEMIDE 80 MG PO TABS: 80.0000 mg | ORAL_TABLET | Freq: Two times a day (BID) | ORAL | 6 refills | Status: AC

## 2019-01-10 MED ORDER — ROSUVASTATIN CALCIUM 20 MG PO TABS: 20.0000 mg | ORAL_TABLET | Freq: Every day | ORAL | 6 refills | Status: AC

## 2019-01-10 MED ORDER — SACUBITRIL-VALSARTAN 49-51 MG PO TABS: 1.0000 | ORAL_TABLET | Freq: Two times a day (BID) | ORAL | 6 refills | Status: AC

## 2019-01-10 MED ORDER — CARVEDILOL 6.25 MG PO TABS: 6.2500 mg | ORAL_TABLET | Freq: Two times a day (BID) | ORAL | 6 refills | Status: AC

## 2019-01-10 MED ORDER — APIXABAN 5 MG PO TABS: 5.0000 mg | ORAL_TABLET | Freq: Two times a day (BID) | ORAL | 6 refills | Status: AC

## 2019-01-10 NOTE — Telephone Encounter (Signed)
I reschedule the February appointment for March 11, 2019

## 2019-01-10 NOTE — Patient Instructions (Signed)
Medication Instructions:  NO CHANGES- Your physician recommends that you continue on your current medications as directed. Please refer to the Current Medication list given to you today. If you need a refill on your cardiac medications before your next appointment, please call your pharmacy.  Labwork: BMET AND CBC TODAY HERE IN OUR OFFICE AT [facility] Take the provided lab slips with you to the lab for your blood draw.   When you have your labs (blood work) drawn today and your tests are completely normal, you will receive your results only by MyChart Message (if you have MyChart) -OR-  A paper copy in the mail.  If you have any lab test that is abnormal or we need to change your treatment, we will call you to review these results.  Special Instructions: WHEN YOU RETURN HOME TAKE YOU WEIGHT DAILY IN THE AM  Follow-Up: You will need a follow up appointment in 1 months.  You may see Pixie Casino, MD Jory Sims, DNP, AACC or one of the following Advanced Practice Providers on your designated Care Team:  Almyra Deforest, [place]   Fabian Sharp, PA-C   At [facility], you and your health needs are our priority.  As part of our continuing mission to provide you with exceptional heart care, we have created designated Provider Care Teams.  These Care Teams include your primary Cardiologist (physician) and Advanced Practice Providers (APPs -  Physician Assistants and Nurse Practitioners) who all work together to provide you with the care you need, when you need it.  Thank you for choosing [facility] at [facility]!!

## 2019-01-10 NOTE — Telephone Encounter (Signed)
Pt called stating he is hospitalized and will be doing rehab at [facility]. He will not be able to send a manual transmission. The patient asked me to cancel his upcoming appointments. I advised him as soon as he get home to his monitor to send a manual transmission and we will get him back on schedule for his home remote checks.

## 2019-01-11 LAB — BASIC METABOLIC PANEL
BUN/Creatinine Ratio: 25 — ABNORMAL HIGH (ref 10–24)
BUN: 21 mg/dL (ref 8–27)
CHLORIDE: 96 mmol/L (ref 96–106)
CO2: 27 mmol/L (ref 20–29)
Calcium: 9.1 mg/dL (ref 8.6–10.2)
Creatinine, Ser: 0.83 mg/dL (ref 0.76–1.27)
GFR calc Af Amer: 101 mL/min/{1.73_m2} (ref >59)
GFR calc non Af Amer: 87 mL/min/{1.73_m2} (ref >59)
Glucose: 126 mg/dL — ABNORMAL HIGH (ref 65–99)
Potassium: 4.2 mmol/L (ref 3.5–5.2)
Sodium: 143 mmol/L (ref 134–144)

## 2019-01-11 LAB — CBC
Hematocrit: 41 % (ref 37.5–51.0)
Hemoglobin: 13 g/dL (ref 13.0–17.7)
MCH: 26.3 pg — ABNORMAL LOW (ref 26.6–33.0)
MCHC: 31.7 g/dL (ref 31.5–35.7)
MCV: 83 fL (ref 79–97)
Platelets: 169 10*3/uL (ref 150–450)
RBC: 4.95 x10E6/uL (ref 4.14–5.80)
RDW: 15.1 % (ref 11.6–15.4)
WBC: 5.3 10*3/uL (ref 3.4–10.8)

## 2019-01-25 ENCOUNTER — Telehealth: Payer: Self-pay | Admitting: Internal Medicine

## 2019-01-25 NOTE — Telephone Encounter (Signed)
Pt was discharged to home yesterday and is needing home nursing orders re pitting edema and pt education with meds  Will forward to Dr Debara Pickett to see if will follow pt's care at home .Adonis Housekeeper

## 2019-01-25 NOTE — Telephone Encounter (Signed)
[place]:    Please call Melissa at [facility]. She wants to give you an update of pt's conditions and will also needs some orders.[name]

## 2019-01-27 NOTE — Telephone Encounter (Signed)
Ok to approve requested home nursing orders.  Dr. Lemmie Evens

## 2019-01-29 NOTE — Telephone Encounter (Signed)
Jeffery Nelson aware Dr Debara Pickett will do home nursing orders ./cy

## 2019-02-02 ENCOUNTER — Telehealth: Payer: Self-pay

## 2019-02-02 ENCOUNTER — Ambulatory Visit (INDEPENDENT_AMBULATORY_CARE_PROVIDER_SITE_OTHER): Payer: Medicare Other

## 2019-02-02 DIAGNOSIS — I5042 Chronic combined systolic (congestive) and diastolic (congestive) heart failure: Secondary | ICD-10-CM | POA: Diagnosis not present

## 2019-02-02 DIAGNOSIS — Z95810 Presence of automatic (implantable) cardiac defibrillator: Secondary | ICD-10-CM | POA: Diagnosis not present

## 2019-02-02 NOTE — Progress Notes (Signed)
EPIC Encounter for ICM Monitoring  Patient Name: Jeffery Nelson is a 73 y.o. male Date: 02/02/2019 Primary Care Physican: [name], MD Primary Cardiologist:Hilty Electrophysiologist:Taylor Last Weight:270lbs Today's Weight: unknown      Attempted call to patient and unable to reach.  Transmission reviewed.   Patient was hospitalized followed by inpatient rehab with discharge in the last 2 days     Report: Thoracic impedance normal.   Prescribed: Furosemide40 mg take 2 tablets (80 mg total) twice a day. Potassium 20 mEq 1 tablet daily.  Recommendations: Unable to reach.  Follow-up plan: ICM clinic phone appointment on 02/16/2019 to recheck fluid levels.   Office appt 02/19/2019 with Dr. Debara Pickett.    Copy of ICM check sent to Dr. Lovena Le.   3 month ICM trend: 02/02/2019    1 Year ICM trend:       Rosalene Billings, RN 02/02/2019 3:43 PM

## 2019-02-02 NOTE — Telephone Encounter (Signed)
Remote ICM transmission received.  Attempted call to patient regarding ICM remote transmission and no message.  °

## 2019-02-16 ENCOUNTER — Telehealth: Payer: Self-pay

## 2019-02-16 ENCOUNTER — Ambulatory Visit (INDEPENDENT_AMBULATORY_CARE_PROVIDER_SITE_OTHER): Payer: Medicare Other

## 2019-02-16 DIAGNOSIS — Z95810 Presence of automatic (implantable) cardiac defibrillator: Secondary | ICD-10-CM

## 2019-02-16 DIAGNOSIS — I5042 Chronic combined systolic (congestive) and diastolic (congestive) heart failure: Secondary | ICD-10-CM

## 2019-02-16 NOTE — Telephone Encounter (Signed)
Remote ICM transmission received.  Attempted call to patient regarding ICM remote transmission and no answer or answering machine. 

## 2019-02-16 NOTE — Progress Notes (Signed)
EPIC Encounter for ICM Monitoring  Patient Name: Jeffery Nelson is a 73 y.o. male Date: 02/16/2019 Primary Care Physican: [name], MD Primary Cardiologist:Hilty Electrophysiologist:Taylor Last Weight:270lbs Today's Weight: unknown                                                      Attempted call to patient and unable to reach.  Transmission reviewed.   Message sent to device clinic triage to review SVT/NSVT episodes.    Report: Thoracic impedance returned to normal since 2/14 last remote transmission.    Prescribed: Furosemide40 mg take 2 tablets (80 mg total) twice a day. Potassium 20 mEq 1 tablet daily.  Recommendations: Unable to reach.  Follow-up plan: ICM clinic phone appointment on 03/05/2019.   Office appt 02/19/2019 with Dr. Debara Pickett.    Copy of ICM check sent to Dr. Lovena Le and Dr Debara Pickett since patient has office visit 3/2.   3 month ICM trend: 02/16/2019    1 Year ICM trend:       Rosalene Billings, RN 02/16/2019 9:55 AM

## 2019-02-19 ENCOUNTER — Encounter: Payer: Self-pay | Admitting: Internal Medicine

## 2019-02-19 ENCOUNTER — Ambulatory Visit: Payer: Medicare Other | Admitting: Internal Medicine

## 2019-02-19 VITALS — BP 147/70 | HR 105 | Ht 64.0 in | Wt 279.0 lb

## 2019-02-19 DIAGNOSIS — Z95810 Presence of automatic (implantable) cardiac defibrillator: Secondary | ICD-10-CM | POA: Diagnosis not present

## 2019-02-19 DIAGNOSIS — I5023 Acute on chronic systolic (congestive) heart failure: Secondary | ICD-10-CM | POA: Diagnosis not present

## 2019-02-19 DIAGNOSIS — Z79899 Other long term (current) drug therapy: Secondary | ICD-10-CM

## 2019-02-19 DIAGNOSIS — I428 Other cardiomyopathies: Secondary | ICD-10-CM

## 2019-02-19 MED ORDER — METOLAZONE 2.5 MG PO TABS: ORAL_TABLET | ORAL | 0 refills | Status: AC

## 2019-02-19 NOTE — Patient Instructions (Addendum)
Medication Instructions:  Start: Metolazone 2.5 mg every Monday, Wednesday, Friday for two weeks.  If you need a refill on your cardiac medications before your next appointment, please call your pharmacy.   Lab work: Your physician recommends that you return for lab work in 1 week BMP, BNP)  If you have labs (blood work) drawn today and your tests are completely normal, you will receive your results only by: [name] MyChart Message (if you have MyChart) OR . A paper copy in the mail If you have any lab test that is abnormal or we need to change your treatment, we will call you to review the results.  Testing/Procedures: None  Follow-Up: At [facility], you and your health needs are our priority.  As part of our continuing mission to provide you with exceptional heart care, we have created designated Provider Care Teams.  These Care Teams include your primary Cardiologist (physician) and Advanced Practice Providers (APPs -  Physician Assistants and Nurse Practitioners) who all work together to provide you with the care you need, when you need it. You will need a follow up appointment in 6 months.  Please call our office 2 months in advance to schedule this appointment.  You may see Pixie Casino, MD or one of the following Advanced Practice Providers on your designated Care Team: [place], [place] . Fabian Sharp, PA-C  Your physician recommends that you schedule a follow-up appointment in 2-3 week with Leonia Reader, DNP

## 2019-02-19 NOTE — Progress Notes (Signed)
[name]    OFFICE NOTE  Chief Complaint:  Follow-up of heart failure  Primary Care Physician: [name], MD  HPI:  Jeffery Nelson is a 73 year old gentleman referred to me for lower extremity edema and shortness of breath. As you know, he is morbidly obese, almost super morbidly obese with a weight of 330 pounds, BMI of 54. He underwent an echocardiogram which demonstrated mild concentric LVH, EF greater than 55%. The left atrium was mildly dilated. However, there were no significant valvular abnormalities. Pulmonary pressures were not elevated. He also underwent lower extremity Dopplers looking for venous insufficiency. There was a small amount of reflux in the right common femoral vein, which is a deep vein. However, the superficial veins were negative for reflux or treatable venous insufficiency. I reviewed the results with the patient today and I feel that his best option is to continue with lower extremity compression stockings and work on weight loss. He is also at high risk for sleep apnea and I have referred him for a sleep study - unfortunately he never made this appointment.  Currently has no other symptoms other than he related a recent bout of shingles that he underwent. This has since resolved and he was not left with any chronic pain.  I saw Jeffery Nelson back in the office today. Overall he is doing fairly well. He had left hip replacement placement recently due to what sound like a spontaneous fracture. Apparently he has of some degree of osteopenia. He was supposed been medicine for that but is not taking it. Fortunately has had about 30-35 pound weight loss and although he never got his foot split-night sleep study, he denies any new apnea symptoms and feels like he is rested during the day. He has been getting a little more shortness of breath mostly when laying down but that is gotten better and he has no new complaints today.  Jeffery Nelson returns today in the office. He reports that  he's recently had some weight gain and lower extremity swelling. He says it is also has some shortness of breath with exertion. Swelling seems to be a little worse in the right leg than the left. He reports she's been compliant with his current dose of Lasix. He is also taking over-the-counter potassium supplements. He denies any chest pain.  I saw Jeffery Nelson back today in follow-up. He reports he still persistently short of breath. I asked him to increase his Lasix and prescribe some potassium however he said he didn't have the money to get potassium and therefore was nervous about taking extra Lasix so he never increased the dose the medicine. In the interim though he's gained about 6 pounds. He does have lower strandy swelling and persistent shortness of breath. His echocardiogram unfortunate shows a new cardiomyopathy with an EF of 20-25% with global hypokinesis which is severe. Previous echo at least a year ago showed an EF which was normal. He denies any chest pain episodes or recent significant viral illnesses that might explain his reduced LV function. I'm surly concerned about coronary artery disease. He also denies any alcohol or drug use.  Jeffery Nelson returns today for follow-up of his heart catheterization, the results are as follows:   Ost 1st Diag to 1st Diag lesion, 50% stenosed.  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 Moderate pulmonary venous hypertension, reduced cardiac index. Elevated PCWP. Will need additional diuresis  and medication adjustment for non-ischemic cardiomyopathy as an outpatient. I recommended increasing his Lasix to 40 mg twice a day. Since that change she's had significant weight loss. Weight today is 258, which is down from 281. He said no, locations from his radial catheterization site. As mentioned, the plan is to titrate his medical  therapy. He reports his shortness of breath is completely resolved and his leg swelling is improved significantly.  06/03/2016  Jeffery Nelson returns today for follow-up. He has gained about 15 pounds. There is some extra edema but mostly this is weight gain not related to heart failure. However he is not as compliant with twice-daily Lasix. He denies any worsening shortness of breath. He had another episode of syncope which was witnessed. He became apparently pale and unresponsive. By the time EMS arrived he was doing well and declined transport to the hospital. He's had 3 episodes in about 6 months. This was the first episode that was witnessed. I'm concerned about a possible arrhythmia.  09/07/2016  Jeffery Nelson seen today in follow-up. He reports little improvement with increasing his Lasix. Weight is now up to 301 pounds, from 276 lbs approximately 3 months ago. He reports significant lower extremity swelling and weeping edema. 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 He has had no complications from that AICD.  04/01/2017  Jeffery Nelson returns today for follow-up. Again there is been some lack of compliance with medications. He's recently started having some weeping edema again of the right lower extremity. This is been wrapped by his primary care provider. He reports it is only taking Lasix 40 mg twice a day not 80 mg twice a day as previously prescribed. He was also on metolazone but he said he could not afford that as it was not covered. Weight is now back to 301 pounds which it was in September however he did come down significantly to 291 pounds in October.  08/23/2017  Jeffery Nelson seen today in follow-up. Unfortunately he's been off of his medications for about 2 weeks due to cost issues. He says is in the donut hole and had to have his insulin readjusted. This morning he had a hypoglycemic episode. He was aware of and had a  diaphoresis and shakiness. He took some sugar and improved. He says he's not taken his diuretic and his weight is creeping back up to where was a few months ago. When he was previously taken his 80 mg of Lasix twice daily his BMP and lowered to 108 and his weeping edema had improved. Today he has weeping edema again with asymmetric edema the right lower extremity chronic venous stasis changes. Eyes chest pain or worsening shortness of breath. Blood pressure is elevated but again he has not taken his quinapril. He had AICD follow-up with Dr. Lovena Le in July and he felt that everything was normal with the device.  02/28/2018  Jeffery Nelson returns today for follow-up of recent hospitalization for heart failure.  He was hospitalized at [facility] on February 17, 2018 and discharged on February 22, 2018.  His admission weight was 330 pounds however his discharge weight was 276 pounds after aggressive diuresis.  Today he is weighing in at 269 pounds.  He reports his edema has for the most part stayed off.  Most of his prior admission was related to noncompliance with medications.  He said he was helping out some neighbors  and not taking care of himself.  Blood pressure is well controlled today 136/84.  Unfortunately got a viral URI which she says occurred during his hospitalization.  He has had some congestion and cough.  He denies any fever, chills or body aches.  He is able to keep fluids down.  He is receiving some home care from [facility], but reports deconditioning.  He is wondering if he would benefit from some home health services.  05/22/2018  Jeffery Nelson returns for follow-up of his heart failure.  Overall he is doing well.  Weight is been stable.  He denies any worsening shortness of breath.  He is following his weight at home by his insurance company provided him with scales, pulse oximeter and other methods to monitor it.  We have not reassess LV function since 2017.  The time EF was as low as 20 to  25% but recovered to 35 to 40%.  He is due for remote pacer check in July and has an in office check with Dr. Lovena Le in August.  Current medications include carvedilol and benazepril, which she was switched to recently in the hospital.  02/19/2019  Jeffery Nelson seen today for follow-up of heart failure.  He was again hospitalized with anasarca and medication noncompliance.  He was diuresed heavily and discharged to skilled nursing facility.  Ultimately then he returned home.  Since discharge, although he says he has been compliant with his diuretics, weight is gone up significantly from 257 to 279 pounds.  He did have recent check of his ICD which indicated stable volume impedance measurements, however clinically he appears to be in acute on chronic congestive heart failure.  PMHx:  Past Medical History:  Diagnosis Date  . Acute on chronic systolic congestive heart failure ([place]) 06/21/2016  . AICD (automatic cardioverter/defibrillator) present    a. 06/21/2016 SJM single lead AICD (ser # [id number]).  . Benign neoplasm of colon   . Candida rash of groin 02/12/2016  . Cardiomyopathy ([place]) 02/04/2016  . Cellulitis of right leg 02/17/2018  . Chest pain    From rib fractures  . CHF (congestive heart failure) ([place]) 02/17/2018  . Chronic combined systolic and diastolic CHF (congestive heart failure) ([place])    a. 01/2016 Echo: EF 20-25%, diff HK; b. 05/2016 Echo: EF 35-40%, diff HK, gr1 DD, diff HK, mild MR, mod dil LA/RA, mod TR, PASP 110mmHg.  [name] Chronic combined systolic and diastolic CHF, NYHA class 2 ([place])    a. 01/2016 Echo: EF 20-25%, diff HK; b. 05/2016 Echo: EF 35-40%, diff HK, gr1 DD, diff HK, mild MR, mod dil LA/RA, mod TR, PASP 64mmHg.  . Diabetes mellitus type 2, controlled, with complications ([place])   . Edema 10/08/2014  . Encounter for long-term (current) use of medications 04/01/2017  . Essential hypertension   . Gout   . Hip fracture ([place]) 09/21/2014  . History of shingles 10/04/2013  . Hx of medication  noncompliance 08/23/2017  . Hypertensive heart disease with heart failure ([place])   . Intertrochanteric fx-closed ([place]) 10/04/2014  . Lower extremity edema   . Morbid obesity ([place]) 10/04/2013  . Needs sleep apnea assessment 10/04/2013   Snoring, obesity, daytime somnolence    . NICM (nonischemic cardiomyopathy) ([place])     01/2016 Echo: EF 20-25%, b. 01/2016 Cath: LM nl, LAD min irregs, D1 50ost, LCX min irregs, RCA min irregs, RPDA/RPL1 nl;  c. 05/2016 Echo: EF 35-40%;  d. 06/2016 s/p SJM single lead AICD (ser # [id number]).[name]  [name]  Non-ischemic cardiomyopathy ([place]) 06/03/2016  . Non-obstructive CAD    a. 01/2016 Cath: LM nl, LAD min irregs, D1 50ost, LCX min irregs, RCA min irregs, RPDA/RPL1 nl.  . Physical deconditioning 02/28/2018  . Polyneuropathy in diabetes(357.2)   . Psychosexual dysfunction with inhibited sexual excitement   . Pure hypercholesterolemia   . Restless legs syndrome (RLS)   . 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 Shortness of breath 01/18/2016  . Sleep apnea    "probably" (06/21/2016)  . Sleep arousal disorder   . Swelling of lower extremity 01/18/2016  . Syncope    a. Presumed to be 2/2 VT in setting of NICM-->s/p single lead SJM AICD in 06/2016.  [name] Syncope and collapse 06/03/2016  . Type II diabetes mellitus ([place])   . Unspecified venous (peripheral) insufficiency   . Varicose veins 10/04/2013  . Venous stasis dermatitis of both lower extremities 02/17/2018    Past Surgical History:  Procedure Laterality Date  . CARDIAC CATHETERIZATION N/A 02/12/2016   Procedure: Right/Left Heart Cath and Coronary Angiography;  Surgeon: Pixie Casino, MD;  Location: [facility];  Service: Cardiovascular;  Laterality: N/A;  . CATARACT EXTRACTION W/ INTRAOCULAR LENS  IMPLANT, BILATERAL Bilateral 2009  . EP IMPLANTABLE DEVICE N/A 06/21/2016   Procedure: ICD Implant;  Surgeon: Evans Lance, MD;  Location: [facility];  Service: Cardiovascular;  Laterality: N/A;  . FIXATION  KYPHOPLASTY LUMBAR SPINE  2008  . INTRAMEDULLARY (IM) NAIL INTERTROCHANTERIC Left 09/21/2014   Procedure: left hip intertroch fracture;  Surgeon: [name], MD;  Location: [facility];  Service: Orthopedics;  Laterality: Left;  . LOWER EXTREMITY VENOUS DOPPLER  09/04/2012   Mild valvular insufficiency in the R Common Femoral vein w/ 1.6 sec of duration of refliux  . TRANSTHORACIC ECHOCARDIOGRAM  09/04/2012   EF >55%, normal    FAMHx:  Family History  Problem Relation Age of Onset  . Heart disease Mother   . Heart disease Father   . Heart disease Maternal Grandmother   . Heart disease Maternal Grandfather   . Stroke Paternal Grandmother   . Cancer Paternal Grandfather        Skin cancer  . Diabetes Brother     SOCHx:   reports that he has never smoked. He has never used smokeless tobacco. He reports that he does not drink alcohol or use drugs.  ALLERGIES:  Allergies  Allergen Reactions  . Doxycycline Other (See Comments)    RECTAL BLEEDING  . Lipitor [Atorvastatin] Swelling    Ankles swell  . Lyrica [Pregabalin] Swelling    Ankles swell    ROS: Pertinent items noted in HPI and remainder of comprehensive ROS otherwise negative.  HOME MEDS: Current Outpatient Medications  Medication Sig Dispense Refill  . acetaminophen (TYLENOL) 500 MG tablet Take 500-1,000 mg by mouth every 8 (eight) hours as needed for mild pain.    [name] apixaban (ELIQUIS) 5 MG TABS tablet Take 1 tablet (5 mg total) by mouth 2 (two) times daily. 60 tablet 6  . apixaban (ELIQUIS) 5 MG TABS tablet Take 1 tablet by mouth daily.    . carvedilol (COREG) 6.25 MG tablet Take 1 tablet (6.25 mg total) by mouth 2 (two) times daily with a meal. 60 tablet 6  . docusate sodium (COLACE) 100 MG capsule Take 1 capsule (100 mg total) by mouth every 12 (twelve) hours. 60 capsule 0  . FLUoxetine (PROZAC) 20 MG tablet Take 20 mg by mouth  daily.    . furosemide (LASIX) 80 MG tablet Take 1 tablet (80 mg total) by mouth 2 (two) times  daily. 60 tablet 6  . guaiFENesin-dextromethorphan (ROBITUSSIN DM) 100-10 MG/5ML syrup Take 5 mLs by mouth every 4 (four) hours as needed for cough. 118 mL 0  . hydrocerin (EUCERIN) CREA Apply 1 application topically 2 (two) times daily. Apply to bilateral LEs and feet (not between toes) twice daily after soap and water cleanse and pat dry.  0  . HYDROcodone-acetaminophen (NORCO/VICODIN) 5-325 MG tablet Take 1 tablet by mouth every 4 (four) hours as needed for moderate pain. 15 tablet 0  . insulin aspart (NOVOLOG) 100 UNIT/ML injection Inject 0-15 Units into the skin 3 (three) times daily with meals. Sliding scale  CBG 70 - 120: 0 units: CBG 121 - 150: 2 units; CBG 151 - 200: 3 units; CBG 201 - 250: 5 units; CBG 251 - 300: 8 units;CBG 301 - 350: 11 units; CBG 351 - 400: 15 units; CBG > 400 : 15 units and notify MD 10 mL 11  . methocarbamol (ROBAXIN) 500 MG tablet Take 1 tablet (500 mg total) by mouth every 6 (six) hours as needed for muscle spasms.    . Multiple Minerals-Vitamins (CAL MAG ZINC +D3) TABS Take 1 tablet by mouth at bedtime.     . potassium chloride SA (K-DUR,KLOR-CON) 20 MEQ tablet Take 1 tablet (20 mEq total) by mouth 2 (two) times daily. 180 tablet 3  . rOPINIRole (REQUIP) 4 MG tablet Take 4 mg by mouth at bedtime.     . rosuvastatin (CRESTOR) 20 MG tablet Take 1 tablet (20 mg total) by mouth daily. 30 tablet 6  . sacubitril-valsartan (ENTRESTO) 49-51 MG Take 1 tablet by mouth 2 (two) times daily. 60 tablet 6   No current facility-administered medications for this visit.     LABS/IMAGING: No results found for this or any previous visit (from the past 48 hour(s)). No results found.  VITALS: BP (!) 147/70   Pulse (!) 105   Ht 5\' 4"  (1.626 m)   Wt 279 lb (126.6 kg)   SpO2 96%   BMI 47.89 kg/m  General appearance: alert, no distress and morbidly obese Neck: JVD - several cm above sternal notch, no carotid bruit and thyroid not enlarged, symmetric, no  tenderness/mass/nodules Lungs: diminished breath sounds bibasilar Heart: regular rate and rhythm Abdomen: morbidly obese, protuberant, positive fluid wave Extremities: edema 2+ bilateral lower extremity stasis edema with erythema, blisters and venous congestion Pulses: Diminished pulses Skin: Peripheral erythema Woody stasis edema Neurologic: Grossly normal Psych: Pleasant  EKG: Deferred  ASSESSMENT: 1. Acute on chornic systolic congestive heart failure with EF 20-25% (up to 35-40% - 2017) - nonischemic cardiomyopathy with mild CAD 2. Dyspnea, weight gain, leg swelling 3. Morbid obesity 4. Bilateral deep vein reflux with edema, right greater than left 5. Probable obstructive sleep apnea - awaiting sleep study 6. Recent total L hip arthroplasty 7. 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   Jeffery Nelson has had recent admission for acute on chronic systolic heart failure.  He was diuresed and discharged and did well in a skilled nursing facility however at home his weight is gone up about 20 pounds.  He is anasarca today and will need additional diuretics.  I recommended adding metolazone 2.5 mg Monday Wednesday and Friday for the next 2 weeks.  We will repeat a metabolic profile next week and have him see Leonia Reader,  DNP in follow-up.  Follow-up with me in 6 months.   Pixie Casino, MD, [facility], [place] Director of the [facility] Lipid Disorders &  Cardiovascular Risk Reduction Clinic Diplomate of the American Board of Clinical Lipidology Attending Cardiologist  Direct Dial: [phone]  Fax: [phone]  Website:  [email or web address] 02/19/2019, 11:02 AM

## 2019-03-06 ENCOUNTER — Ambulatory Visit: Payer: Medicare Other | Admitting: Adult Health

## 2019-03-14 ENCOUNTER — Other Ambulatory Visit: Payer: Self-pay

## 2019-03-14 ENCOUNTER — Encounter: Payer: Medicare Other | Admitting: *Deleted

## 2019-03-15 ENCOUNTER — Telehealth: Payer: Self-pay

## 2019-03-15 NOTE — Telephone Encounter (Signed)
Left message for patient to remind of missed remote transmission.  

## 2019-03-19 ENCOUNTER — Other Ambulatory Visit: Payer: Self-pay

## 2019-03-19 ENCOUNTER — Encounter: Payer: Self-pay | Admitting: Cardiology

## 2019-03-20 ENCOUNTER — Telehealth: Payer: Self-pay

## 2019-03-20 NOTE — Telephone Encounter (Signed)
Left message for patient to remind of missed remote transmission.  

## 2019-03-26 NOTE — Progress Notes (Signed)
No ICM remote transmission received for 03/19/2019 and next ICM transmission scheduled for 04/09/2019.   

## 2019-04-20 DEATH — deceased

## 2020-04-18 IMAGING — CR DG SHOULDER 2+V*R*
3 series · 3 of 3 positions shown · non-contrast
Comparison: Prior MRI from 10/14/2010.

CLINICAL DATA: Initial evaluation for acute trauma, fall.

EXAM:
RIGHT SHOULDER - 2+ VIEW

[shoulder ap neutral (1 of 2)]
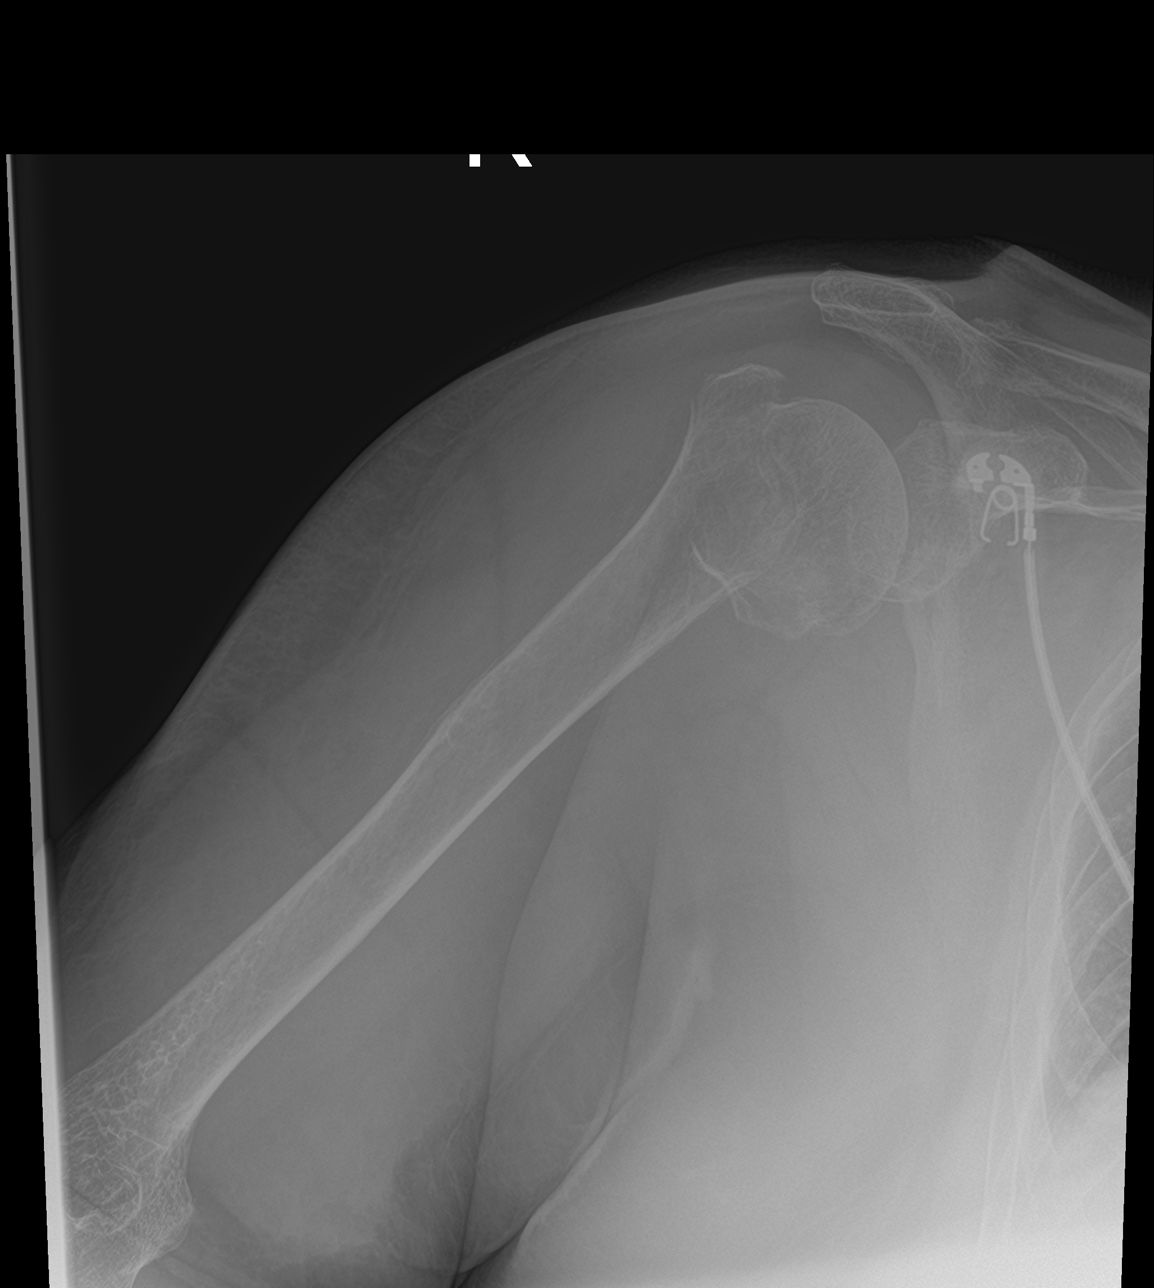

[shoulder y view]
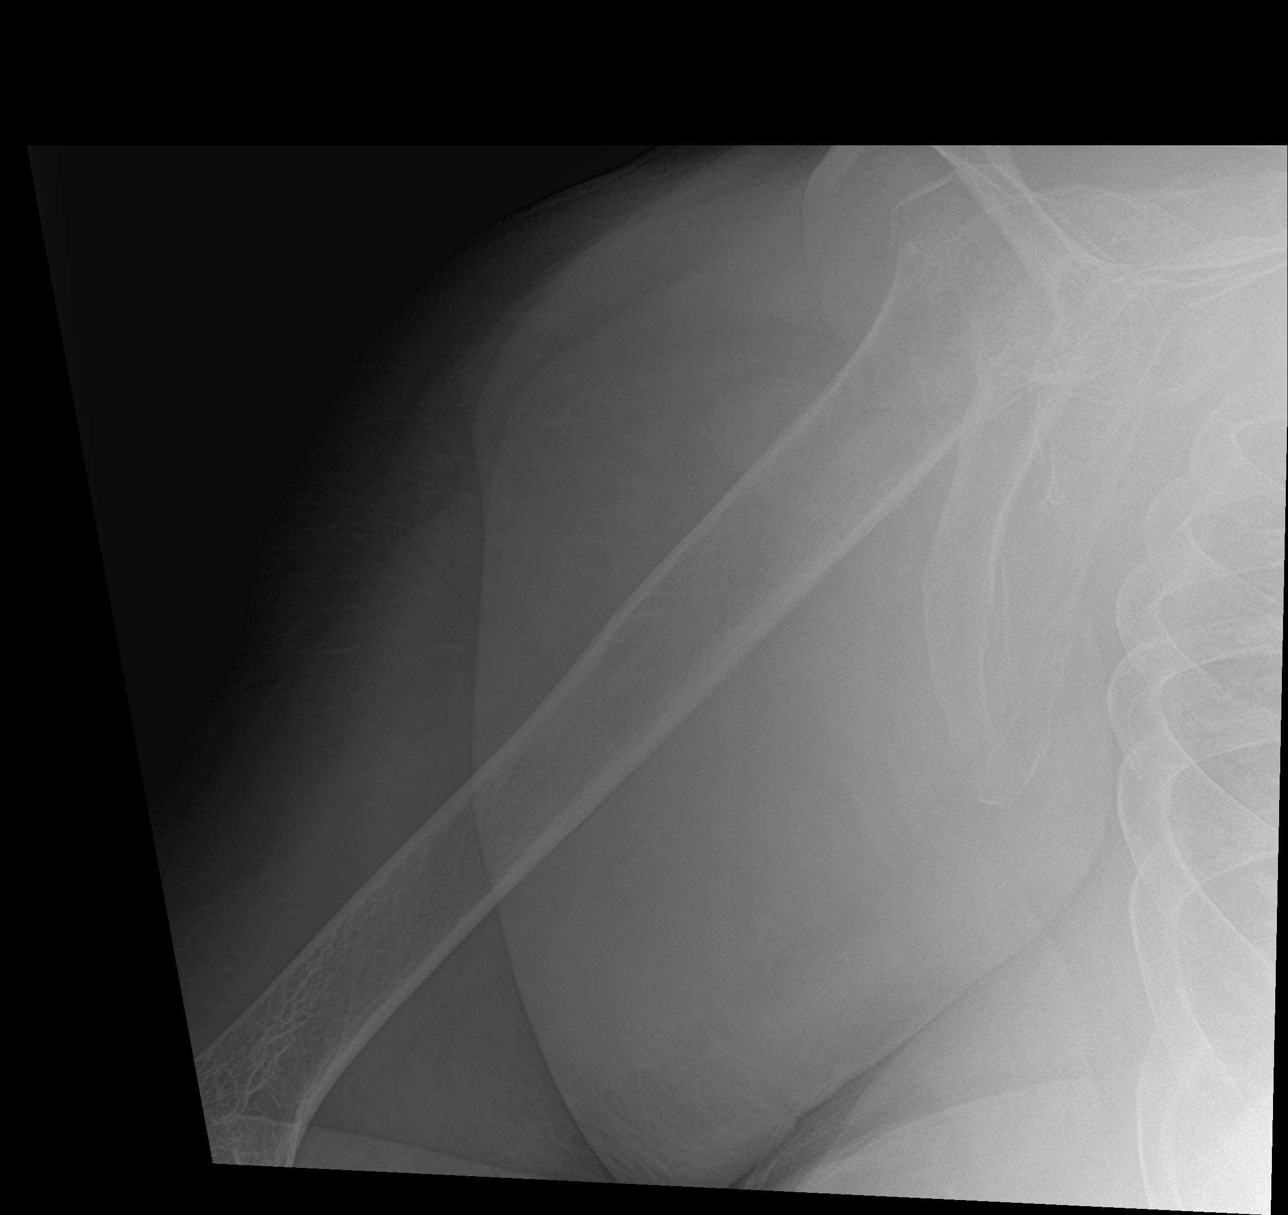

[shoulder ap neutral (2 of 2)]
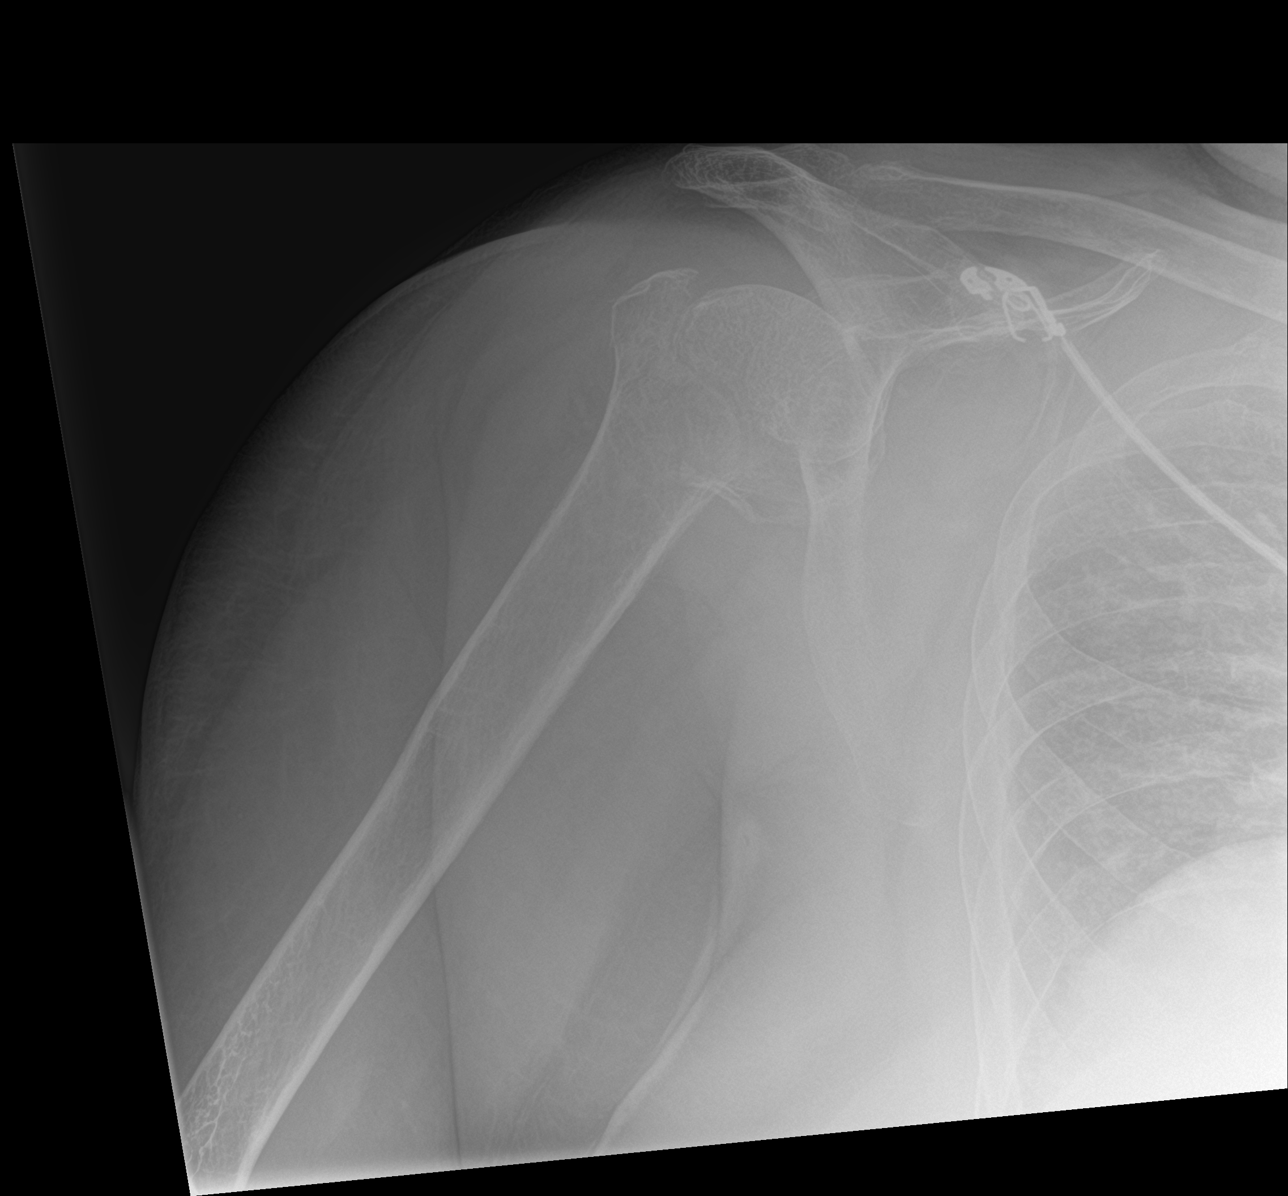

[3 of 3 positions shown; findings below may reference images not displayed]

FINDINGS: Acute oblique fracture extends through the surgical neck of the
right humerus. Associated mild superior subluxation of the humeral
shaft relative to the humeral head. The humeral head remains grossly
approximated with the glenoid. Glenoid itself grossly intact. AC
joint remains approximated and intact. Visualized soft tissues
demonstrate no acute finding. Visualized right hemithorax clear.
IMPRESSION: Acute mildly displaced oblique fracture through the neck of the
right humerus.

## 2020-05-01 IMAGING — CT CT ANGIO CHEST
2 of 8 series · 18 of 46 positions shown · IV contrast (iopamidol)
Comparison: Chest radiograph 12/25/2018, CT chest 08/01/2010

CLINICAL DATA: Bilateral leg swelling with shortness of breath

EXAM:
CT ANGIOGRAPHY CHEST WITH CONTRAST
TECHNIQUE: Multidetector CT imaging of the chest was performed using the
standard protocol during bolus administration of intravenous
contrast. Multiplanar CT image reconstructions and MIPs were
obtained to evaluate the vascular anatomy.
CONTRAST:  94 mL DI00ER-T9Z IOPAMIDOL (DI00ER-T9Z) INJECTION 76%

[Series 6: thins · axial · 0.80mm/px · z∈[+1157,+1357]mm · 15 of 222 slices shown]
[im 11/222  lung]
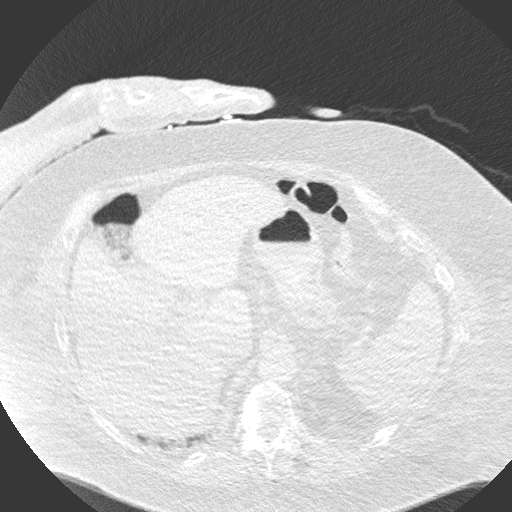
[im 31/222  soft-tissue]
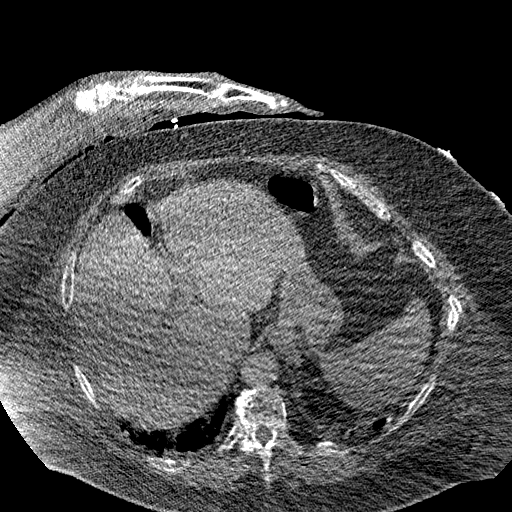
[im 41/222  lung]
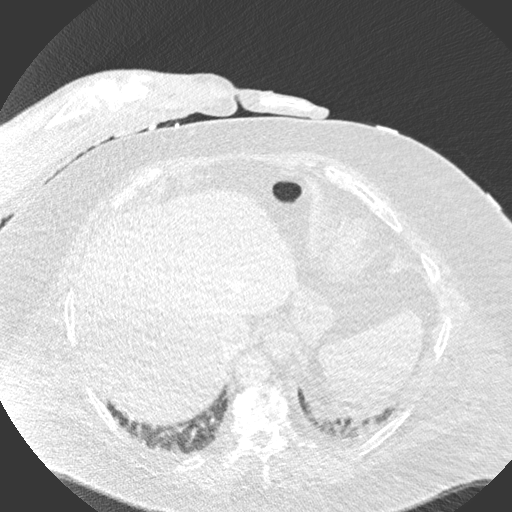
[im 51/222  soft-tissue]
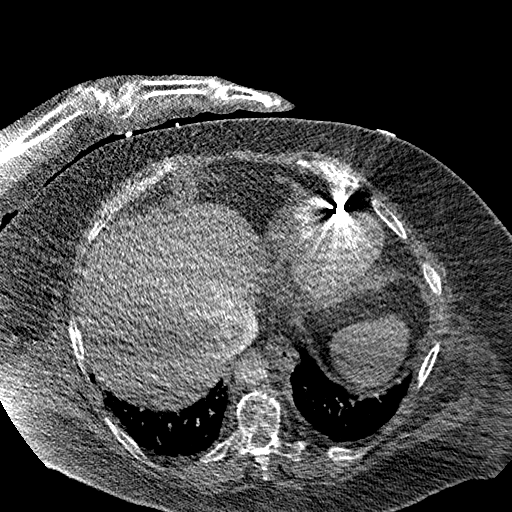
[im 71/222  lung]
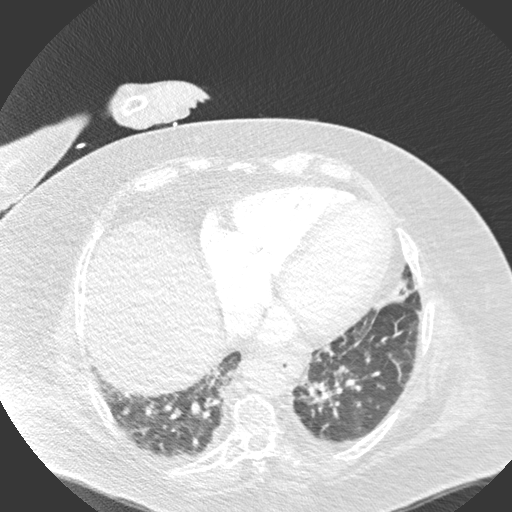
[im 81/222  soft-tissue]
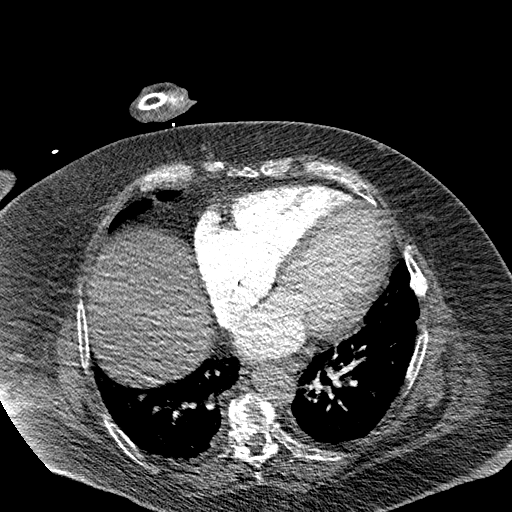
[im 101/222  lung]
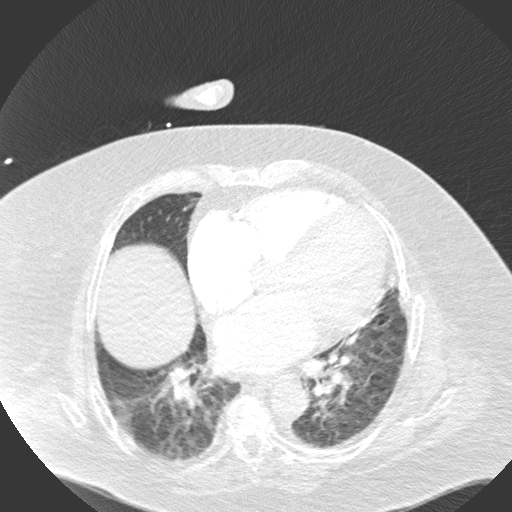
[im 111/222  soft-tissue]
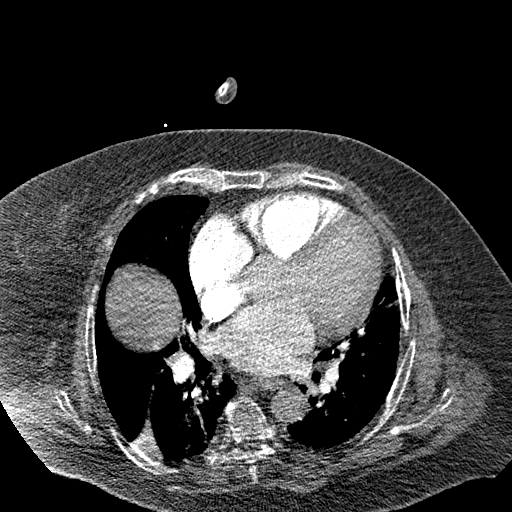
[im 121/222  lung]
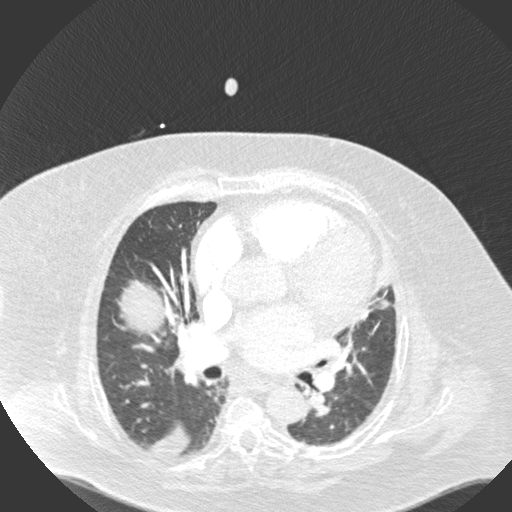
[im 141/222  soft-tissue]
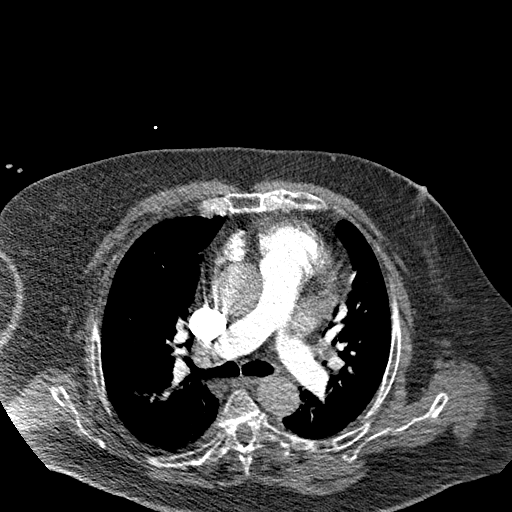
[im 151/222  lung]
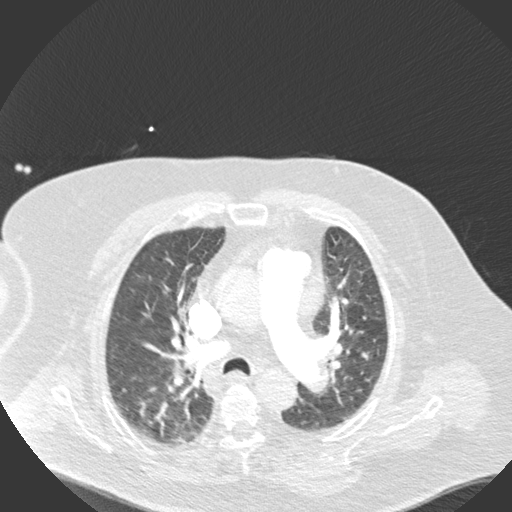
[im 171/222  soft-tissue]
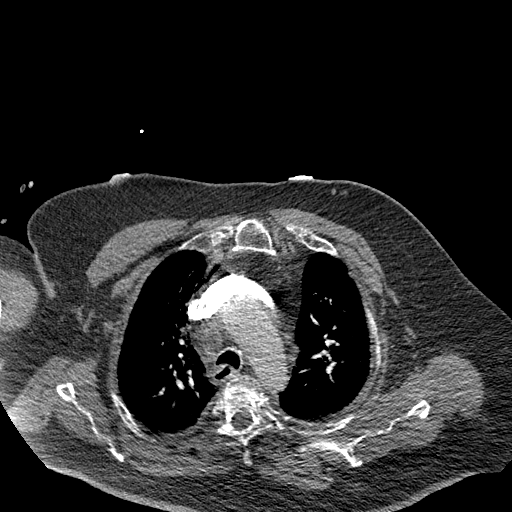
[im 181/222  lung]
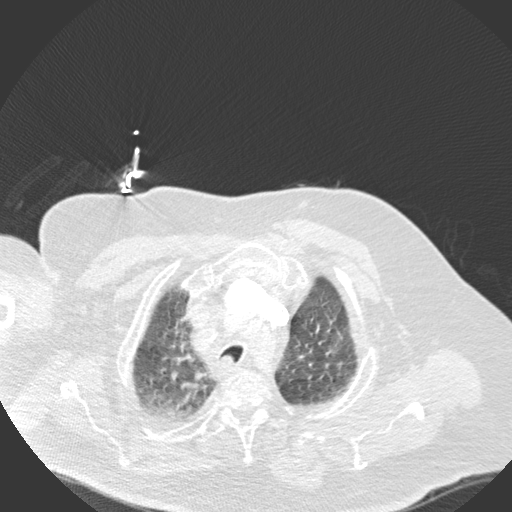
[im 191/222  soft-tissue]
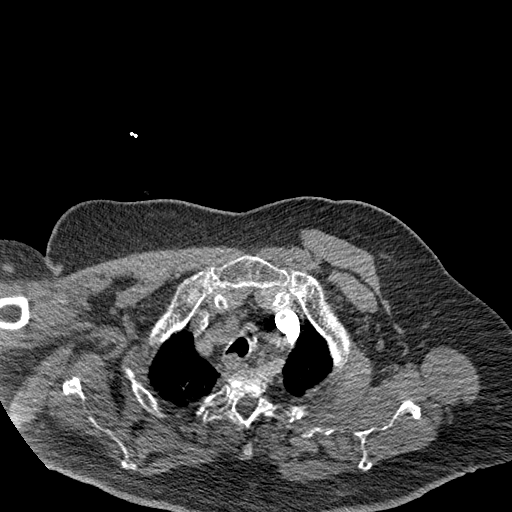
[im 211/222  lung]
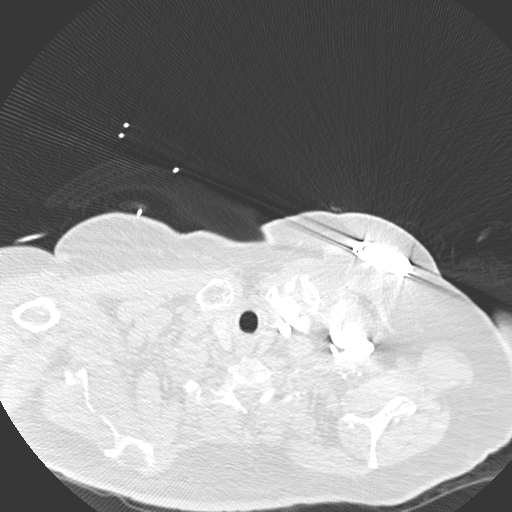

[Series 8: coronal mpr · coronal · 0.45mm/px · 3 of 151 slices shown]
[im 38/151  soft-tissue]
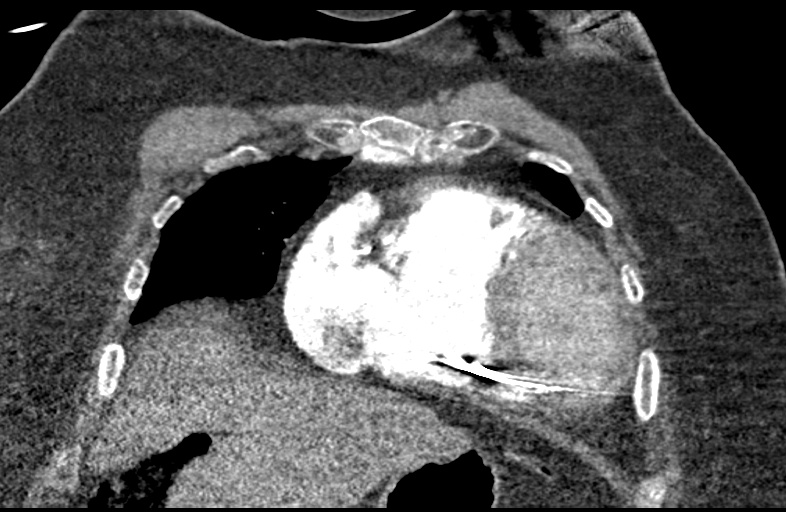
[im 76/151  soft-tissue]
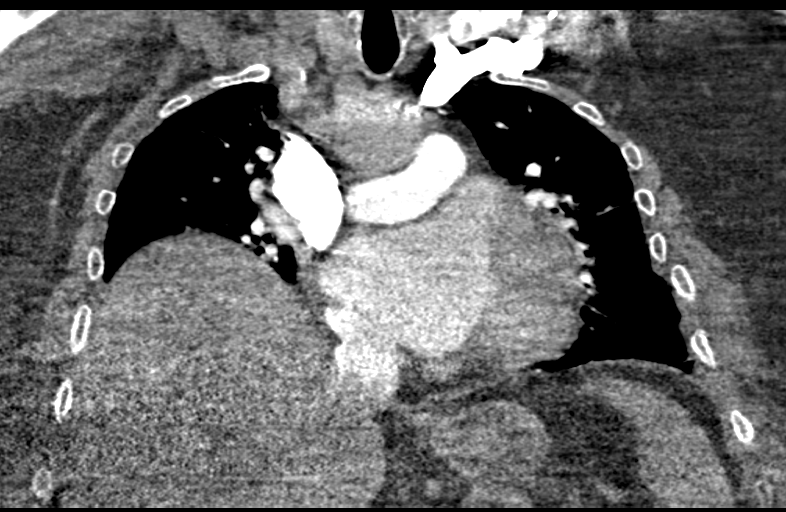
[im 113/151  soft-tissue]
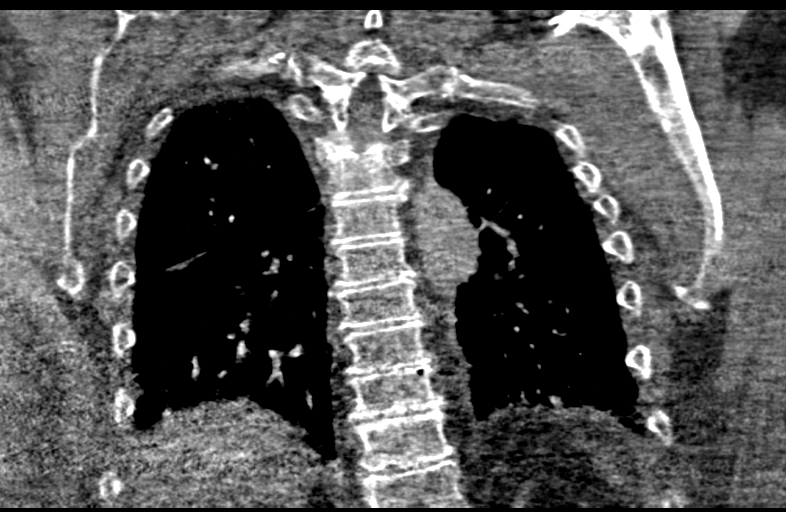

[18 of 46 positions shown; findings below may reference images not displayed]

FINDINGS: Cardiovascular: Motion degradation limits evaluation for acute
distal PE. No acute filling defects within the central or proximal
segmental vessels to suggest acute embolus. Mild cardiomegaly. No
significant pericardial effusion. Nonaneurysmal aorta. Coronary
vascular calcification.

Mediastinum/Nodes: Midline trachea. No thyroid mass. No significant
adenopathy. Small hiatal hernia

Lungs/Pleura: Lungs are clear. No pleural effusion or pneumothorax.

Upper Abdomen: No acute abnormality.

Musculoskeletal: Degenerative changes. No acute or suspicious
abnormality

Review of the MIP images confirms the above findings.
IMPRESSION: 1. Limited evaluation for distal emboli due to motion degradation.
No acute central PE is seen.
2. Mild cardiomegaly
3. Clear lung fields
